# Patient Record
Sex: Female | Born: 1965 | Race: White | Hispanic: No | Marital: Married | Smoking: Never smoker
Health system: Southern US, Community
[De-identification: ages and names within clinical notes are randomized; demographics above are authoritative.]

## PROBLEM LIST (undated history)

## (undated) DIAGNOSIS — E785 Hyperlipidemia, unspecified: Secondary | ICD-10-CM

## (undated) DIAGNOSIS — T4145XA Adverse effect of unspecified anesthetic, initial encounter: Secondary | ICD-10-CM

## (undated) DIAGNOSIS — B999 Unspecified infectious disease: Secondary | ICD-10-CM

## (undated) DIAGNOSIS — I10 Essential (primary) hypertension: Secondary | ICD-10-CM

## (undated) DIAGNOSIS — E119 Type 2 diabetes mellitus without complications: Secondary | ICD-10-CM

## (undated) DIAGNOSIS — M1990 Unspecified osteoarthritis, unspecified site: Secondary | ICD-10-CM

## (undated) DIAGNOSIS — K219 Gastro-esophageal reflux disease without esophagitis: Secondary | ICD-10-CM

## (undated) DIAGNOSIS — R0600 Dyspnea, unspecified: Secondary | ICD-10-CM

## (undated) DIAGNOSIS — D649 Anemia, unspecified: Secondary | ICD-10-CM

## (undated) HISTORY — DX: Essential (primary) hypertension: I10

## (undated) HISTORY — PX: CHOLECYSTECTOMY: SHX55

## (undated) HISTORY — DX: Hyperlipidemia, unspecified: E78.5

## (undated) HISTORY — DX: Type 2 diabetes mellitus without complications: E11.9

## (undated) HISTORY — PX: OTHER SURGICAL HISTORY: SHX169

---

## 1995-08-26 HISTORY — PX: TUBAL LIGATION: SHX77

## 2006-05-26 ENCOUNTER — Emergency Department: Payer: Self-pay | Admitting: Emergency Medicine

## 2007-03-04 ENCOUNTER — Inpatient Hospital Stay: Payer: Self-pay | Admitting: General Surgery

## 2009-05-27 ENCOUNTER — Inpatient Hospital Stay: Payer: Self-pay | Admitting: *Deleted

## 2010-01-21 ENCOUNTER — Emergency Department: Payer: Self-pay | Admitting: Emergency Medicine

## 2010-08-25 DIAGNOSIS — B999 Unspecified infectious disease: Secondary | ICD-10-CM

## 2010-08-25 HISTORY — DX: Unspecified infectious disease: B99.9

## 2011-02-07 ENCOUNTER — Ambulatory Visit: Payer: Self-pay

## 2011-04-22 ENCOUNTER — Ambulatory Visit: Payer: Self-pay | Admitting: Sports Medicine

## 2011-05-12 ENCOUNTER — Ambulatory Visit: Payer: Self-pay | Admitting: Anesthesiology

## 2011-05-14 ENCOUNTER — Ambulatory Visit: Payer: Self-pay | Admitting: Unknown Physician Specialty

## 2012-10-29 ENCOUNTER — Ambulatory Visit: Payer: Self-pay | Admitting: Internal Medicine

## 2012-10-29 LAB — CBC CANCER CENTER
Basophil %: 0.5 %
Eosinophil #: 0.3 x10 3/mm (ref 0.0–0.7)
Eosinophil %: 3.3 %
HGB: 9.7 g/dL — ABNORMAL LOW (ref 12.0–16.0)
Lymphocyte #: 2.8 x10 3/mm (ref 1.0–3.6)
Lymphocyte %: 27.1 %
MCH: 22.6 pg — ABNORMAL LOW (ref 26.0–34.0)
MCHC: 31.8 g/dL — ABNORMAL LOW (ref 32.0–36.0)
MCV: 71 fL — ABNORMAL LOW (ref 80–100)
Monocyte #: 0.6 x10 3/mm (ref 0.2–0.9)
Monocyte %: 6.1 %
Neutrophil #: 6.6 x10 3/mm — ABNORMAL HIGH (ref 1.4–6.5)
Platelet: 528 x10 3/mm — ABNORMAL HIGH (ref 150–440)
RBC: 4.28 10*6/uL (ref 3.80–5.20)
RDW: 16.5 % — ABNORMAL HIGH (ref 11.5–14.5)

## 2012-10-29 LAB — RETICULOCYTES
Absolute Retic Count: 0.0727 10*6/uL
Reticulocyte: 1.7 %

## 2012-10-29 LAB — CALCIUM: Calcium, Total: 8.9 mg/dL (ref 8.5–10.1)

## 2012-11-23 ENCOUNTER — Ambulatory Visit: Payer: Self-pay | Admitting: Internal Medicine

## 2015-08-01 ENCOUNTER — Other Ambulatory Visit: Payer: Self-pay | Admitting: Family Medicine

## 2015-08-31 ENCOUNTER — Other Ambulatory Visit: Payer: Self-pay | Admitting: Family Medicine

## 2015-08-31 ENCOUNTER — Ambulatory Visit: Payer: Self-pay | Admitting: Family Medicine

## 2015-08-31 MED ORDER — METOPROLOL TARTRATE 25 MG PO TABS: 25.0000 mg | ORAL_TABLET | Freq: Two times a day (BID) | ORAL | Status: DC

## 2015-08-31 MED ORDER — METFORMIN HCL ER (OSM) 1000 MG PO TB24: 1000.0000 mg | ORAL_TABLET | Freq: Two times a day (BID) | ORAL | Status: DC

## 2015-08-31 MED ORDER — FLUOXETINE HCL 40 MG PO CAPS: 40.0000 mg | ORAL_CAPSULE | Freq: Every day | ORAL | Status: DC

## 2015-08-31 MED ORDER — GLIMEPIRIDE 4 MG PO TABS: 8.0000 mg | ORAL_TABLET | Freq: Every day | ORAL | Status: DC

## 2015-08-31 MED ORDER — LISINOPRIL 20 MG PO TABS: 20.0000 mg | ORAL_TABLET | Freq: Every day | ORAL | Status: DC

## 2015-09-05 ENCOUNTER — Ambulatory Visit (INDEPENDENT_AMBULATORY_CARE_PROVIDER_SITE_OTHER): Payer: BC Managed Care – PPO | Admitting: Family Medicine

## 2015-09-05 VITALS — BP 122/85 | HR 86 | Temp 98.4°F | Resp 16 | Ht 66.0 in | Wt 198.0 lb

## 2015-09-05 DIAGNOSIS — E1165 Type 2 diabetes mellitus with hyperglycemia: Secondary | ICD-10-CM | POA: Diagnosis not present

## 2015-09-05 DIAGNOSIS — G4700 Insomnia, unspecified: Secondary | ICD-10-CM | POA: Insufficient documentation

## 2015-09-05 DIAGNOSIS — I10 Essential (primary) hypertension: Secondary | ICD-10-CM

## 2015-09-05 DIAGNOSIS — E1142 Type 2 diabetes mellitus with diabetic polyneuropathy: Secondary | ICD-10-CM

## 2015-09-05 DIAGNOSIS — IMO0002 Reserved for concepts with insufficient information to code with codable children: Secondary | ICD-10-CM

## 2015-09-05 DIAGNOSIS — H8110 Benign paroxysmal vertigo, unspecified ear: Secondary | ICD-10-CM | POA: Insufficient documentation

## 2015-09-05 DIAGNOSIS — E1140 Type 2 diabetes mellitus with diabetic neuropathy, unspecified: Secondary | ICD-10-CM | POA: Insufficient documentation

## 2015-09-05 DIAGNOSIS — H8113 Benign paroxysmal vertigo, bilateral: Secondary | ICD-10-CM

## 2015-09-05 LAB — POCT UA - MICROALBUMIN
ALBUMIN/CREATININE RATIO, URINE, POC: 0
CREATININE, POC: 0 mg/dL
Microalbumin Ur, POC: 0 mg/L

## 2015-09-05 LAB — POCT GLYCOSYLATED HEMOGLOBIN (HGB A1C): Hemoglobin A1C: 10.4

## 2015-09-05 MED ORDER — INSULIN DETEMIR 100 UNIT/ML FLEXPEN
10.0000 [IU] | PEN_INJECTOR | Freq: Every day | SUBCUTANEOUS | Status: DC
Start: 2015-09-05 — End: 2015-09-07

## 2015-09-05 MED ORDER — BLOOD GLUCOSE MONITOR KIT: PACK | Status: AC

## 2015-09-05 MED ORDER — MECLIZINE HCL 32 MG PO TABS: 32.0000 mg | ORAL_TABLET | Freq: Three times a day (TID) | ORAL | Status: DC | PRN

## 2015-09-05 MED ORDER — MELATONIN 3 MG PO CAPS: 2.0000 | ORAL_CAPSULE | Freq: Every day | ORAL | Status: DC

## 2015-09-05 MED ORDER — JARDIANCE 25 MG PO TABS: 25.0000 mg | ORAL_TABLET | Freq: Every day | ORAL | Status: DC

## 2015-09-05 MED ORDER — INSULIN PEN NEEDLE 29G X 12.7MM MISC
1.0000 | Freq: Every day | Status: DC
Start: 2015-09-05 — End: 2015-12-18

## 2015-09-05 NOTE — Assessment & Plan Note (Signed)
Controlled. Continue ACE inhibitor for renal protection. Check CMET today.

## 2015-09-05 NOTE — Assessment & Plan Note (Signed)
Symptoms consistent with BPPV. Trial of meclizine PRN for symptoms.

## 2015-09-05 NOTE — Assessment & Plan Note (Signed)
Discussed sleep hygiene. Encouraged pt to try OTC melatonin to see if nighttime awakenings can be improved.  Removed phone, TV, anything with glow from bedroom.

## 2015-09-05 NOTE — Progress Notes (Signed)
Subjective:    Patient ID: Patricia [name], female    DOB: October 08, 1965, 50 y.o.   MRN: [id number]  HPI: Patricia [name] is a 50 y.o. female presenting on 09/05/2015 for Diabetes   HPI  Pt presents for diabetes and hypertension follow-up. Feels pretty good. Occasional dizzy spells with position changes. When she turns her head she feels as though she is on a tilt a whorl. Happens a few times a month. Increased with cold.  Diabetes: Taking metformin twice daily BID.  And Jardiance to control A1c. Does not check sugars. A1c is 10.1 % today. Nothing changed in diet and exercise. Does not go to gym regularly. Does not drink sugar sweetened beverages or sodas.  Hypertension: Controlled. No chest pain, no visual changes,  Does not check at home.  Disordered sleep- Goes to bed at 12 or 1. Wakes up in the middle of the night and cannot fall asleep. Has always had trouble sleeping. Has not tried any OTC sleep aids.    No past medical history on file.  Current Outpatient Prescriptions on File Prior to Visit  Medication Sig  . FLUoxetine (PROZAC) 40 MG capsule Take 1 capsule (40 mg total) by mouth daily.  Marland Kitchen glimepiride (AMARYL) 4 MG tablet Take 2 tablets (8 mg total) by mouth daily with breakfast.  . lisinopril (PRINIVIL,ZESTRIL) 20 MG tablet Take 1 tablet (20 mg total) by mouth daily.  . metformin (FORTAMET) 1000 MG (OSM) 24 hr tablet Take 1 tablet (1,000 mg total) by mouth 2 (two) times daily.  . metoprolol tartrate (LOPRESSOR) 25 MG tablet Take 1 tablet (25 mg total) by mouth 2 (two) times daily.   No current facility-administered medications on file prior to visit.    Review of Systems  Constitutional: Negative for fever and chills.  HENT: Negative.   Respiratory: Negative for cough, chest tightness and wheezing.   Cardiovascular: Negative for chest pain and leg swelling.  Gastrointestinal: Negative for nausea, vomiting, abdominal pain, diarrhea and constipation.  Endocrine: Negative.   Negative for cold intolerance, heat intolerance, polydipsia, polyphagia and polyuria.  Genitourinary: Negative for dysuria and difficulty urinating.  Musculoskeletal: Negative.   Neurological: Positive for dizziness and numbness (with paraesthesias toes. ). Negative for light-headedness.  Psychiatric/Behavioral: Positive for sleep disturbance.   Per HPI unless specifically indicated above     Objective:    BP 122/85 mmHg  Pulse 86  Temp(Src) 98.4 F (36.9 C) (Oral)  Resp 16  Ht '5\' 6"'$  (1.676 m)  Wt 198 lb (89.812 kg)  BMI 31.97 kg/m2  LMP   Wt Readings from Last 3 Encounters:  09/05/15 198 lb (89.812 kg)    Physical Exam  Constitutional: She is oriented to person, place, and time. She appears well-developed and well-nourished.  HENT:  Head: Normocephalic and atraumatic.  Neck: Neck supple.  Cardiovascular: Normal rate, regular rhythm and normal heart sounds.  Exam reveals no gallop and no friction rub.   No murmur heard. Pulmonary/Chest: Effort normal and breath sounds normal. She has no wheezes. She exhibits no tenderness.  Abdominal: Soft. Normal appearance and bowel sounds are normal. She exhibits no distension and no mass. There is no tenderness. There is no rebound and no guarding.  Musculoskeletal: Normal range of motion. She exhibits no edema or tenderness.  Lymphadenopathy:    She has no cervical adenopathy.  Neurological: She is alert and oriented to person, place, and time.  Skin: Skin is warm and dry.  Psychiatric: She has a normal  mood and affect. Her behavior is normal. Thought content normal.   Diabetic Foot Exam - Simple   Simple Foot Form  Diabetic Foot exam was performed with the following findings:  Yes 09/05/2015  9:10 AM  Visual Inspection  No deformities, no ulcerations, no other skin breakdown bilaterally:  Yes  Sensation Testing  Intact to touch and monofilament testing bilaterally:  Yes  Pulse Check  Posterior Tibialis and Dorsalis pulse intact  bilaterally:  Yes  Comments      Results for orders placed or performed in visit on 09/05/15  POCT HgB A1C  Result Value Ref Range   Hemoglobin A1C 10.4       Assessment & Plan:   Problem List Items Addressed This Visit      Cardiovascular and Mediastinum   Hypertension    Controlled. Continue ACE inhibitor for renal protection. Check CMET today.         Endocrine   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 polyneuropathy, without long-term current use of insulin ([place]) - Primary    Uncontrolled. A1c at 10.4% today. Discussed starting insulin with patient. Start Levemir 10 units at bedtime nightly. Pt shown on demo pen. Monitor sugars closely.  Check CMET, Lipids.  Pt should be on statin, will discuss next visit.  UA microalbumin ordered today.  Foot exam done.  Referral for eye exam will be placed next month.      Relevant Medications   Insulin Detemir (LEVEMIR) 100 UNIT/ML Pen   Insulin Pen Needle 29G X 12.7MM MISC   JARDIANCE 25 MG TABS tablet   Other Relevant Orders   POCT HgB A1C (Completed)   POCT UA - Microalbumin   Comprehensive metabolic panel   Lipid panel     Nervous and Auditory   BPPV (benign paroxysmal positional vertigo)    Symptoms consistent with BPPV. Trial of meclizine PRN for symptoms.         Other   Insomnia    Discussed sleep hygiene. Encouraged pt to try OTC melatonin to see if nighttime awakenings can be improved.  Removed phone, TV, anything with glow from bedroom.          Meds ordered this encounter  Medications  . DISCONTD: JARDIANCE 25 MG TABS tablet    Sig:   . Insulin Detemir (LEVEMIR) 100 UNIT/ML Pen    Sig: Inject 10 Units into the skin daily at 10 pm.    Dispense:  15 mL    Refill:  11    Order Specific Question:  Supervising Provider    Answer:  Arlis Porta [phone]  . Insulin Pen Needle 29G X 12.7MM MISC    Sig: 1 each by Does not apply route daily.    Dispense:  90 each    Refill:  3    Order  Specific Question:  Supervising Provider    Answer:  Arlis Porta [phone]  . blood glucose meter kit and supplies KIT    Sig: Dispense based on patient and insurance preference. Use up to four times daily as directed. (FOR ICD-9 250.00, 250.01).    Dispense:  1 each    Refill:  0    Order Specific Question:  Supervising Provider    Answer:  Arlis Porta [[id number]]    Order Specific Question:  Number of strips    Answer:  80    Order Specific Question:  Number of lancets    Answer:  90  .  meclizine (ANTIVERT) 32 MG tablet    Sig: Take 1 tablet (32 mg total) by mouth 3 (three) times daily as needed.    Dispense:  30 tablet    Refill:  0    Order Specific Question:  Supervising Provider    Answer:  Arlis Porta [phone]  . Melatonin 3 MG CAPS    Sig: Take 2 capsules (6 mg total) by mouth daily.    Refill:  0    Order Specific Question:  Supervising Provider    Answer:  Arlis Porta [[id number]]  . JARDIANCE 25 MG TABS tablet    Sig: Take 25 mg by mouth daily.    Dispense:  30 tablet    Refill:  11    Order Specific Question:  Supervising Provider    Answer:  Arlis Porta [phone]      Follow up plan: Return in about 4 weeks (around 10/03/2015) for diabetes.Marland Kitchen

## 2015-09-05 NOTE — Patient Instructions (Signed)
We need to start you on insulin today for your diabetes. We will start with levemir injections at bedtime- take 10 units under the skin. Ensure you are eating regular meals. We will also [facility] for a diabetes refresher since you are starting on insuling.   Please check your blood glucose once times daily. If your glucose is < 70 mg/dl or you have symptoms of hypoglycemia confusion, dizziness, headache, hunger, jitteriness and sweating please drink 4 oz of juice or soda.  Check blood glucose 15 minutes later. If it has not risen to >100, please seek medical attention. If > 100 please eat a snack containing protein such as peanut butter and crackers.   For sleep try melatonin OTC 6-10 mg at bedtime.

## 2015-09-05 NOTE — Assessment & Plan Note (Signed)
Uncontrolled. A1c at 10.4% today. Discussed starting insulin with patient. Start Levemir 10 units at bedtime nightly. Pt shown on demo pen. Monitor sugars closely.  Check CMET, Lipids.  Pt should be on statin, will discuss next visit.  UA microalbumin ordered today.  Foot exam done.  Referral for eye exam will be placed next month.

## 2015-09-06 LAB — LIPID PANEL
CHOL/HDL RATIO: 3.8 ratio (ref 0.0–4.4)
Cholesterol, Total: 218 mg/dL — ABNORMAL HIGH (ref 100–199)
HDL: 57 mg/dL (ref >39)
LDL Calculated: 114 mg/dL — ABNORMAL HIGH (ref 0–99)
Triglycerides: 234 mg/dL — ABNORMAL HIGH (ref 0–149)
VLDL CHOLESTEROL CAL: 47 mg/dL — AB (ref 5–40)

## 2015-09-06 LAB — COMPREHENSIVE METABOLIC PANEL
A/G RATIO: 1.6 (ref 1.1–2.5)
ALBUMIN: 4.3 g/dL (ref 3.5–5.5)
ALT: 33 IU/L — AB (ref 0–32)
AST: 22 IU/L (ref 0–40)
Alkaline Phosphatase: 99 IU/L (ref 39–117)
BILIRUBIN TOTAL: 0.2 mg/dL (ref 0.0–1.2)
BUN / CREAT RATIO: 15 (ref 9–23)
BUN: 12 mg/dL (ref 6–24)
CALCIUM: 9.8 mg/dL (ref 8.7–10.2)
CHLORIDE: 95 mmol/L — AB (ref 96–106)
CO2: 23 mmol/L (ref 18–29)
Creatinine, Ser: 0.78 mg/dL (ref 0.57–1.00)
GFR, EST AFRICAN AMERICAN: 102 mL/min/{1.73_m2} (ref >59)
GFR, EST NON AFRICAN AMERICAN: 88 mL/min/{1.73_m2} (ref >59)
GLUCOSE: 232 mg/dL — AB (ref 65–99)
Globulin, Total: 2.7 g/dL (ref 1.5–4.5)
Potassium: 5.4 mmol/L — ABNORMAL HIGH (ref 3.5–5.2)
Sodium: 137 mmol/L (ref 134–144)
TOTAL PROTEIN: 7 g/dL (ref 6.0–8.5)

## 2015-09-07 ENCOUNTER — Telehealth: Payer: Self-pay | Admitting: Family Medicine

## 2015-09-07 ENCOUNTER — Other Ambulatory Visit: Payer: Self-pay | Admitting: Family Medicine

## 2015-09-07 DIAGNOSIS — IMO0002 Reserved for concepts with insufficient information to code with codable children: Secondary | ICD-10-CM

## 2015-09-07 DIAGNOSIS — E1165 Type 2 diabetes mellitus with hyperglycemia: Principal | ICD-10-CM

## 2015-09-07 DIAGNOSIS — E1142 Type 2 diabetes mellitus with diabetic polyneuropathy: Secondary | ICD-10-CM

## 2015-09-07 MED ORDER — INSULIN GLARGINE 300 UNIT/ML ~~LOC~~ SOPN: 10.0000 [IU] | PEN_INJECTOR | Freq: Every day | SUBCUTANEOUS | Status: DC

## 2015-09-07 NOTE — Telephone Encounter (Signed)
Pt has a coupon for $10 Toujeo co-pay would like to switch her insulin.

## 2015-09-11 ENCOUNTER — Other Ambulatory Visit: Payer: Self-pay | Admitting: Family Medicine

## 2015-09-11 MED ORDER — FUROSEMIDE 20 MG PO TABS: 10.0000 mg | ORAL_TABLET | Freq: Every day | ORAL | Status: DC

## 2015-09-19 ENCOUNTER — Telehealth: Payer: Self-pay | Admitting: Family Medicine

## 2015-09-19 NOTE — Telephone Encounter (Signed)
Called patient back to confirm meds and bs readings. Patient taking Metformin  1 bid; Jardiance 25 mg 1-qd; Toujeo 10 units qhs. 09/16/15 am- 286 pm 260 09/17/15  am- 260 pm 362 09/17/15  Am 305  pm 336 09/19/15  Am 225 Please advise.

## 2015-09-19 NOTE — Telephone Encounter (Signed)
Patient aware of direction and verbalizes understanding.

## 2015-09-19 NOTE — Telephone Encounter (Signed)
Increase Toujeo to 12 units nightly tonight; Increase by 2 units at bedtime for every for an AM (fasting blood sugar) of > 150. For exam if tomorrrow's fasting sugar is 220- increase up to 14 units of Toujeo that night. We will check in on her sugars at her 10/05/2015 appt.  Thanks! AK

## 2015-09-19 NOTE — Telephone Encounter (Signed)
Pt said her blood sugar has been running for 250 to 350 and asked if she needed to increase medication.  Please call [phone]

## 2015-10-05 ENCOUNTER — Encounter: Payer: Self-pay | Admitting: Family Medicine

## 2015-10-05 ENCOUNTER — Ambulatory Visit (INDEPENDENT_AMBULATORY_CARE_PROVIDER_SITE_OTHER): Payer: BC Managed Care – PPO | Admitting: Family Medicine

## 2015-10-05 VITALS — BP 101/72 | HR 96 | Temp 97.8°F | Resp 16 | Ht 66.0 in | Wt 199.9 lb

## 2015-10-05 DIAGNOSIS — E875 Hyperkalemia: Secondary | ICD-10-CM | POA: Diagnosis not present

## 2015-10-05 DIAGNOSIS — E1142 Type 2 diabetes mellitus with diabetic polyneuropathy: Secondary | ICD-10-CM

## 2015-10-05 DIAGNOSIS — IMO0002 Reserved for concepts with insufficient information to code with codable children: Secondary | ICD-10-CM

## 2015-10-05 DIAGNOSIS — E1165 Type 2 diabetes mellitus with hyperglycemia: Secondary | ICD-10-CM

## 2015-10-05 DIAGNOSIS — R740 Nonspecific elevation of levels of transaminase and lactic acid dehydrogenase [LDH]: Secondary | ICD-10-CM | POA: Diagnosis not present

## 2015-10-05 DIAGNOSIS — R7401 Elevation of levels of liver transaminase levels: Secondary | ICD-10-CM

## 2015-10-05 NOTE — Patient Instructions (Addendum)
Diabetes:  We will have you seen by the diabetes educators at the hospital for help with your diet. Titrate Toujeo 2 units every 7 days for fasting blood sugar >130.   STOP Jardiance. Test blood sugar 2 hours after a meal.   Alpha Lipoic Acid-  once daily to help with feet burning.

## 2015-10-05 NOTE — Assessment & Plan Note (Addendum)
Titrate Toujeo 2 units once weekly with goal of FBG <130. Encouraged healthy eating and carb counting. [facility] to [facility] for diabetic re-education. Cotinue to check sugars twice daily.  Hypoglycemia protocol reviewed. Eye exam next month.  Plan to check A1c in 2 mos. Consider switching to [facility] to better to control diabetes.  Suggested gabapentin for feet numbness. Pt declined. Pt will look into alpha lipoic acid.

## 2015-10-05 NOTE — Progress Notes (Signed)
Subjective:    Patient ID: Patricia [name], female    DOB: 10-Jan-1966, 50 y.o.   MRN: [id number]  HPI: Patricia [name] is a 50 y.o. female presenting on 10/05/2015 for Diabetes   HPI  Pt presents for diabetes follow-up.  She was started [employer]. Titrating 2 units per day. Up to 24 units daily.  Avg is 237. Highest of 430 and lowest of 141.  Taking Toujeo at bedtime. Pt evening sugars are high- not testing 2 hours after meals.  Feet are numb and tingling. Painful when walking barefoot. No visual changes. No hypoglycemia.  She is a picky eater and has trouble making food choices.   Past Medical History  Diagnosis Date  . Diabetes mellitus without complication ([place])   . Hypertension   . Hyperlipidemia     Current Outpatient Prescriptions on File Prior to Visit  Medication Sig  . blood glucose meter kit and supplies KIT Dispense based on patient and insurance preference. Use up to four times daily as directed. (FOR ICD-9 250.00, 250.01).  Marland Kitchen FLUoxetine (PROZAC) 40 MG capsule Take 1 capsule (40 mg total) by mouth daily.  . furosemide (LASIX) 20 MG tablet Take 0.5 tablets (10 mg total) by mouth daily. For 1 week.  Marland Kitchen glimepiride (AMARYL) 4 MG tablet Take 2 tablets (8 mg total) by mouth daily with breakfast.  . Insulin Glargine (TOUJEO SOLOSTAR) 300 UNIT/ML SOPN Inject 10 Units into the skin at bedtime.  . Insulin Pen Needle 29G X 12.7MM MISC 1 each by Does not apply route daily.  Marland Kitchen lisinopril (PRINIVIL,ZESTRIL) 20 MG tablet Take 1 tablet (20 mg total) by mouth daily.  . meclizine (ANTIVERT) 32 MG tablet Take 1 tablet (32 mg total) by mouth 3 (three) times daily as needed.  . Melatonin 3 MG CAPS Take 2 capsules (6 mg total) by mouth daily.  . metformin (FORTAMET) 1000 MG (OSM) 24 hr tablet Take 1 tablet (1,000 mg total) by mouth 2 (two) times daily.  . metoprolol tartrate (LOPRESSOR) 25 MG tablet Take 1 tablet (25 mg total) by mouth 2 (two) times daily.   No current facility-administered  medications on file prior to visit.    Review of Systems  Constitutional: Negative for fever and chills.  HENT: Negative.   Respiratory: Negative for cough, chest tightness and wheezing.   Cardiovascular: Negative for chest pain and leg swelling.  Gastrointestinal: Negative for nausea, vomiting, abdominal pain, diarrhea and constipation.  Endocrine: Negative.  Negative for cold intolerance, heat intolerance, polydipsia, polyphagia and polyuria.  Genitourinary: Negative for dysuria and difficulty urinating.  Musculoskeletal: Negative.   Neurological: Positive for numbness (bilateral feet). Negative for dizziness and light-headedness.  Psychiatric/Behavioral: Negative.    Per HPI unless specifically indicated above     Objective:    BP 101/72 mmHg  Pulse 96  Temp(Src) 97.8 F (36.6 C) (Oral)  Resp 16  Ht '5\' 6"'$  (1.676 m)  Wt 199 lb 14.4 oz (90.674 kg)  BMI 32.28 kg/m2  Wt Readings from Last 3 Encounters:  10/05/15 199 lb 14.4 oz (90.674 kg)  09/05/15 198 lb (89.812 kg)    Physical Exam  Constitutional: She is oriented to person, place, and time. She appears well-developed and well-nourished.  HENT:  Head: Normocephalic and atraumatic.  Neck: Neck supple.  Cardiovascular: Normal rate, regular rhythm and normal heart sounds.  Exam reveals no gallop and no friction rub.   No murmur heard. Pulmonary/Chest: Effort normal and breath sounds normal. She has no wheezes.  She exhibits no tenderness.  Abdominal: Soft. Normal appearance and bowel sounds are normal. She exhibits no distension and no mass. There is no tenderness. There is no rebound and no guarding.  Musculoskeletal: Normal range of motion. She exhibits no edema or tenderness.  Lymphadenopathy:    She has no cervical adenopathy.  Neurological: She is alert and oriented to person, place, and time.  Skin: Skin is warm and dry.   Results for orders placed or performed in visit on 09/05/15  Comprehensive metabolic panel    Result Value Ref Range   Glucose 232 (H) 65 - 99 mg/dL   BUN 12 6 - 24 mg/dL   Creatinine, Ser 6.12 0.57 - 1.00 mg/dL   GFR calc non Af Amer 88 >59 mL/min/1.73   GFR calc Af Amer 102 >59 mL/min/1.73   BUN/Creatinine Ratio 15 9 - 23   Sodium 137 134 - 144 mmol/L   Potassium 5.4 (H) 3.5 - 5.2 mmol/L   Chloride 95 (L) 96 - 106 mmol/L   CO2 23 18 - 29 mmol/L   Calcium 9.8 8.7 - 10.2 mg/dL   Total Protein 7.0 6.0 - 8.5 g/dL   Albumin 4.3 3.5 - 5.5 g/dL   Globulin, Total 2.7 1.5 - 4.5 g/dL   Albumin/Globulin Ratio 1.6 1.1 - 2.5   Bilirubin Total 0.2 0.0 - 1.2 mg/dL   Alkaline Phosphatase 99 39 - 117 IU/L   AST 22 0 - 40 IU/L   ALT 33 (H) 0 - 32 IU/L  Lipid panel  Result Value Ref Range   Cholesterol, Total 218 (H) 100 - 199 mg/dL   Triglycerides 548 (H) 0 - 149 mg/dL   HDL 57 >32 mg/dL   VLDL Cholesterol Cal 47 (H) 5 - 40 mg/dL   LDL Calculated 346 (H) 0 - 99 mg/dL   Chol/HDL Ratio 3.8 0.0 - 4.4 ratio units  POCT HgB A1C  Result Value Ref Range   Hemoglobin A1C 10.4   POCT UA - Microalbumin  Result Value Ref Range   Microalbumin Ur, POC 0 mg/L   Creatinine, POC 0 mg/dL   Albumin/Creatinine Ratio, Urine, POC 0       Assessment & Plan:   Problem List Items Addressed This Visit      Endocrine   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 polyneuropathy, without long-term current use of insulin ([facility])    Titrate Toujeo 2 units once weekly with goal of FBG <130. Encouraged healthy eating and carb counting. [facility] to [facility] for diabetic re-education. Cotinue to check sugars twice daily.  Hypoglycemia protocol reviewed. Eye exam next month.  Plan to check A1c in 2 mos. Consider switching to [facility] to better to control diabetes.  Suggested gabapentin for feet numbness. Pt declined. Pt will look into alpha lipoic acid.        Other Visit Diagnoses    Hyperkalemia    -  Primary    Recheck from previous labs.     Relevant Orders    Comprehensive metabolic  panel    Elevated transaminase level        Recheck from previous labs.     Relevant Orders    Comprehensive metabolic panel       Meds ordered this encounter  Medications  . ONE TOUCH ULTRA TEST test strip    Sig:   . ONETOUCH DELICA LANCETS 33G MISC    Sig:       Follow up plan: No Follow-up on file.

## 2015-10-07 ENCOUNTER — Other Ambulatory Visit: Payer: Self-pay | Admitting: Family Medicine

## 2015-10-24 DIAGNOSIS — M25561 Pain in right knee: Secondary | ICD-10-CM | POA: Insufficient documentation

## 2015-10-30 ENCOUNTER — Other Ambulatory Visit: Payer: Self-pay | Admitting: Unknown Physician Specialty

## 2015-10-30 DIAGNOSIS — M1711 Unilateral primary osteoarthritis, right knee: Secondary | ICD-10-CM

## 2015-11-02 ENCOUNTER — Ambulatory Visit
Admission: RE | Admit: 2015-11-02 | Discharge: 2015-11-02 | Disposition: A | Discharge status: Home / Self Care | Payer: BC Managed Care – PPO | Source: Ambulatory Visit | Attending: Unknown Physician Specialty | Admitting: Unknown Physician Specialty

## 2015-11-02 DIAGNOSIS — M25461 Effusion, right knee: Secondary | ICD-10-CM | POA: Diagnosis not present

## 2015-11-02 DIAGNOSIS — M2410 Other articular cartilage disorders, unspecified site: Secondary | ICD-10-CM | POA: Diagnosis not present

## 2015-11-02 DIAGNOSIS — X58XXXA Exposure to other specified factors, initial encounter: Secondary | ICD-10-CM | POA: Diagnosis not present

## 2015-11-02 DIAGNOSIS — M1711 Unilateral primary osteoarthritis, right knee: Secondary | ICD-10-CM | POA: Insufficient documentation

## 2015-11-02 DIAGNOSIS — S83241A Other tear of medial meniscus, current injury, right knee, initial encounter: Secondary | ICD-10-CM | POA: Insufficient documentation

## 2015-11-09 ENCOUNTER — Telehealth: Payer: Self-pay | Admitting: Family Medicine

## 2015-11-09 NOTE — Telephone Encounter (Signed)
Pt needs a prior authorization for toujeo.  She has [employer] [phone].  Pt's call back is [phone]

## 2015-11-09 NOTE — Telephone Encounter (Signed)
Pt advised that insurance or pharmacy should send us the prior Auth request.

## 2015-11-12 NOTE — Telephone Encounter (Signed)
PA  Submitted from [email or web address]

## 2015-11-19 ENCOUNTER — Telehealth: Payer: Self-pay | Admitting: Family Medicine

## 2015-11-19 NOTE — Telephone Encounter (Signed)
Called pharmacy regarding Toujeo co-pay card. Per Toujeo rep,  The pharmacy should ring it up code [phone] to bypass the PA and allow her to use insurance card. [facility]. If this does not work, she should call the number on the back of co-pay card and the [employer] staff will help her. Can you please let her know this information? Thanks! AK

## 2015-11-20 NOTE — Telephone Encounter (Signed)
Called daughter to tell pt to call office, her number doesn't have VM set up.

## 2015-11-21 NOTE — Telephone Encounter (Signed)
Pt advised.

## 2015-11-28 ENCOUNTER — Other Ambulatory Visit
Admission: RE | Admit: 2015-11-28 | Discharge: 2015-11-28 | Disposition: A | Discharge status: Home / Self Care | Payer: BC Managed Care – PPO | Source: Ambulatory Visit | Attending: Unknown Physician Specialty | Admitting: Unknown Physician Specialty

## 2015-11-28 DIAGNOSIS — M1711 Unilateral primary osteoarthritis, right knee: Secondary | ICD-10-CM | POA: Diagnosis present

## 2015-11-28 LAB — SYNOVIAL CELL COUNT + DIFF, W/ CRYSTALS
CRYSTALS FLUID: NONE SEEN
Eosinophils-Synovial: 0 %
LYMPHOCYTES-SYNOVIAL FLD: 3 %
Monocyte-Macrophage-Synovial Fluid: 2 %
NEUTROPHIL, SYNOVIAL: 95 %
Other Cells-SYN: 0
WBC, SYNOVIAL: 32266 /mm3 — AB (ref 0–200)

## 2015-11-30 ENCOUNTER — Inpatient Hospital Stay
Admission: AD | Admit: 2015-11-30 | Discharge: 2015-12-03 | DRG: 485 | Disposition: A | Discharge status: Home Health | Payer: BC Managed Care – PPO | Source: Ambulatory Visit | Attending: Internal Medicine | Admitting: Internal Medicine

## 2015-11-30 ENCOUNTER — Inpatient Hospital Stay: Payer: BC Managed Care – PPO | Admitting: Registered Nurse

## 2015-11-30 ENCOUNTER — Encounter
Admission: AD | Disposition: A | Discharge status: Home Health | Payer: Self-pay | Source: Ambulatory Visit | Attending: Internal Medicine

## 2015-11-30 ENCOUNTER — Encounter: Payer: Self-pay | Admitting: *Deleted

## 2015-11-30 ENCOUNTER — Inpatient Hospital Stay: Payer: BC Managed Care – PPO

## 2015-11-30 DIAGNOSIS — Z8614 Personal history of Methicillin resistant Staphylococcus aureus infection: Secondary | ICD-10-CM

## 2015-11-30 DIAGNOSIS — T17908A Unspecified foreign body in respiratory tract, part unspecified causing other injury, initial encounter: Secondary | ICD-10-CM

## 2015-11-30 DIAGNOSIS — B955 Unspecified streptococcus as the cause of diseases classified elsewhere: Secondary | ICD-10-CM | POA: Diagnosis present

## 2015-11-30 DIAGNOSIS — M00261 Other streptococcal arthritis, right knee: Principal | ICD-10-CM | POA: Diagnosis present

## 2015-11-30 DIAGNOSIS — Z79899 Other long term (current) drug therapy: Secondary | ICD-10-CM

## 2015-11-30 DIAGNOSIS — H8110 Benign paroxysmal vertigo, unspecified ear: Secondary | ICD-10-CM | POA: Diagnosis present

## 2015-11-30 DIAGNOSIS — Z794 Long term (current) use of insulin: Secondary | ICD-10-CM

## 2015-11-30 DIAGNOSIS — M1711 Unilateral primary osteoarthritis, right knee: Secondary | ICD-10-CM | POA: Diagnosis present

## 2015-11-30 DIAGNOSIS — J690 Pneumonitis due to inhalation of food and vomit: Secondary | ICD-10-CM | POA: Diagnosis not present

## 2015-11-30 DIAGNOSIS — E785 Hyperlipidemia, unspecified: Secondary | ICD-10-CM | POA: Diagnosis present

## 2015-11-30 DIAGNOSIS — R0602 Shortness of breath: Secondary | ICD-10-CM

## 2015-11-30 DIAGNOSIS — Z7982 Long term (current) use of aspirin: Secondary | ICD-10-CM | POA: Diagnosis not present

## 2015-11-30 DIAGNOSIS — G4700 Insomnia, unspecified: Secondary | ICD-10-CM | POA: Diagnosis present

## 2015-11-30 DIAGNOSIS — Z452 Encounter for adjustment and management of vascular access device: Secondary | ICD-10-CM

## 2015-11-30 DIAGNOSIS — M25561 Pain in right knee: Secondary | ICD-10-CM | POA: Diagnosis present

## 2015-11-30 DIAGNOSIS — D510 Vitamin B12 deficiency anemia due to intrinsic factor deficiency: Secondary | ICD-10-CM | POA: Diagnosis present

## 2015-11-30 DIAGNOSIS — E1142 Type 2 diabetes mellitus with diabetic polyneuropathy: Secondary | ICD-10-CM | POA: Diagnosis present

## 2015-11-30 DIAGNOSIS — I10 Essential (primary) hypertension: Secondary | ICD-10-CM | POA: Diagnosis present

## 2015-11-30 DIAGNOSIS — F329 Major depressive disorder, single episode, unspecified: Secondary | ICD-10-CM | POA: Diagnosis present

## 2015-11-30 DIAGNOSIS — E1165 Type 2 diabetes mellitus with hyperglycemia: Secondary | ICD-10-CM | POA: Diagnosis present

## 2015-11-30 DIAGNOSIS — B950 Streptococcus, group A, as the cause of diseases classified elsewhere: Secondary | ICD-10-CM | POA: Diagnosis present

## 2015-11-30 DIAGNOSIS — R0902 Hypoxemia: Secondary | ICD-10-CM | POA: Diagnosis not present

## 2015-11-30 DIAGNOSIS — M00869 Arthritis due to other bacteria, unspecified knee: Secondary | ICD-10-CM | POA: Diagnosis not present

## 2015-11-30 DIAGNOSIS — Z8739 Personal history of other diseases of the musculoskeletal system and connective tissue: Secondary | ICD-10-CM | POA: Diagnosis present

## 2015-11-30 HISTORY — PX: KNEE ARTHROSCOPY: SHX127

## 2015-11-30 HISTORY — DX: Anemia, unspecified: D64.9

## 2015-11-30 LAB — BASIC METABOLIC PANEL
ANION GAP: 8 (ref 5–15)
BUN: 26 mg/dL — ABNORMAL HIGH (ref 6–20)
CALCIUM: 9.7 mg/dL (ref 8.9–10.3)
CO2: 24 mmol/L (ref 22–32)
Chloride: 105 mmol/L (ref 101–111)
Creatinine, Ser: 0.8 mg/dL (ref 0.44–1.00)
GFR calc Af Amer: >60 mL/min (ref >60)
GFR calc non Af Amer: >60 mL/min (ref >60)
GLUCOSE: 93 mg/dL (ref 65–99)
POTASSIUM: 4.4 mmol/L (ref 3.5–5.1)
Sodium: 137 mmol/L (ref 135–145)

## 2015-11-30 LAB — CBC
HEMATOCRIT: 34.2 % — AB (ref 35.0–47.0)
HEMOGLOBIN: 11.5 g/dL — AB (ref 12.0–16.0)
MCH: 28.9 pg (ref 26.0–34.0)
MCHC: 33.8 g/dL (ref 32.0–36.0)
MCV: 85.4 fL (ref 80.0–100.0)
Platelets: 426 10*3/uL (ref 150–440)
RBC: 4 MIL/uL (ref 3.80–5.20)
RDW: 14.2 % (ref 11.5–14.5)
WBC: 9.6 10*3/uL (ref 3.6–11.0)

## 2015-11-30 LAB — SEDIMENTATION RATE: Sed Rate: 50 mm/hr — ABNORMAL HIGH (ref 0–30)

## 2015-11-30 LAB — SURGICAL PCR SCREEN
MRSA, PCR: NEGATIVE
Staphylococcus aureus: NEGATIVE

## 2015-11-30 LAB — GLUCOSE, CAPILLARY
GLUCOSE-CAPILLARY: 174 mg/dL — AB (ref 65–99)
GLUCOSE-CAPILLARY: 213 mg/dL — AB (ref 65–99)
Glucose-Capillary: 92 mg/dL (ref 65–99)

## 2015-11-30 SURGERY — ARTHROSCOPY, KNEE
Anesthesia: General | Site: Knee | Laterality: Right | Wound class: Contaminated

## 2015-11-30 MED ORDER — ONDANSETRON HCL 4 MG PO TABS: 4.0000 mg | ORAL_TABLET | Freq: Four times a day (QID) | ORAL | Status: DC | PRN

## 2015-11-30 MED ORDER — MAGNESIUM CITRATE PO SOLN
1.0000 | Freq: Once | ORAL | Status: DC | PRN
  Filled 2015-11-30: qty 296

## 2015-11-30 MED ORDER — PIPERACILLIN-TAZOBACTAM 3.375 G IVPB
3.3750 g | Freq: Three times a day (TID) | INTRAVENOUS | Status: DC
  Administered 2015-11-30 – 2015-12-01 (×3): 3.375 g via INTRAVENOUS
  Filled 2015-11-30 (×4): qty 50

## 2015-11-30 MED ORDER — INSULIN GLARGINE 100 UNIT/ML ~~LOC~~ SOLN
10.0000 [IU] | Freq: Every day | SUBCUTANEOUS | Status: DC
  Administered 2015-11-30 – 2015-12-02 (×3): 10 [IU] via SUBCUTANEOUS
  Filled 2015-11-30 (×4): qty 0.1

## 2015-11-30 MED ORDER — ONDANSETRON HCL 4 MG/2ML IJ SOLN
INTRAMUSCULAR | Status: DC | PRN
  Administered 2015-11-30 (×2): 4 mg via INTRAVENOUS

## 2015-11-30 MED ORDER — BISACODYL 10 MG RE SUPP: 10.0000 mg | Freq: Every day | RECTAL | Status: DC | PRN

## 2015-11-30 MED ORDER — MORPHINE SULFATE (PF) 2 MG/ML IV SOLN
2.0000 mg | INTRAVENOUS | Status: DC | PRN
  Administered 2015-11-30 – 2015-12-02 (×2): 2 mg via INTRAVENOUS
  Filled 2015-11-30 (×2): qty 1

## 2015-11-30 MED ORDER — PHENYLEPHRINE HCL 10 MG/ML IJ SOLN
INTRAMUSCULAR | Status: DC | PRN
  Administered 2015-11-30 (×2): 200 ug via INTRAVENOUS
  Administered 2015-11-30 (×3): 100 ug via INTRAVENOUS

## 2015-11-30 MED ORDER — FLUOXETINE HCL 20 MG PO CAPS
40.0000 mg | ORAL_CAPSULE | Freq: Every day | ORAL | Status: DC
  Administered 2015-12-01 – 2015-12-03 (×3): 40 mg via ORAL
  Filled 2015-11-30 (×3): qty 2

## 2015-11-30 MED ORDER — SODIUM CHLORIDE 0.9 % IV SOLN: INTRAVENOUS | Status: DC

## 2015-11-30 MED ORDER — INSULIN ASPART 100 UNIT/ML ~~LOC~~ SOLN: 0.0000 [IU] | Freq: Three times a day (TID) | SUBCUTANEOUS | Status: DC

## 2015-11-30 MED ORDER — ZOLPIDEM TARTRATE 5 MG PO TABS
5.0000 mg | ORAL_TABLET | Freq: Every evening | ORAL | Status: DC | PRN
  Administered 2015-12-01 – 2015-12-02 (×2): 5 mg via ORAL
  Filled 2015-11-30 (×2): qty 1

## 2015-11-30 MED ORDER — SUCCINYLCHOLINE CHLORIDE 20 MG/ML IJ SOLN
INTRAMUSCULAR | Status: DC | PRN
  Administered 2015-11-30: 100 mg via INTRAVENOUS

## 2015-11-30 MED ORDER — SODIUM CHLORIDE 0.9 % IJ SOLN
INTRAMUSCULAR | Status: AC
  Filled 2015-11-30: qty 10

## 2015-11-30 MED ORDER — INSULIN ASPART 100 UNIT/ML ~~LOC~~ SOLN
0.0000 [IU] | Freq: Three times a day (TID) | SUBCUTANEOUS | Status: DC
  Administered 2015-12-01: 2 [IU] via SUBCUTANEOUS
  Administered 2015-12-01 (×2): 3 [IU] via SUBCUTANEOUS
  Administered 2015-12-02: 2 [IU] via SUBCUTANEOUS
  Administered 2015-12-02 – 2015-12-03 (×2): 3 [IU] via SUBCUTANEOUS
  Administered 2015-12-03: 8 [IU] via SUBCUTANEOUS
  Filled 2015-11-30 (×2): qty 3
  Filled 2015-11-30 (×2): qty 2
  Filled 2015-11-30: qty 8
  Filled 2015-11-30 (×2): qty 3

## 2015-11-30 MED ORDER — BUPIVACAINE-EPINEPHRINE (PF) 0.5% -1:200000 IJ SOLN
INTRAMUSCULAR | Status: DC | PRN
  Administered 2015-11-30: 30 mL

## 2015-11-30 MED ORDER — IPRATROPIUM-ALBUTEROL 0.5-2.5 (3) MG/3ML IN SOLN
RESPIRATORY_TRACT | Status: AC
  Administered 2015-11-30: 3 mL via RESPIRATORY_TRACT
  Filled 2015-11-30: qty 3

## 2015-11-30 MED ORDER — METOCLOPRAMIDE HCL 5 MG/ML IJ SOLN
5.0000 mg | Freq: Three times a day (TID) | INTRAMUSCULAR | Status: DC | PRN
Start: 2015-11-30 — End: 2015-12-03

## 2015-11-30 MED ORDER — METOCLOPRAMIDE HCL 10 MG PO TABS: 5.0000 mg | ORAL_TABLET | Freq: Three times a day (TID) | ORAL | Status: DC | PRN

## 2015-11-30 MED ORDER — ASPIRIN EC 81 MG PO TBEC
81.0000 mg | DELAYED_RELEASE_TABLET | Freq: Every day | ORAL | Status: DC
  Administered 2015-12-01 – 2015-12-03 (×3): 81 mg via ORAL
  Filled 2015-11-30 (×3): qty 1

## 2015-11-30 MED ORDER — ONDANSETRON HCL 4 MG/2ML IJ SOLN
4.0000 mg | Freq: Four times a day (QID) | INTRAMUSCULAR | Status: DC | PRN
  Administered 2015-12-02: 4 mg via INTRAVENOUS
  Filled 2015-11-30: qty 2

## 2015-11-30 MED ORDER — MIDAZOLAM HCL 2 MG/2ML IJ SOLN
INTRAMUSCULAR | Status: DC | PRN
  Administered 2015-11-30: 2 mg via INTRAVENOUS

## 2015-11-30 MED ORDER — ALBUTEROL SULFATE HFA 108 (90 BASE) MCG/ACT IN AERS
INHALATION_SPRAY | RESPIRATORY_TRACT | Status: DC | PRN
  Administered 2015-11-30: 4 via RESPIRATORY_TRACT
  Administered 2015-11-30: 8 via RESPIRATORY_TRACT

## 2015-11-30 MED ORDER — BUPIVACAINE-EPINEPHRINE (PF) 0.5% -1:200000 IJ SOLN
INTRAMUSCULAR | Status: AC
  Filled 2015-11-30: qty 30

## 2015-11-30 MED ORDER — SUGAMMADEX SODIUM 200 MG/2ML IV SOLN
INTRAVENOUS | Status: DC | PRN
  Administered 2015-11-30: 200 mg via INTRAVENOUS

## 2015-11-30 MED ORDER — DOCUSATE SODIUM 100 MG PO CAPS
100.0000 mg | ORAL_CAPSULE | Freq: Two times a day (BID) | ORAL | Status: DC
Start: 2015-11-30 — End: 2015-12-03
  Administered 2015-11-30 – 2015-12-03 (×6): 100 mg via ORAL
  Filled 2015-11-30 (×6): qty 1

## 2015-11-30 MED ORDER — DEXTROSE 5 % IV SOLN
500.0000 mg | Freq: Four times a day (QID) | INTRAVENOUS | Status: DC | PRN
  Filled 2015-11-30: qty 5

## 2015-11-30 MED ORDER — OXYCODONE HCL 5 MG PO TABS
5.0000 mg | ORAL_TABLET | ORAL | Status: DC | PRN
  Administered 2015-11-30 – 2015-12-01 (×4): 5 mg via ORAL
  Administered 2015-12-01: 10 mg via ORAL
  Administered 2015-12-02 – 2015-12-03 (×6): 5 mg via ORAL
  Filled 2015-11-30 (×5): qty 1
  Filled 2015-11-30: qty 2
  Filled 2015-11-30 (×5): qty 1

## 2015-11-30 MED ORDER — VANCOMYCIN HCL 500 MG IV SOLR
500.0000 mg | Freq: Once | INTRAVENOUS | Status: AC
  Administered 2015-11-30: 500 mg via INTRAVENOUS
  Filled 2015-11-30: qty 500

## 2015-11-30 MED ORDER — METHOCARBAMOL 500 MG PO TABS: 500.0000 mg | ORAL_TABLET | Freq: Four times a day (QID) | ORAL | Status: DC | PRN

## 2015-11-30 MED ORDER — SODIUM CHLORIDE 0.9 % IV SOLN
INTRAVENOUS | Status: DC
  Administered 2015-11-30: 18:00:00 via INTRAVENOUS

## 2015-11-30 MED ORDER — DEXTROSE-NACL 5-0.45 % IV SOLN
INTRAVENOUS | Status: DC
  Administered 2015-11-30: 16:00:00 via INTRAVENOUS

## 2015-11-30 MED ORDER — MELATONIN 3 MG PO CAPS: 2.0000 | ORAL_CAPSULE | Freq: Every day | ORAL | Status: DC

## 2015-11-30 MED ORDER — DEXTROSE 5 % IV SOLN: 2.0000 g | INTRAVENOUS | Status: DC

## 2015-11-30 MED ORDER — METOPROLOL TARTRATE 25 MG PO TABS
25.0000 mg | ORAL_TABLET | Freq: Two times a day (BID) | ORAL | Status: DC
  Administered 2015-11-30 – 2015-12-03 (×6): 25 mg via ORAL
  Filled 2015-11-30 (×6): qty 1

## 2015-11-30 MED ORDER — ROCURONIUM BROMIDE 100 MG/10ML IV SOLN
INTRAVENOUS | Status: DC | PRN
  Administered 2015-11-30: 20 mg via INTRAVENOUS

## 2015-11-30 MED ORDER — INSULIN ASPART 100 UNIT/ML ~~LOC~~ SOLN
0.0000 [IU] | Freq: Every day | SUBCUTANEOUS | Status: DC
  Administered 2015-11-30 – 2015-12-01 (×2): 2 [IU] via SUBCUTANEOUS
  Filled 2015-11-30: qty 2

## 2015-11-30 MED ORDER — ENOXAPARIN SODIUM 40 MG/0.4ML ~~LOC~~ SOLN
40.0000 mg | SUBCUTANEOUS | Status: DC
  Administered 2015-12-01 – 2015-12-03 (×3): 40 mg via SUBCUTANEOUS
  Filled 2015-11-30 (×3): qty 0.4

## 2015-11-30 MED ORDER — GLIMEPIRIDE 2 MG PO TABS: 8.0000 mg | ORAL_TABLET | Freq: Every day | ORAL | Status: DC

## 2015-11-30 MED ORDER — IPRATROPIUM-ALBUTEROL 0.5-2.5 (3) MG/3ML IN SOLN
3.0000 mL | Freq: Once | RESPIRATORY_TRACT | Status: DC | PRN
  Administered 2015-11-30: 3 mL via RESPIRATORY_TRACT

## 2015-11-30 MED ORDER — ACETAMINOPHEN 650 MG RE SUPP: 650.0000 mg | Freq: Four times a day (QID) | RECTAL | Status: DC | PRN

## 2015-11-30 MED ORDER — LIDOCAINE HCL (CARDIAC) 20 MG/ML IV SOLN
INTRAVENOUS | Status: DC | PRN
  Administered 2015-11-30: 100 mg via INTRAVENOUS

## 2015-11-30 MED ORDER — DEXAMETHASONE SODIUM PHOSPHATE 10 MG/ML IJ SOLN
INTRAMUSCULAR | Status: DC | PRN
  Administered 2015-11-30: 10 mg via INTRAVENOUS

## 2015-11-30 MED ORDER — DIPHENHYDRAMINE HCL 12.5 MG/5ML PO ELIX: 12.5000 mg | ORAL_SOLUTION | ORAL | Status: DC | PRN

## 2015-11-30 MED ORDER — FENTANYL CITRATE (PF) 100 MCG/2ML IJ SOLN
25.0000 ug | INTRAMUSCULAR | Status: DC | PRN
Start: 2015-11-30 — End: 2015-11-30

## 2015-11-30 MED ORDER — LISINOPRIL 20 MG PO TABS
20.0000 mg | ORAL_TABLET | Freq: Every day | ORAL | Status: DC
  Administered 2015-12-01 – 2015-12-03 (×3): 20 mg via ORAL
  Filled 2015-11-30 (×3): qty 1

## 2015-11-30 MED ORDER — ACETAMINOPHEN 325 MG PO TABS: 650.0000 mg | ORAL_TABLET | Freq: Four times a day (QID) | ORAL | Status: DC | PRN

## 2015-11-30 MED ORDER — VANCOMYCIN HCL IN DEXTROSE 1-5 GM/200ML-% IV SOLN
1000.0000 mg | Freq: Once | INTRAVENOUS | Status: AC
  Administered 2015-11-30: 1000 mg via INTRAVENOUS

## 2015-11-30 MED ORDER — PROMETHAZINE HCL 25 MG/ML IJ SOLN
12.5000 mg | Freq: Once | INTRAMUSCULAR | Status: AC
  Administered 2015-11-30: 12.5 mg via INTRAVENOUS
  Filled 2015-11-30: qty 1

## 2015-11-30 MED ORDER — VANCOMYCIN HCL 10 G IV SOLR
1500.0000 mg | Freq: Two times a day (BID) | INTRAVENOUS | Status: DC
  Administered 2015-11-30 – 2015-12-01 (×2): 1500 mg via INTRAVENOUS
  Filled 2015-11-30 (×3): qty 1500

## 2015-11-30 MED ORDER — ONDANSETRON HCL 4 MG/2ML IJ SOLN: 4.0000 mg | Freq: Once | INTRAMUSCULAR | Status: DC | PRN

## 2015-11-30 MED ORDER — MAGNESIUM HYDROXIDE 400 MG/5ML PO SUSP
30.0000 mL | Freq: Every day | ORAL | Status: DC | PRN
  Administered 2015-12-03: 30 mL via ORAL
  Filled 2015-11-30: qty 30

## 2015-11-30 MED ORDER — FENTANYL CITRATE (PF) 100 MCG/2ML IJ SOLN
INTRAMUSCULAR | Status: DC | PRN
  Administered 2015-11-30: 100 ug via INTRAVENOUS

## 2015-11-30 MED ORDER — METFORMIN HCL ER 500 MG PO TB24: 1000.0000 mg | ORAL_TABLET | Freq: Two times a day (BID) | ORAL | Status: DC

## 2015-11-30 MED ORDER — PROPOFOL 10 MG/ML IV BOLUS
INTRAVENOUS | Status: DC | PRN
  Administered 2015-11-30: 150 mg via INTRAVENOUS

## 2015-11-30 MED ORDER — IPRATROPIUM-ALBUTEROL 0.5-2.5 (3) MG/3ML IN SOLN
3.0000 mL | Freq: Four times a day (QID) | RESPIRATORY_TRACT | Status: DC
  Administered 2015-11-30 – 2015-12-01 (×3): 3 mL via RESPIRATORY_TRACT
  Filled 2015-11-30 (×3): qty 3

## 2015-11-30 SURGICAL SUPPLY — 25 items
BANDAGE ACE 4X5 VEL STRL LF (GAUZE/BANDAGES/DRESSINGS) ×3 IMPLANT
BANDAGE ELASTIC 4 LF NS (GAUZE/BANDAGES/DRESSINGS) ×3 IMPLANT
BLADE FULL RADIUS 3.5 (BLADE) IMPLANT
BLADE INCISOR PLUS 4.5 (BLADE) IMPLANT
BLADE SHAVER 4.5 DBL SERAT CV (CUTTER) IMPLANT
BLADE SHAVER 4.5X7 STR FR (MISCELLANEOUS) IMPLANT
CHLORAPREP W/TINT 26ML (MISCELLANEOUS) ×3 IMPLANT
CUTTER AGGRESSIVE+ 3.5 (CUTTER) ×3 IMPLANT
GAUZE SPONGE 4X4 12PLY STRL (GAUZE/BANDAGES/DRESSINGS) ×3 IMPLANT
GLOVE SURG ORTHO 9.0 STRL STRW (GLOVE) ×3 IMPLANT
GOWN STRL REUS W/ TWL LRG LVL3 (GOWN DISPOSABLE) ×1 IMPLANT
GOWN STRL REUS W/TWL LRG LVL3 (GOWN DISPOSABLE) ×2
GOWN SURG XXL (GOWNS) ×3 IMPLANT
IV LACTATED RINGER IRRG 3000ML (IV SOLUTION) ×8
IV LR IRRIG 3000ML ARTHROMATIC (IV SOLUTION) ×4 IMPLANT
KIT RM TURNOVER STRD PROC AR (KITS) ×3 IMPLANT
MANIFOLD NEPTUNE II (INSTRUMENTS) ×3 IMPLANT
PACK ARTHROSCOPY KNEE (MISCELLANEOUS) ×3 IMPLANT
SET TUBE SUCT SHAVER OUTFL 24K (TUBING) ×3 IMPLANT
SET TUBE TIP INTRA-ARTICULAR (MISCELLANEOUS) ×3 IMPLANT
SUT ETHILON 4-0 (SUTURE) ×2
SUT ETHILON 4-0 FS2 18XMFL BLK (SUTURE) ×1
SUTURE ETHLN 4-0 FS2 18XMF BLK (SUTURE) ×1 IMPLANT
TUBING ARTHRO INFLOW-ONLY STRL (TUBING) ×3 IMPLANT
WAND HAND CNTRL MULTIVAC 50 (MISCELLANEOUS) ×3 IMPLANT

## 2015-11-30 NOTE — Transfer of Care (Signed)
Immediate Anesthesia Transfer of Care Note  Patient: Patricia [name]  Procedure(s) Performed: Procedure(s): ARTHROSCOPY KNEE (Right)  Patient Location: PACU  Anesthesia Type:General  Level of Consciousness: sedated  Airway & Oxygen Therapy: Patient Spontanous Breathing and Patient connected to face mask oxygen  Post-op Assessment: Report given to RN and Post -op Vital signs reviewed and stable  Post vital signs: Reviewed and stable  Last Vitals:  Filed Vitals:   11/30/15 1708 11/30/15 1709  BP:    Pulse: 94 105  Temp:    Resp: 18 19    Complications: No apparent anesthesia complications

## 2015-11-30 NOTE — H&P (Signed)
Subjective:   Patient is a 50 y.o. female presents with right knee pain and swelling.  Had aspiration on 11/28/15 showing infection.. Onset of symptoms was gradual starting 4 days ago with gradually worsening course since that time. The pain is located all around knee. Patient describes the pain as aching  continuous and rated as moderate. Pain has been associated with swelling in knee. Patient denies injury,.  Did have knee injections in past 6 weeks. Symptoms are aggravated by bending. Symptoms improve with nothing. Past history includes knee DJD, prior MRSA infection and diabetes..  Previous studies include aspiration of fluid with postive culture.  Patient Active Problem List   Diagnosis Date Noted  . Hypertension 09/05/2015  . BPPV (benign paroxysmal positional vertigo) 09/05/2015  . Insomnia 09/05/2015  . 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 polyneuropathy, without long-term current use of insulin ([place]) 09/05/2015   Past Medical History  Diagnosis Date  . Diabetes mellitus without complication ([place])   . Hypertension   . Hyperlipidemia     History reviewed. No pertinent past surgical history.  Prescriptions prior to admission  Medication Sig Dispense Refill Last Dose  . aspirin EC 81 MG tablet Take 81 mg by mouth daily.   11/30/2015 at 0730  . blood glucose meter kit and supplies KIT Dispense based on patient and insurance preference. Use up to four times daily as directed. (FOR ICD-9 250.00, 250.01). 1 each 0 11/30/2015 at am  . FLUoxetine (PROZAC) 40 MG capsule Take 1 capsule (40 mg total) by mouth daily. 30 capsule 2 11/30/2015 at 0730  . glimepiride (AMARYL) 4 MG tablet Take 2 tablets (8 mg total) by mouth daily with breakfast. 60 tablet 2 11/30/2015 at 0730  . Insulin Glargine (TOUJEO SOLOSTAR) 300 UNIT/ML SOPN Inject 10 Units into the skin at bedtime. 5 pen 11 11/29/2015 at 2200  . metformin (FORTAMET) 1000 MG (OSM) 24 hr tablet Take 1 tablet (1,000 mg total) by mouth 2 (two)  times daily. 60 tablet 2 11/30/2015 at 0730  . metoprolol tartrate (LOPRESSOR) 25 MG tablet Take 1 tablet (25 mg total) by mouth 2 (two) times daily. 60 tablet 2 11/30/2015 at 0730  . furosemide (LASIX) 20 MG tablet Take 0.5 tablets (10 mg total) by mouth daily. For 1 week. (Patient not taking: Reported on 11/30/2015) 5 tablet 0 Completed Course at "she only gave me 5 in february"  . Insulin Pen Needle 29G X 12.7MM MISC 1 each by Does not apply route daily. 90 each 3 Taking  . lisinopril (PRINIVIL,ZESTRIL) 20 MG tablet Take 1 tablet (20 mg total) by mouth daily. (Patient not taking: Reported on 11/30/2015) 30 tablet 2 Not Taking at 0730  . meclizine (ANTIVERT) 32 MG tablet Take 1 tablet (32 mg total) by mouth 3 (three) times daily as needed. (Patient not taking: Reported on 11/30/2015) 30 tablet 0 Not Taking at "don';t take no more"  . Melatonin 3 MG CAPS Take 2 capsules (6 mg total) by mouth daily.  0 "not in over a year"  . ONE TOUCH ULTRA TEST test strip CHECK BLOOD GLUCOSE UP TO FOUR TIMES DAILY AS DIRECTED 100 each 11   . ONETOUCH DELICA LANCETS 37T MISC    Taking   No Known Allergies  Social History  Substance Use Topics  . Smoking status: Never Smoker   . Smokeless tobacco: Not on file  . Alcohol Use: Not on file    Family History  Problem Relation Age of Onset  . Diabetes  Mother   . Diabetes Father   . Hyperlipidemia Father     Review of Systems Pertinent items are noted in HPI.  Objective:   Patient Vitals for the past 8 hrs:  BP Temp Temp src Pulse Resp SpO2 Height Weight  11/30/15 1500 (!) 117/103 mmHg 98.3 F (36.8 C) Oral (!) 102 16 95 % '5\' 6"'$  (1.676 m) 90.719 kg (200 lb)          BP 117/103 mmHg  Pulse 102  Temp(Src) 98.3 F (36.8 C) (Oral)  Resp 16  Ht '5\' 6"'$  (1.676 m)  Wt 90.719 kg (200 lb)  BMI 32.30 kg/m2  SpO2 95% General appearance: alert, cooperative and appears stated age Neck: no adenopathy and supple, symmetrical, trachea midline Lungs: clear to  auscultation bilaterally Heart: regular rate and rhythm, S1, S2 normal, no murmur, click, rub or gallop Extremities: large effusion left knee, ROM 10-60 degrees, skin intact  ECG: no prior ECG.  Data Review  32 K WBC's with 95 % PMN. Gm pos cocci growing  Assessment:   Active Problems:   * No active hospital problems. * Septic knee, right  Plan:   Arthroscopic irrigation right knee, admit for Iv antibiotics and ID consult

## 2015-11-30 NOTE — Consult Note (Signed)
[facility] NAME: Patricia [name]    MR#:  [id number]  DATE OF BIRTH:  1965/09/15  DATE OF ADMISSION:  11/30/2015  PRIMARY CARE PHYSICIAN: Dr. Luan Pulling  REQUESTING/REFERRING PHYSICIAN: Dr. Hessie Knows  CHIEF COMPLAINT:  Right knee pain  HISTORY OF PRESENT ILLNESS:  Patricia [name]  is a 50 y.o. female has been battling right knee pain since February. She does have a couple of steroid injections one in February and one in March. As per family a silicone injection also. The pain in her right knee has been so bad that she can hardly walk on it and she was put on crutches. Pain described as 10 in intensity. On Wednesday she saw Dr. Gwenlyn Fudge rheumatology and albuterol fluid and sent it off to the lab for culture. Today it came back as gram-positive cocci in chains and wife was also synovial fluid 32,266 with 95% of them being neutrophils. The patient was brought to the operating room today for irrigation and drainage of the right knee joint. Patient today had nausea and vomiting and then some cough and shortness of breath and was found to have an aspiration pneumonia on chest x-ray. Hospitalist services were contacted and consultation for medical management and diabetes management.  PAST MEDICAL HISTORY:   Past Medical History  Diagnosis Date  . Diabetes mellitus without complication ([place])   . Hypertension   . Hyperlipidemia   . Anemia   . Depression     PAST SURGICAL HISTOIRY:   Past Surgical History  Procedure Laterality Date  . Mrsa abdominal wall    . Cholecystectomy    . Right knee arthroscopic      SOCIAL HISTORY:   Social History  Substance Use Topics  . Smoking status: Never Smoker   . Smokeless tobacco: Not on file  . Alcohol Use: No    FAMILY HISTORY:   Family History  Problem Relation Age of Onset  . Diabetes Mother   . CAD Mother   . Diabetes Father   . Hyperlipidemia Father     DRUG ALLERGIES:  No Known  Allergies  REVIEW OF SYSTEMS:  CONSTITUTIONAL: Positive for fever on Monday and Tuesday, no fatigue or weakness.  EYES: No blurred or double vision. Wears glasses EARS, NOSE, AND THROAT: No tinnitus or ear pain. Positive for runny nose and sore throat. RESPIRATORY: Positive for cough today, some shortness of breath today, no wheezing or hemoptysis.  CARDIOVASCULAR: No chest pain, orthopnea, edema.  GASTROINTESTINAL: Positive for nausea and vomiting today, no diarrhea or abdominal pain.  GENITOURINARY: No dysuria, hematuria.  ENDOCRINE: No polyuria, nocturia,  HEMATOLOGY: History of anemia, no easy bruising or bleeding SKIN: No rash or lesion. MUSCULOSKELETAL: Right knee pain  NEUROLOGIC: No tingling, numbness, weakness.  PSYCHIATRY: No anxiety or depression.   MEDICATIONS AT HOME:   Prior to Admission medications   Medication Sig Start Date End Date Taking? Authorizing Provider  aspirin EC 81 MG tablet Take 81 mg by mouth daily.   Yes Historical Provider, MD  blood glucose meter kit and supplies KIT Dispense based on patient and insurance preference. Use up to four times daily as directed. (FOR ICD-9 250.00, 250.01). 09/05/15  Yes Amy Overton Mam, NP  FLUoxetine (PROZAC) 40 MG capsule Take 1 capsule (40 mg total) by mouth daily. 08/31/15  Yes Amy Overton Mam, NP  glimepiride (AMARYL) 4 MG tablet Take 2 tablets (8 mg total) by mouth daily with breakfast. 08/31/15  Yes  Amy Overton Mam, NP  Insulin Glargine (TOUJEO SOLOSTAR) 300 UNIT/ML SOPN Inject 10 Units into the skin at bedtime. 09/07/15  Yes Amy Overton Mam, NP  metformin (FORTAMET) 1000 MG (OSM) 24 hr tablet Take 1 tablet (1,000 mg total) by mouth 2 (two) times daily. 08/31/15  Yes Amy Overton Mam, NP  metoprolol tartrate (LOPRESSOR) 25 MG tablet Take 1 tablet (25 mg total) by mouth 2 (two) times daily. 08/31/15  Yes Amy Overton Mam, NP  furosemide (LASIX) 20 MG tablet Take 0.5 tablets (10 mg total) by mouth daily. For 1 week. Patient not  taking: Reported on 11/30/2015 09/11/15   Amy Overton Mam, NP  Insulin Pen Needle 29G X 12.7MM MISC 1 each by Does not apply route daily. 09/05/15   Amy Overton Mam, NP  lisinopril (PRINIVIL,ZESTRIL) 20 MG tablet Take 1 tablet (20 mg total) by mouth daily. Patient not taking: Reported on 11/30/2015 08/31/15   Amy Overton Mam, NP  meclizine (ANTIVERT) 32 MG tablet Take 1 tablet (32 mg total) by mouth 3 (three) times daily as needed. Patient not taking: Reported on 11/30/2015 09/05/15   Amy Overton Mam, NP  Melatonin 3 MG CAPS Take 2 capsules (6 mg total) by mouth daily. 09/05/15   Amy Overton Mam, NP  ONE TOUCH ULTRA TEST test strip CHECK BLOOD GLUCOSE UP TO FOUR TIMES DAILY AS DIRECTED 10/08/15   Amy Overton Mam, NP  Glory Rosebush DELICA LANCETS 26Z MISC  09/07/15   Historical Provider, MD      VITAL SIGNS:  Blood pressure 130/75, pulse 96, temperature 98.4 F (36.9 C), temperature source Oral, resp. rate 18, height '5\' 6"'$  (1.676 m), weight 90.719 kg (200 lb), SpO2 93 %.  PHYSICAL EXAMINATION:  GENERAL:  50 y.o.-year-old patient lying in the bed with no acute distress.  EYES: Pupils equal, round, reactive to light and accommodation. No scleral icterus. Extraocular muscles intact.  HEENT: Head atraumatic, normocephalic. Oropharynx and nasopharynx clear.  NECK:  Supple, no jugular venous distention. No thyroid enlargement, no tenderness.  LUNGS: Normal breath sounds bilaterally, no wheezing, rales,rhonchi or crepitation. No use of accessory muscles of respiration.  CARDIOVASCULAR: S1, S2 normal. No murmurs, rubs, or gallops.  ABDOMEN: Soft, nontender, nondistended. Bowel sounds present. No organomegaly or mass.  EXTREMITIES: Trace edema, no cyanosis, or clubbing. Right knee covered in bandage with a drain coming out. NEUROLOGIC: Cranial nerves II through XII are intact. Sensation intact. Gait not checked. Able to dorsiflex and extend at the ankle bilaterally PSYCHIATRIC: The patient is alert and oriented x  3.  SKIN: No obvious rash, lesion, or ulcer.   LABORATORY PANEL:   CBC  Recent Labs Lab 11/30/15 1511  WBC 9.6  HGB 11.5*  HCT 34.2*  PLT 426   ------------------------------------------------------------------------------------------------------------------  Chemistries   Recent Labs Lab 11/30/15 1511  NA 137  K 4.4  CL 105  CO2 24  GLUCOSE 93  BUN 26*  CREATININE 0.80  CALCIUM 9.7   ------------------------------------------------------------------------------------------------------------------   RADIOLOGY:  Dg Chest 1 View  11/30/2015  CLINICAL DATA:  Postop from the surgery.  Questionable aspiration. EXAM: CHEST 1 VIEW COMPARISON:  03/08/2007 FINDINGS: There is new airspace opacity that projects in the left upper lobe extending from the hilum. This is consistent with aspiration pneumonitis. Remainder of the lungs is clear. Heart, mediastinum and hila are unremarkable. No pleural effusion or pneumothorax. IMPRESSION: Left upper lobe airspace opacity consistent with aspiration pneumonitis. Electronically Signed   By: Lajean Manes M.D.   On: 11/30/2015  17:38    EKG:   Not done  IMPRESSION AND PLAN:   1. Aspiration pneumonia. I will have pharmacy dose Zosyn. Add nebulizer treatment. 2. Right knee septic joint. Vancomycin and pharmacy will dose. Likely will need longer-term antibiotic that will cover the knee joint. So far gram-positive cocci in chains growing out of the culture. Infectious disease consultation. Case discussed with Dr Ola Spurr. Hopefully can de-escalate abx tomorrow 3. Type 2 diabetes mellitus continue. Low-dose Lantus and sliding scale. Hold Amaryl and Glucophage until I know she can tolerate a diet. 4. Essential hypertension. Continue lisinopril 5. Depression. On Prozac  All the records are reviewed and case discussed with Consulting provider. Management plans discussed with the patient, family and they are in agreement.  CODE STATUS: Full  code  TOTAL TIME TAKING CARE OF THIS PATIENT: 50 minutes.    Loletha Grayer M.D on 11/30/2015 at 7:14 PM  Between 7am to 6pm - Pager - [phone]  After 6pm go to [email or web address] - password [email or web address]  [facility]  Office  [phone]  CC: Primary care Physician: Dr. Luan Pulling

## 2015-11-30 NOTE — Anesthesia Postprocedure Evaluation (Signed)
Anesthesia Post Note  Patient: Patricia [name]  Procedure(s) Performed: Procedure(s) (LRB): ARTHROSCOPY KNEE (Right)  Patient location during evaluation: PACU Anesthesia Type: General Level of consciousness: awake and alert Pain management: pain level controlled Vital Signs Assessment: post-procedure vital signs reviewed and stable Respiratory status: spontaneous breathing, nonlabored ventilation, respiratory function stable and patient connected to nasal cannula oxygen Cardiovascular status: blood pressure returned to baseline and stable Postop Assessment: no signs of nausea or vomiting Anesthetic complications: yes Comments: CXR taken in PACU shows aspiration pneumonitis. Patient intubated utilizing RSI for this procedure due to history of uncontrolled GERD.  No gastric contents visualized with direct laryngoscopy during intubation and none suctioned from ETT after intubation.  This would be an extremely early radiographic presentation for an aspiration during our induction.  Patients saturation was 91% on room air when she entered the OR.  Concern that patient may have aspirated at a time early than induction. This was explained to patient and patient voiced understanding. Patient to be kept overnight for further monitoring.    Last Vitals:  Filed Vitals:   11/30/15 1825 11/30/15 1851  BP: 151/89 130/75  Pulse: 99 96  Temp: 37.1 C 36.9 C  Resp: 18 18    Last Pain:  Filed Vitals:   11/30/15 1851  PainSc: 0-No pain                 Cleda MccreedyJoseph K Ashlyne Olenick

## 2015-11-30 NOTE — Progress Notes (Signed)
PHARMACIST - PHYSICIAN ORDER COMMUNICATION  CONCERNING: P&T Medication Policy on Herbal Medications  DESCRIPTION:  This patient's order for:  Melatonin  has been noted.  This product(s) is classified as an "herbal" or natural product. Due to a lack of definitive safety studies or FDA approval, nonstandard manufacturing practices, plus the potential risk of unknown drug-drug interactions while on inpatient medications, the Pharmacy and Therapeutics Committee does not permit the use of "herbal" or natural products of this type within [facility].   ACTION TAKEN: The pharmacy department is unable to verify this order at this time and your patient has been informed of this safety policy. Please reevaluate patient's clinical condition at discharge and address if the herbal or natural product(s) should be resumed at that time.  Clovia CuffLisa Ellanore Vanhook, PharmD, BCPS 11/30/2015 6:42 PM

## 2015-11-30 NOTE — Progress Notes (Addendum)
ANTIBIOTIC CONSULT NOTE - INITIAL  Pharmacy Consult for Vancomycin, Zosyn Indication: septic arthritis, aspiration PNA   No Known Allergies  Patient Measurements: Height: 5\' 6"  (167.6 cm) Weight: 200 lb (90.719 kg) IBW/kg (Calculated) : 59.3 Adjusted Body Weight: 71.9 kg   Vital Signs: Temp: 98.4 F (36.9 C) (04/07 1851) Temp Source: Oral (04/07 1851) BP: 130/75 mmHg (04/07 1851) Pulse Rate: 96 (04/07 1851) Intake/Output from previous day:   Intake/Output from this shift: Total I/O In: 700 [I.V.:700] Out: 0   Labs:  Recent Labs  11/30/15 1511  WBC 9.6  HGB 11.5*  PLT 426  CREATININE 0.80   Estimated Creatinine Clearance: 94.4 mL/min (by C-G formula based on Cr of 0.8). No results for input(s): VANCOTROUGH, VANCOPEAK, VANCORANDOM, GENTTROUGH, GENTPEAK, GENTRANDOM, TOBRATROUGH, TOBRAPEAK, TOBRARND, AMIKACINPEAK, AMIKACINTROU, AMIKACIN in the last 72 hours.   Microbiology: Recent Results (from the past 720 hour(s))  Culture, body fluid-bottle     Status: None (Preliminary result)   Collection Time: 11/28/15 11:27 AM  Result Value Ref Range Status   Specimen Description SYNOVIAL  Final   Special Requests NONE  Final   Gram Stain GRAM POSITIVE COCCI IN CHAINS MODERATE WBC SEEN   Final   Culture   Final    GRAM POSITIVE COCCI IDENTIFICATION AND SUSCEPTIBILITIES TO FOLLOW    Report Status PENDING  Incomplete  Surgical pcr screen     Status: None   Collection Time: 11/30/15  3:12 PM  Result Value Ref Range Status   MRSA, PCR NEGATIVE NEGATIVE Final   Staphylococcus aureus NEGATIVE NEGATIVE Final    Comment:        The Xpert SA Assay (FDA approved for NASAL specimens in patients over 52 years of age), is one component of a comprehensive surveillance program.  Test performance has been validated by [facility] for patients greater than or equal to 554 year old. It is not intended to diagnose infection nor to guide or monitor treatment.     Medical  History: Past Medical History  Diagnosis Date  . Diabetes mellitus without complication ([facility])   . Hypertension   . Hyperlipidemia     Medications:  Scheduled:  . [START ON 12/01/2015] aspirin EC  81 mg Oral Daily  . docusate sodium  100 mg Oral BID  . [START ON 12/01/2015] enoxaparin (LOVENOX) injection  40 mg Subcutaneous Q24H  . [START ON 12/01/2015] FLUoxetine  40 mg Oral Daily  . [START ON 12/01/2015] glimepiride  8 mg Oral Q breakfast  . [START ON 12/01/2015] insulin aspart  0-20 Units Subcutaneous TID WC  . insulin glargine  10 Units Subcutaneous QHS  . lisinopril  20 mg Oral Daily  . metformin  1,000 mg Oral BID  . metoprolol tartrate  25 mg Oral BID  . sodium chloride      . sodium chloride      . vancomycin  500 mg Intravenous Once   Assessment: CrCl = 94.4 ml/min Ke = 0.083 hr-1 T1/2 = 8.35 hrs Vd = 63.5 L   Goal of Therapy:  Vancomycin trough level 15-20 mcg/ml  Plan:  Expected duration 7 days with resolution of temperature and/or normalization of WBC   Vancomycin 1 gm IV X 1 given on 4/7 @ 15:46. Vancomycin 500 mg IV X 1 ordered to be given following initial 1 gm dose to make total loading dose of 1500 mg. Vancomycin 1500 mg IV Q12H ordered to start 4/8 @ 23:00 (stacked dosing).  This pt will reach Css  by 4/9 @ 10:00. Will draw 1st trough on 4/9 @ 10:30, which will be at Css.   Will start Zosyn 3.375 gm IV Q8H EI to start 4/7 @ 21:00.   Patricia [name] 11/30/2015,7:00 PM

## 2015-11-30 NOTE — Progress Notes (Signed)
ID E note Patients chart reviewed. Synoval fluid with 32K WBC and predominant PMN S/p wash out 4/7-   Given septic arthritis will need 2 week course of IV abx Will give vanco and ctx for now Further recs based on culture PICC has been ordered

## 2015-11-30 NOTE — Op Note (Signed)
11/30/2015  5:05 PM  PATIENT:  Patricia AskewPhyllis J [name]  50 y.o. female  PRE-OPERATIVE DIAGNOSIS:  septic right knee  POST-OPERATIVE DIAGNOSIS:  septic right knee  PROCEDURE:  Procedure(s): ARTHROSCOPY KNEE (Right) arthroscopic irrigation and debridement septic knee  SURGEON: Leitha SchullerMichael J Deward Sebek, MD  ASSISTANTS: None  ANESTHESIA:   general  EBL:     BLOOD ADMINISTERED:none  DRAINS: (Medium) Hemovact drain(s) in the Joint with  Suction Open   LOCAL MEDICATIONS USED:  NONE  SPECIMEN:  No Specimen  DISPOSITION OF SPECIMEN:  N/A  COUNTS:  YES  TOURNIQUET:    IMPLANTS: None  DICTATION: .Dragon Dictation patient was brought the operating room and after adequate anesthesia was obtained the right leg was prepped in sterile fashion with the arthroscopic were applied. After prepping draping appropriate patient identification and timeout procedure completed. Inferolateral portal was made and the scope was introduced with gross pus present. A superolateral portal was then made and the arthroscope was introduced with outflow through the superolateral portal and inspection revealed advanced patellofemoral medial compartment arthritis with exposed bone in both the moderate arthritis in the lateral compartment there was some synovitis present and so an inferior medial portal was made and a arthroscopic shaver was placed debriding the synovitis evidence of a synovitis of been adequately resected knee was irrigated thoroughly irrigated with a total of 9 L of fluid going to the knee at that point drains were placed through the superior lateral and inferior lateral portals and hooked to Hemovac the wounds were dressed with Xeroform 4 x 4 web roll and Ace wrap patient center comes stable condition couldn't specimen and oriented been taken with cultures so additional cultures were taken vancomycin being started prior to the start of the case  PLAN OF CARE: Admit to inpatient   PATIENT DISPOSITION:  PACU -  hemodynamically stable.

## 2015-11-30 NOTE — Anesthesia Preprocedure Evaluation (Addendum)
Anesthesia Evaluation  Patient identified by MRN, date of birth, ID band Patient awake    Reviewed: Allergy & Precautions, NPO status , Patient's Chart, lab work & pertinent test results  History of Anesthesia Complications Negative for: history of anesthetic complications  Airway Mallampati: III       Dental   Pulmonary neg pulmonary ROS,           Cardiovascular hypertension, Pt. on medications and Pt. on home beta blockers      Neuro/Psych  Neuromuscular disease (diabetic peripheral neuropathy)    GI/Hepatic negative GI ROS, Neg liver ROS,   Endo/Other  diabetes, Type 2, Oral Hypoglycemic Agents  Renal/GU negative Renal ROS     Musculoskeletal   Abdominal   Peds  Hematology   Anesthesia Other Findings   Reproductive/Obstetrics                            Anesthesia Physical Anesthesia Plan  ASA: III  Anesthesia Plan: General   Post-op Pain Management:    Induction: Intravenous  Airway Management Planned: Nasal Cannula  Additional Equipment:   Intra-op Plan:   Post-operative Plan:   Informed Consent: I have reviewed the patients History and Physical, chart, labs and discussed the procedure including the risks, benefits and alternatives for the proposed anesthesia with the patient or authorized representative who has indicated his/her understanding and acceptance.     Plan Discussed with:   Anesthesia Plan Comments:         Anesthesia Quick Evaluation

## 2015-11-30 NOTE — Anesthesia Procedure Notes (Signed)
Procedure Name: Intubation Date/Time: 11/30/2015 4:01 PM Performed by: Stormy FabianURTIS, Sony Schlarb Pre-anesthesia Checklist: Patient identified, Patient being monitored, Timeout performed, Emergency Drugs available and Suction available Patient Re-evaluated:Patient Re-evaluated prior to inductionOxygen Delivery Method: Circle system utilized Preoxygenation: Pre-oxygenation with 100% oxygen Intubation Type: IV induction Ventilation: Mask ventilation without difficulty Laryngoscope Size: Mac and 3 Grade View: Grade I Tube type: Oral Tube size: 7.0 mm Number of attempts: 1 Airway Equipment and Method: Stylet Placement Confirmation: ETT inserted through vocal cords under direct vision,  positive ETCO2 and breath sounds checked- equal and bilateral Secured at: 21 cm Tube secured with: Tape Dental Injury: Teeth and Oropharynx as per pre-operative assessment

## 2015-11-30 NOTE — Progress Notes (Signed)
Dr Rosita KeaMenz in to see 335 pm advises ok to start vancomycin now and w/o PCR results back (cultures done and MRSA hx)

## 2015-12-01 ENCOUNTER — Inpatient Hospital Stay: Payer: BC Managed Care – PPO

## 2015-12-01 ENCOUNTER — Inpatient Hospital Stay (HOSPITAL_COMMUNITY)
Admission: AD | Admit: 2015-12-01 | Discharge: 2015-12-01 | Disposition: A | Discharge status: Home / Self Care | Payer: BC Managed Care – PPO | Source: Ambulatory Visit | Attending: Infectious Diseases | Admitting: Infectious Diseases

## 2015-12-01 DIAGNOSIS — M00869 Arthritis due to other bacteria, unspecified knee: Secondary | ICD-10-CM

## 2015-12-01 LAB — POTASSIUM: POTASSIUM: 4.4 mmol/L (ref 3.5–5.1)

## 2015-12-01 LAB — CULTURE, BODY FLUID W GRAM STAIN -BOTTLE

## 2015-12-01 LAB — ECHOCARDIOGRAM COMPLETE
HEIGHTINCHES: 66 in
Weight: 3200 oz

## 2015-12-01 LAB — GLUCOSE, CAPILLARY
Glucose-Capillary: 176 mg/dL — ABNORMAL HIGH (ref 65–99)
Glucose-Capillary: 165 mg/dL — ABNORMAL HIGH (ref 65–99)
Glucose-Capillary: 146 mg/dL — ABNORMAL HIGH (ref 65–99)
Glucose-Capillary: 219 mg/dL — ABNORMAL HIGH (ref 65–99)

## 2015-12-01 LAB — C-REACTIVE PROTEIN: CRP: 6.6 mg/dL — AB (ref <1.0)

## 2015-12-01 LAB — BASIC METABOLIC PANEL
Anion gap: 6 (ref 5–15)
BUN: 21 mg/dL — AB (ref 6–20)
CHLORIDE: 103 mmol/L (ref 101–111)
CO2: 26 mmol/L (ref 22–32)
CREATININE: 0.74 mg/dL (ref 0.44–1.00)
Calcium: 8.9 mg/dL (ref 8.9–10.3)
Glucose, Bld: 265 mg/dL — ABNORMAL HIGH (ref 65–99)
Potassium: 5.2 mmol/L — ABNORMAL HIGH (ref 3.5–5.1)
SODIUM: 135 mmol/L (ref 135–145)

## 2015-12-01 LAB — CULTURE, BODY FLUID-BOTTLE

## 2015-12-01 MED ORDER — IPRATROPIUM-ALBUTEROL 0.5-2.5 (3) MG/3ML IN SOLN: 3.0000 mL | Freq: Four times a day (QID) | RESPIRATORY_TRACT | Status: DC | PRN

## 2015-12-01 MED ORDER — SODIUM CHLORIDE 0.9 % IV SOLN
3.0000 g | Freq: Four times a day (QID) | INTRAVENOUS | Status: DC
  Filled 2015-12-01 (×2): qty 3

## 2015-12-01 MED ORDER — SODIUM CHLORIDE 0.9 % IV SOLN
3.0000 g | Freq: Four times a day (QID) | INTRAVENOUS | Status: DC
  Administered 2015-12-01 – 2015-12-03 (×8): 3 g via INTRAVENOUS
  Filled 2015-12-01 (×10): qty 3

## 2015-12-01 MED ORDER — SODIUM POLYSTYRENE SULFONATE 15 GM/60ML PO SUSP
15.0000 g | Freq: Once | ORAL | Status: AC
  Administered 2015-12-01: 15 g via ORAL
  Filled 2015-12-01: qty 60

## 2015-12-01 NOTE — Progress Notes (Signed)
*  PRELIMINARY RESULTS* Echocardiogram 2D Echocardiogram has been performed.  Patricia [name] 12/01/2015, 3:50 PM

## 2015-12-01 NOTE — Progress Notes (Signed)
   Subjective: 1 Day Post-Op Procedure(s) (LRB): ARTHROSCOPY KNEE (Right) Patient reports pain as moderate.   Patient is well, but has had some minor complaints of cough this am. Woke up with congestion, slight blood tinged. Cough improved. No CP/SOB. O2 improved with 4 liters nasal cannula Denies any CP, SOB, ABD pain. We will continue therapy today.  Plan is to go Home after hospital stay.  Objective: Vital signs in last 24 hours: Temp:  [98.2 F (36.8 C)-99 F (37.2 C)] 98.8 F (37.1 C) (04/08 0744) Pulse Rate:  [88-112] 88 (04/08 0744) Resp:  [13-21] 17 (04/08 0744) BP: (105-158)/(69-103) 105/70 mmHg (04/08 0744) SpO2:  [83 %-99 %] 95 % (04/08 0811) Weight:  [90.719 kg (200 lb)] 90.719 kg (200 lb) (04/07 1500)  Intake/Output from previous day: 04/07 0701 - 04/08 0700 In: 2480 [P.O.:480; I.V.:1450; IV Piggyback:550] Out: 2620 [Urine:2550; Drains:70] Intake/Output this shift:     Recent Labs  11/30/15 1511  HGB 11.5*    Recent Labs  11/30/15 1511  WBC 9.6  RBC 4.00  HCT 34.2*  PLT 426    Recent Labs  11/30/15 1511 12/01/15 0338  NA 137 135  K 4.4 5.2*  CL 105 103  CO2 24 26  BUN 26* 21*  CREATININE 0.80 0.74  GLUCOSE 93 265*  CALCIUM 9.7 8.9   No results for input(s): LABPT, INR in the last 72 hours.  EXAM General - Patient is Alert, Appropriate and Oriented  Lungs- Clear to auscultation bilaterally. No wheezing rales or rhonchi. No accessory muscle use, speaks full sentences. Extremity - Neurologically intact Neurovascular intact Sensation intact distally Intact pulses distally Dorsiflexion/Plantar flexion intact Dressing - dressing C/D/I, no drainage and hemovac intact Motor Function - intact, moving foot and toes well on exam.   Past Medical History  Diagnosis Date  . Diabetes mellitus without complication ([facility])   . Hypertension   . Hyperlipidemia   . Anemia   . Depression     Assessment/Plan:   1 Day Post-Op Procedure(s)  (LRB): ARTHROSCOPY KNEE (Right) Active Problems:   Septic arthritis of knee, right (HCC)  Estimated body mass index is 32.3 kg/(m^2) as calculated from the following:   Height as of this encounter: 5\' 6"  (1.676 m).   Weight as of this encounter: 90.719 kg (200 lb). Advance diet Up with therapy  Recheck labs in the am ID and medicine following PICC line to be placed today Continue with IV abx Remove hemovac tomorrow Chest xray today, encouraged incentive spirometer     DVT Prophylaxis - Lovenox, Foot Pumps and TED hose Weight-Bearing as tolerated to right leg D/C O2 and Pulse OX and try on Room Air  T. Cranston Neighborhris Gaines, PA-C [facility] 12/01/2015, 8:35 AM

## 2015-12-01 NOTE — Progress Notes (Signed)
Care Line called for Picc eval

## 2015-12-01 NOTE — Progress Notes (Signed)
[facility] [place] at [facility]   PATIENT NAME: [facility]llis Horrell    MR#:  [id number]  DATE OF BIRTH:  03/29/1966  SUBJECTIVE:  CHIEF COMPLAINT:  Patient is feeling better. Cough and shortness of breath is better. Right knee pain is manageable with current medications, awaiting for PICC line placement today. Reporting coughing up blood-tinged sputum  REVIEW OF SYSTEMS:  CONSTITUTIONAL: No fever, fatigue or weakness.  EYES: No blurred or double vision.  EARS, NOSE, AND THROAT: No tinnitus or ear pain.  RESPIRATORY: Productive cough, improving shortness of breath, wheezing , reporting hemoptysis.  CARDIOVASCULAR: No chest pain, orthopnea, edema.  GASTROINTESTINAL: No nausea, vomiting, diarrhea or abdominal pain.  GENITOURINARY: No dysuria, hematuria.  ENDOCRINE: No polyuria, nocturia,  HEMATOLOGY: No anemia, easy bruising or bleeding SKIN: No rash or lesion. MUSCULOSKELETAL: Right knee pain and swelling  NEUROLOGIC: No tingling, numbness, weakness.  PSYCHIATRY: No anxiety or depression.   DRUG ALLERGIES:  No Known Allergies  VITALS:  Blood pressure 105/70, pulse 88, temperature 98.8 F (37.1 C), temperature source Oral, resp. rate 17, height 5\' 6"  (1.676 m), weight 90.719 kg (200 lb), SpO2 96 %.  PHYSICAL EXAMINATION:  GENERAL:  50 y.o.-year-old patient lying in the bed with no acute distress.  EYES: Pupils equal, round, reactive to light and accommodation. No scleral icterus. Extraocular muscles intact.  HEENT: Head atraumatic, normocephalic. Oropharynx and nasopharynx clear.  NECK:  Supple, no jugular venous distention. No thyroid enlargement, no tenderness.  LUNGs: Moderate breath sounds bilaterally, no wheezing, rales,rhonchi or crepitation. No use of accessory muscles of respiration.  CARDIOVASCULAR: S1, S2 normal. No murmurs, rubs, or gallops.  ABDOMEN: Soft, nontender, nondistended. Bowel sounds present. No organomegaly or mass.  EXTREMITIES:   right knee status post  Wash. No pedal edema, cyanosis, or clubbing.  NEUROLOGIC: Cranial nerves II through XII are intact. Muscle strength 5/5 in all extremities. Sensation intact. Gait not checked.  PSYCHIATRIC: The patient is alert and oriented x 3.  SKIN: No obvious rash, lesion, or ulcer.    LABORATORY PANEL:   CBC  Recent Labs Lab 11/30/15 1511  WBC 9.6  HGB 11.5*  HCT 34.2*  PLT 426   ------------------------------------------------------------------------------------------------------------------  Chemistries   Recent Labs Lab 12/01/15 0338  NA 135  K 5.2*  CL 103  CO2 26  GLUCOSE 265*  BUN 21*  CREATININE 0.74  CALCIUM 8.9   ------------------------------------------------------------------------------------------------------------------  Cardiac Enzymes No results for input(s): TROPONINI in the last 168 hours. ------------------------------------------------------------------------------------------------------------------  RADIOLOGY:  Dg Chest 1 View  12/01/2015  CLINICAL DATA:  50 year old female, status post PICC EXAM: CHEST 1 VIEW COMPARISON:  12/01/2015 FINDINGS: Interval placement of right upper extremity PICC, terminating at the superior cavoatrial junction. Similar appearance of improving left upper lobe airspace disease. Unchanged appearance of cardiomediastinal silhouette. IMPRESSION: Interval placement right upper extremity PICC, appearing to terminate superior cavoatrial junction. Similar appearance of left upper airspace disease. Signed, Yvone NeuJaime S. Loreta AveWagner, DO [facility] Electronically Signed   By: Gilmer MorJaime  Wagner D.O.   On: 12/01/2015 11:43   Dg Chest 1 View  11/30/2015  CLINICAL DATA:  Postop from the surgery.  Questionable aspiration. EXAM: CHEST 1 VIEW COMPARISON:  03/08/2007 FINDINGS: There is new airspace opacity that projects in the left upper lobe extending from the hilum. This is  consistent with aspiration pneumonitis. Remainder of the lungs is clear. Heart, mediastinum and hila are unremarkable. No pleural effusion or pneumothorax. IMPRESSION: Left upper lobe airspace opacity  consistent with aspiration pneumonitis. Electronically Signed   By: Amie Portland M.D.   On: 11/30/2015 17:38   Dg Chest 2 View  12/01/2015  CLINICAL DATA:  One day post right knee arthroscopic surgery. Follow-up presumed aspiration pneumonia in the left upper lobe. Productive cough. EXAM: CHEST  2 VIEW COMPARISON:  11/30/2015 and earlier. FINDINGS: Patchy airspace opacities in the left upper lobe, with improved aeration since yesterday. Stable mild chronic elevation of the left hemidiaphragm with chronic scar/atelectasis in the left lower lobe. No new pulmonary parenchymal abnormalities. Cardiomediastinal silhouette unremarkable, unchanged. Degenerative changes involving the thoracic spine. IMPRESSION: Left upper lobe pneumonia, with improved aeration since yesterday. No new abnormalities. Electronically Signed   By: Hulan Saas M.D.   On: 12/01/2015 11:11    EKG:   Orders placed or performed in visit on 05/12/11  . EKG 12-Lead    ASSESSMENT AND PLAN:    1. Aspiration pneumonia. Continue vancomycin and Zosyn. Repeat chest x-ray with stable pneumonia which is not getting worse. Continue nebulizer treatment. Check sputum culture and sensitivity 2. Right knee septic joint. ID is recommending Vancomycin and pharmacy will dose. Likely will need longer-term antibiotic that will cover the knee joint. So far gram-positive cocci in chains growing out of the culture. Sensitivity pending PICC line placed in the right upper extremity  Will follow up with Infectious disease consult  Dr Sampson Goon. 3. Type 2 diabetes mellitus continue. Low-dose Lantus and sliding scale. Hold Amaryl and Glucophage until I know she can tolerate a diet. 4. Essential hypertension. Continue lisinopril 5. Depression. On  Prozac    All the records are reviewed and case discussed with Care Management/Social Workerr. Management plans discussed with the patient, family and they are in agreement.  greater than 50% time was spent on face-to-face education, counseling and coordination of care  CODE STATUS: fc TOTAL TIME TAKING CARE OF THIS PATIENT: 35 minutes.   POSSIBLE D/C IN 2 DAYS, DEPENDING ON CLINICAL CONDITION.   Ramonita Lab M.D on 12/01/2015 at 1:11 PM  Between 7am to 6pm - Pager - [phone] After 6pm go to [email or web address] - password [email or web address] [facility]  [place] [facility]  Office  [phone]  CC: Primary care physician; Amy Diana Eves, NP

## 2015-12-01 NOTE — Care Management Note (Addendum)
Case Management Note  Patient Details  Name: Patricia [name] MRN: [id number] Date of Birth: 12/25/1965  Subjective/Objective:           Discussed discharge planning with Ms Patricia [name] and her daughter. On 11/24/15 49yo Mrs Patricia [name] received an I&D of her right septic knee by Dr Rosita KeaMenz. She resides at home. Both  her daughter and husband available to assist her with ADL and administrating IV antibiotics but both also work outside the home. Mrs Patricia [name] received placement of a 1 pigtail PICC line this morning in anticipation of returning home with IV antibiotics as soon as the results of her wound cultures are available. Ms Patricia [name] currently has no home health assistive equipment at home other than a pair of crutches. No home oxygen, no home health services. Case management will follow for discharge planning. Ms Patricia [name] was provided with a list of home health providers from which to choose.          Action/Plan:   Expected Discharge Date:  12/03/15               Expected Discharge Plan:     In-House Referral:     Discharge planning Services     Post Acute Care Choice:    Choice offered to:     DME Arranged:    DME Agency:     HH Arranged:    HH Agency:     Status of Service:     Medicare Important Message Given:    Date Medicare IM Given:    Medicare IM give by:    Date Additional Medicare IM Given:    Additional Medicare Important Message give by:     If discussed at Long Length of Stay Meetings, dates discussed:    Additional Comments:  Patricia [name] A, RN 12/01/2015, 12:54 PM

## 2015-12-01 NOTE — Progress Notes (Signed)
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 Pt with hypoxia. Unable to wean and coughed up blood. md orders chest xray

## 2015-12-01 NOTE — Progress Notes (Signed)
Pharmacy Antibiotic Note  Patricia [name] is a 50 y.o. female admitted on 11/30/2015 with sepsis/septic arthritis. Now found to have aspiration PNA.  Pharmacy has been consulted for Unasyn dosing by ID.  4/5 Body fluid Cxs- synovial: STREPTOCOCCUS MITIS/ORALIS (A)  Plan: Will start the patient on Unasyn 3g IV every 6 hours.   Pharmacy will continue to monitor and adjust as needed.   Height: 5\' 6"  (167.6 cm) Weight: 200 lb (90.719 kg) IBW/kg (Calculated) : 59.3  Temp (24hrs), Avg:98.6 F (37 C), Min:98.2 F (36.8 C), Max:99 F (37.2 C)   Recent Labs Lab 11/30/15 1511 12/01/15 0338  WBC 9.6  --   CREATININE 0.80 0.74    Estimated Creatinine Clearance: 94.4 mL/min (by C-G formula based on Cr of 0.74).    No Known Allergies  Antimicrobials this admission: 4/7 Vancomcyin >> 12/01/15 4/7 Zosyn  >> 12/01/15 4/8 Unasyn >>   Microbiology results: 4/5 Body fluid Cxs- synovial: STREPTOCOCCUS MITIS/ORALIS (A) 4/7 MRSA PCR: negative   Thank you for allowing pharmacy to be a part of this patient's care.  Cher NakaiSheema Shanena Pellegrino, PharmD Pharmacy Resident  12/01/2015 3:12 PM

## 2015-12-01 NOTE — Progress Notes (Signed)
ID E note Cx with strep mitis- a viridans strep - will need to wu for endocarditis Noted to have aspiration PNA as well  I would suggest stopping vanco - ordered Change zosyn to unasyn - ordered Check echo - ordered At Dc can change to IV ceftraixone 2 gm q 24 for the septic joint and if still needs anaerobic coverage can give a few days of clindamycin for aspiration.

## 2015-12-02 LAB — BASIC METABOLIC PANEL
Anion gap: 5 (ref 5–15)
BUN: 18 mg/dL (ref 6–20)
CALCIUM: 8.5 mg/dL — AB (ref 8.9–10.3)
CO2: 29 mmol/L (ref 22–32)
Chloride: 103 mmol/L (ref 101–111)
Creatinine, Ser: 0.77 mg/dL (ref 0.44–1.00)
GFR calc Af Amer: >60 mL/min (ref >60)
Glucose, Bld: 169 mg/dL — ABNORMAL HIGH (ref 65–99)
POTASSIUM: 4.2 mmol/L (ref 3.5–5.1)
SODIUM: 137 mmol/L (ref 135–145)

## 2015-12-02 LAB — CBC
HCT: 30.8 % — ABNORMAL LOW (ref 35.0–47.0)
Hemoglobin: 10.4 g/dL — ABNORMAL LOW (ref 12.0–16.0)
MCH: 28.7 pg (ref 26.0–34.0)
MCHC: 33.7 g/dL (ref 32.0–36.0)
MCV: 85.3 fL (ref 80.0–100.0)
PLATELETS: 383 10*3/uL (ref 150–440)
RBC: 3.61 MIL/uL — AB (ref 3.80–5.20)
RDW: 14.6 % — AB (ref 11.5–14.5)
WBC: 9.4 10*3/uL (ref 3.6–11.0)

## 2015-12-02 LAB — GLUCOSE, CAPILLARY
GLUCOSE-CAPILLARY: 159 mg/dL — AB (ref 65–99)
Glucose-Capillary: 146 mg/dL — ABNORMAL HIGH (ref 65–99)
Glucose-Capillary: 100 mg/dL — ABNORMAL HIGH (ref 65–99)
Glucose-Capillary: 180 mg/dL — ABNORMAL HIGH (ref 65–99)

## 2015-12-02 MED ORDER — DEXTROSE 5 % IV SOLN: 2.0000 g | INTRAVENOUS | Status: DC

## 2015-12-02 MED ORDER — ASPIRIN EC 325 MG PO TBEC: 325.0000 mg | DELAYED_RELEASE_TABLET | Freq: Every day | ORAL | Status: DC

## 2015-12-02 MED ORDER — SODIUM CHLORIDE 0.9% FLUSH: 10.0000 mL | INTRAVENOUS | Status: DC | PRN

## 2015-12-02 MED ORDER — OXYCODONE HCL 5 MG PO TABS: 5.0000 mg | ORAL_TABLET | ORAL | Status: DC | PRN

## 2015-12-02 NOTE — Progress Notes (Signed)
[facility] [place] [facility]   PATIENT NAME: Patricia [name]    MR#:  [id number]  DATE OF BIRTH:  04/06/1966  SUBJECTIVE:  CHIEF COMPLAINT:  Patient is feeling tired today. Cough and shortness of breath is better. Right knee hemovac removed . Denies any hemoptysis today. Daughter at bed side  REVIEW OF SYSTEMS:  CONSTITUTIONAL: No fever, fatigue or weakness.  EYES: No blurred or double vision.  EARS, NOSE, AND THROAT: No tinnitus or ear pain.  RESPIRATORY: Productive cough, improving shortness of breath, wheezing , reporting hemoptysis.  CARDIOVASCULAR: No chest pain, orthopnea, edema.  GASTROINTESTINAL: No nausea, vomiting, diarrhea or abdominal pain.  GENITOURINARY: No dysuria, hematuria.  ENDOCRINE: No polyuria, nocturia,  HEMATOLOGY: No anemia, easy bruising or bleeding SKIN: No rash or lesion. MUSCULOSKELETAL: Right knee pain and swelling NEUROLOGIC: No tingling, numbness, weakness.  PSYCHIATRY: No anxiety or depression.   DRUG ALLERGIES:  No Known Allergies  VITALS:  Blood pressure 138/70, pulse 99, temperature 100.4 F (38 C), temperature source Oral, resp. rate 14, height 5\' 6"  (1.676 m), weight 90.719 kg (200 lb), SpO2 95 %.  PHYSICAL EXAMINATION:  GENERAL:  50 y.o.-year-old patient lying in the bed with no acute distress.  EYES: Pupils equal, round, reactive to light and accommodation. No scleral icterus. Extraocular muscles intact.  HEENT: Head atraumatic, normocephalic. Oropharynx and nasopharynx clear.  NECK:  Supple, no jugular venous distention. No thyroid enlargement, no tenderness.  LUNGs: Moderate breath sounds bilaterally, no wheezing, rales,rhonchi or crepitation. No use of accessory muscles of respiration.  CARDIOVASCULAR: S1, S2 normal. No murmurs, rubs, or gallops.  ABDOMEN: Soft, nontender, nondistended. Bowel sounds present. No organomegaly or mass.  EXTREMITIES:  right knee status post  Wash. Clean dressing is present   No pedal edema, cyanosis, or clubbing.  NEUROLOGIC: Cranial nerves II through XII are intact. Muscle strength 5/5 in all extremities. Sensation intact. Gait not checked.  PSYCHIATRIC: The patient is alert and oriented x 3.  SKIN: No obvious rash, lesion, or ulcer.    LABORATORY PANEL:   CBC  Recent Labs Lab 12/02/15 0346  WBC 9.4  HGB 10.4*  HCT 30.8*  PLT 383   ------------------------------------------------------------------------------------------------------------------  Chemistries   Recent Labs Lab 12/02/15 0346  NA 137  K 4.2  CL 103  CO2 29  GLUCOSE 169*  BUN 18  CREATININE 0.77  CALCIUM 8.5*   ------------------------------------------------------------------------------------------------------------------  Cardiac Enzymes No results for input(s): TROPONINI in the last 168 hours. ------------------------------------------------------------------------------------------------------------------  RADIOLOGY:  Dg Chest 1 View  12/01/2015  CLINICAL DATA:  50 year old female, status post PICC EXAM: CHEST 1 VIEW COMPARISON:  12/01/2015 FINDINGS: Interval placement of right upper extremity PICC, terminating at the superior cavoatrial junction. Similar appearance of improving left upper lobe airspace disease. Unchanged appearance of cardiomediastinal silhouette. IMPRESSION: Interval placement right upper extremity PICC, appearing to terminate superior cavoatrial junction. Similar appearance of left upper airspace disease. Signed, Yvone NeuJaime S. Loreta AveWagner, DO [facility] Electronically Signed   By: Gilmer MorJaime  Wagner D.O.   On: 12/01/2015 11:43   Dg Chest 1 View  11/30/2015  CLINICAL DATA:  Postop from the surgery.  Questionable aspiration. EXAM: CHEST 1 VIEW COMPARISON:  03/08/2007 FINDINGS: There is new airspace opacity that projects in the left upper lobe extending from the hilum. This is consistent with aspiration pneumonitis.  Remainder of the lungs is clear. Heart, mediastinum and hila are unremarkable. No pleural effusion or pneumothorax. IMPRESSION: Left upper lobe airspace opacity consistent  with aspiration pneumonitis. Electronically Signed   By: Amie Portland M.D.   On: 11/30/2015 17:38   Dg Chest 2 View  12/01/2015  CLINICAL DATA:  One day post right knee arthroscopic surgery. Follow-up presumed aspiration pneumonia in the left upper lobe. Productive cough. EXAM: CHEST  2 VIEW COMPARISON:  11/30/2015 and earlier. FINDINGS: Patchy airspace opacities in the left upper lobe, with improved aeration since yesterday. Stable mild chronic elevation of the left hemidiaphragm with chronic scar/atelectasis in the left lower lobe. No new pulmonary parenchymal abnormalities. Cardiomediastinal silhouette unremarkable, unchanged. Degenerative changes involving the thoracic spine. IMPRESSION: Left upper lobe pneumonia, with improved aeration since yesterday. No new abnormalities. Electronically Signed   By: Hulan Saas M.D.   On: 12/01/2015 11:11    EKG:   Orders placed or performed in visit on 05/12/11  . EKG 12-Lead    ASSESSMENT AND PLAN:    1. Aspiration pneumonia.  Started Unasyn , d/scontinue vancomycin and Zosyn. Repeat chest x-ray with stable pneumonia which is not getting worse. Continue nebulizer treatment. Check sputum culture and sensitivity if sample is collected 2. Right knee septic joint.- strep mitis - cx  ID is recommending unasyn and can be changed to iv rocephin 2 gm q 24 hrs with clinda for few days for anaerobic coverage D/ced  Vancomycin .  PICC line placed in the right upper extremity  Appreciate  Infectious disease consult  Dr Sampson Goon. 3. Type 2 diabetes mellitus continue. Low-dose Lantus and sliding scale. Hold Amaryl and Glucophage until I know she can tolerate a diet. 4. Essential hypertension. Continue lisinopril 5. Depression. On Prozac  Consult PT   All the records are reviewed and  case discussed with Care Management/Social Workerr. Management plans discussed with the patient, family and they are in agreement.  greater than 50% time was spent on face-to-face education, counseling and coordination of care  CODE STATUS: fc TOTAL TIME TAKING CARE OF THIS PATIENT: 35 minutes.   POSSIBLE D/C IN 2 DAYS, DEPENDING ON CLINICAL CONDITION.   Ramonita Lab M.D on 12/02/2015 at 5:28 PM  Between 7am to 6pm - Pager - [phone] After 6pm go to [email or web address] - password [email or web address] [facility]  [place] [facility]  Office  [phone]  CC: Primary care physician; Amy Diana Eves, NP

## 2015-12-02 NOTE — Evaluation (Signed)
Physical Therapy Evaluation Patient Details Name: Patricia [name] MRN: [id number] DOB: [birth year] Today's Date: 12/02/2015   History of Present Illness  Pt with > 1 month of recent knee issues (on top of chronic arthritic pain).  She has been using crutches during this time with very painfull, limited ambulation.  She is now here post I&D of septic knee.    Clinical Impression  Pt is hypersensitive to any movement of R LE, but despite pain is motivated to try ambulation and does about 10 minutes of exercises apart from eval (issued HEP booklet as well).  Pt has been using crutches but will benefit from RW secondary to heavy reliance on UEs to unweight R LE and maintain safety.    Follow Up Recommendations Home health PT    Equipment Recommendations  Rolling walker with 5" wheels    Recommendations for Other Services       Precautions / Restrictions Precautions Precautions: Fall Restrictions Weight Bearing Restrictions: Yes (WBAT) RLE Weight Bearing: Weight bearing as tolerated      Mobility  Bed Mobility Overal bed mobility: Modified Independent             General bed mobility comments: Pt with guarded transition to EOB and needs CGA to lower R LE of EOB though pt is able to control the movement  Transfers Overall transfer level: Modified independent Equipment used: Rolling walker (2 wheeled)             General transfer comment: Pt able to rise but does not put a lot of weight through R LE and is hesitant and needs extra time and cuing/encouragement  Ambulation/Gait Ambulation/Gait assistance: Min guard Ambulation Distance (Feet): 15 Feet Assistive device: Rolling walker (2 wheeled)       General Gait Details: Pt very reliant taking weight through AD/UEs with R WBing, but is motivated to try walking despite pain.  She is pain limited and needs to turn around after minimal ambulation but had no safety issues of LOBs with the brief bout of walking.   On 2  liters pt's O2 drops from mid 90s to high 80s with the effort.  Stairs            Wheelchair Mobility    Modified Rankin (Stroke Patients Only)       Balance                                             Pertinent Vitals/Pain Pain Assessment: 0-10 Pain Score: 6  (9+/10 with any movement)    Home Living Family/patient expects to be discharged to:: Private residence Living Arrangements: Spouse/significant other;Children Available Help at Discharge: Family   Home Access: Level entry       Home Equipment: Crutches      Prior Function Level of Independence: Independent with assistive device(s)         Comments: Pt has been reliant on crutches for >1 month but has been able to go to work     Higher education careers adviser        Extremity/Trunk Assessment   Upper Extremity Assessment: Overall WFL for tasks assessed           Lower Extremity Assessment: RLE deficits/detail RLE Deficits / Details: Pt unable  to fully engage quad, lifts leg with knee bent.  very pain hesitant/guarded with all acts. PROM grossly 5-85.  Communication   Communication: No difficulties  Cognition Arousal/Alertness: Awake/alert Behavior During Therapy: WFL for tasks assessed/performed Overall Cognitive Status: Within Functional Limits for tasks assessed                      General Comments      Exercises General Exercises - Lower Extremity Ankle Circles/Pumps: AROM;10 reps Quad Sets: Strengthening;5 reps (pain limited) Heel Slides: 5 reps;AROM;AAROM Hip ABduction/ADduction: AROM;AAROM;5 reps Straight Leg Raises: AAROM;PROM;5 reps      Assessment/Plan    PT Assessment Patient needs continued PT services  PT Diagnosis Difficulty walking;Generalized weakness;Acute pain   PT Problem List Decreased strength;Decreased range of motion;Decreased activity tolerance;Decreased balance;Decreased mobility;Decreased coordination;Decreased knowledge of use of  DME;Decreased safety awareness;Pain  PT Treatment Interventions DME instruction;Gait training;Functional mobility training;Therapeutic activities;Therapeutic exercise;Balance training   PT Goals (Current goals can be found in the Care Plan section) Acute Rehab PT Goals Patient Stated Goal: Get back to walking well PT Goal Formulation: With patient/family Time For Goal Achievement: 12/09/15 Potential to Achieve Goals: Fair    Frequency 7X/week   Barriers to discharge        Co-evaluation               End of Session Equipment Utilized During Treatment: Gait belt;Oxygen Activity Tolerance: Patient limited by fatigue;Patient limited by pain Patient left: with bed alarm set;with family/visitor present;with call bell/phone within reach           Time: [id number] PT Time Calculation (min) (ACUTE ONLY): 33 min   Charges:   PT Evaluation $PT Eval Low Complexity: 1 Procedure PT Treatments $Therapeutic Exercise: 8-22 mins   PT G Codes:       Loran SentersGalen Bridgitt Raggio, PT, DPT [phone]  Malachi ProGalen R Holley Kocurek 12/02/2015, 5:31 PM

## 2015-12-02 NOTE — Progress Notes (Addendum)
   Subjective: 2 Days Post-Op Procedure(s) (LRB): ARTHROSCOPY KNEE (Right) Patient reports pain as moderate.   Patient is well, and has had no acute complaints or problems Denies any CP, SOB, ABD pain. We will continue therapy today.  Plan is to go Home after hospital stay.  Objective: Vital signs in last 24 hours: Temp:  [98.4 F (36.9 C)-99.2 F (37.3 C)] 99.2 F (37.3 C) (04/09 0731) Pulse Rate:  [93-108] 93 (04/09 0731) Resp:  [16-18] 16 (04/09 0731) BP: (112-153)/(54-73) 141/73 mmHg (04/09 0731) SpO2:  [95 %-98 %] 95 % (04/09 0731)  Intake/Output from previous day: 04/08 0701 - 04/09 0700 In: 2162 [P.O.:600; I.V.:1562] Out: 1550 [Urine:1550] Intake/Output this shift:     Recent Labs  11/30/15 1511 12/02/15 0346  HGB 11.5* 10.4*    Recent Labs  11/30/15 1511 12/02/15 0346  WBC 9.6 9.4  RBC 4.00 3.61*  HCT 34.2* 30.8*  PLT 426 383    Recent Labs  12/01/15 0338 12/01/15 1318 12/02/15 0346  NA 135  --  137  K 5.2* 4.4 4.2  CL 103  --  103  CO2 26  --  29  BUN 21*  --  18  CREATININE 0.74  --  0.77  GLUCOSE 265*  --  169*  CALCIUM 8.9  --  8.5*   No results for input(s): LABPT, INR in the last 72 hours.  EXAM General - Patient is Alert, Appropriate and Oriented  Lungs- Clear to auscultation bilaterally. No wheezing rales or rhonchi. No accessory muscle use, speaks full sentences. Extremity - Neurologically intact Neurovascular intact Sensation intact distally Intact pulses distally Dorsiflexion/Plantar flexion intact  Minimal effusion. No warmth or erythema Dressing - dressing C/D/I, no drainage and hemovac dc Motor Function - intact, moving foot and toes well on exam.   Past Medical History  Diagnosis Date  . Diabetes mellitus without complication (HCC)   . Hypertension   . Hyperlipidemia   . Anemia   . Depression     Assessment/Plan:   2 Days Post-Op Procedure(s) (LRB): ARTHROSCOPY KNEE (Right) Active Problems:   Septic  arthritis of knee, right (HCC)  Estimated body mass index is 32.3 kg/(m^2) as calculated from the following:   Height as of this encounter: 5\' 6"  (1.676 m).   Weight as of this encounter: 90.719 kg (200 lb). Advance diet Up with therapy Continue with IV abx, sensitivities pending Discharge home with IV abx pending medical clearance and reccommendations   DVT Prophylaxis - Lovenox, Foot Pumps and TED hose Weight-Bearing as tolerated to right leg D/C O2 and Pulse OX and try on Room Air  T. Cranston Neighborhris Gaines, PA-C [facility] 12/02/2015, 8:31 AM  Addendum: Cultures grew STREPTOCOCCUS MITIS/ORALIS  Change in antibiotic coverage noted. Appreciate Infectious Disease recommendations.  James P. Angie FavaHooten, Jr. M.D.

## 2015-12-02 NOTE — Discharge Summary (Signed)
Physician Discharge Summary  Patient ID: Patricia [name] MRN: [id number] DOB/AGE: [birth year] 51 y.o.  Admit date: 11/30/2015 Discharge date: 12/03/2015  Admission Diagnoses:  infected right knee   Discharge Diagnoses: Patient Active Problem List   Diagnosis Date Noted  . Septic arthritis of knee, right ([place]) 11/30/2015  . Hypertension 09/05/2015  . BPPV (benign paroxysmal positional vertigo) 09/05/2015  . Insomnia 09/05/2015  . 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 polyneuropathy, without long-term current use of insulin ([place]) 09/05/2015    Past Medical History  Diagnosis Date  . Diabetes mellitus without complication ([place])   . Hypertension   . Hyperlipidemia   . Anemia   . Depression      Transfusion: none   Consultants (if any): Treatment Team:  Loletha Grayer, MD Leonel Ramsay, MD  Discharged Condition: Improved  Hospital Course: Patricia [name] is an 50 y.o. female who was admitted 11/30/2015 with a diagnosis of right knee septic joint and went to the operating room on 11/30/2015 and underwent the above named procedures.    Surgeries: Procedure(s): ARTHROSCOPY KNEE on 11/30/2015 Patient tolerated the surgery well. Taken to PACU where she was stabilized and then transferred to the orthopedic floor.  Started on Lovenox 40 q 24 hrs. Foot pumps applied bilaterally at 80 mm. Heels elevated on bed with rolled towels. No evidence of DVT. Negative [place]. Physical therapy started on day #1 for gait training and transfer. OT started day #1 for ADL and assisted devices.  Patient's foley was d/c on day #1. On post op day 1 Chest xray showed concerns for aspiration PNA. Patient was started on abx.  Patient's IV and hemovac was d/c on day #2. On post op day 2 picc line was started and knee cultures showed Streptococcus Mitis/Oralis. ABx were changed to Rocephin IV.   On post op day #4 patient was stable and ready for discharge to home with home health. ID  recommended  weekly CBC with diff, CMP, CRP and fax results to FAX weekly labs to [phone]  Implants: none  She was given perioperative antibiotics:      Anti-infectives    Start     Dose/Rate Route Frequency Ordered Stop   12/03/15 0000  cefTRIAXone 2 g in dextrose 5 % 50 mL  Status:  Discontinued     2 g 100 mL/hr over 30 Minutes Intravenous Every 24 hours 12/02/15 1144 12/03/15    12/03/15 0000  cefTRIAXone 2 g in dextrose 5 % 50 mL     2 g 100 mL/hr over 30 Minutes Intravenous Every 24 hours 12/03/15 1153 12/18/15 2359   12/03/15 0000  clindamycin (CLEOCIN) 300 MG capsule  Status:  Discontinued     300 mg Oral 4 times daily 12/03/15 1153 12/03/15    12/03/15 0000  clindamycin (CLEOCIN) 300 MG capsule     300 mg Oral 4 times daily 12/03/15 1154     12/01/15 1800  Ampicillin-Sulbactam (UNASYN) 3 g in sodium chloride 0.9 % 100 mL IVPB     3 g 100 mL/hr over 60 Minutes Intravenous Every 6 hours 12/01/15 1510     12/01/15 1600  Ampicillin-Sulbactam (UNASYN) 3 g in sodium chloride 0.9 % 100 mL IVPB  Status:  Discontinued     3 g 100 mL/hr over 60 Minutes Intravenous Every 6 hours 12/01/15 1507 12/01/15 1510   11/30/15 2300  vancomycin (VANCOCIN) 1,500 mg in sodium chloride 0.9 % 500 mL IVPB  Status:  Discontinued  1,500 mg 250 mL/hr over 120 Minutes Intravenous Every 12 hours 11/30/15 1908 12/01/15 1456   11/30/15 2100  piperacillin-tazobactam (ZOSYN) IVPB 3.375 g  Status:  Discontinued     3.375 g 12.5 mL/hr over 240 Minutes Intravenous 3 times per day 11/30/15 1933 12/01/15 1456   11/30/15 1930  cefTRIAXone (ROCEPHIN) 2 g in dextrose 5 % 50 mL IVPB  Status:  Discontinued     2 g 100 mL/hr over 30 Minutes Intravenous Every 24 hours 11/30/15 1918 11/30/15 1930   11/30/15 1900  vancomycin (VANCOCIN) 500 mg in sodium chloride 0.9 % 100 mL IVPB     500 mg 100 mL/hr over 60 Minutes Intravenous  Once 11/30/15 1859 11/30/15 2114   11/30/15 1515  vancomycin (VANCOCIN) IVPB 1000  mg/200 mL premix     1,000 mg 200 mL/hr over 60 Minutes Intravenous  Once 11/30/15 1515 11/30/15 1646    .  She was given sequential compression devices, early ambulation, and aspirin for DVT prophylaxis.  She benefited maximally from the hospital stay and there were no complications.    Recent vital signs:  Filed Vitals:   12/03/15 0728 12/03/15 1027  BP: 128/67   Pulse: 95 103  Temp: 98.5 F (36.9 C)   Resp: 20     Recent laboratory studies:  Lab Results  Component Value Date   HGB 10.4* 12/03/2015   HGB 10.4* 12/02/2015   HGB 11.5* 11/30/2015   Lab Results  Component Value Date   WBC 8.7 12/03/2015   PLT 375 12/03/2015   No results found for: INR Lab Results  Component Value Date   NA 137 12/03/2015   K 4.0 12/03/2015   CL 104 12/03/2015   CO2 30 12/03/2015   BUN 20 12/03/2015   CREATININE 0.79 12/03/2015   GLUCOSE 163* 12/03/2015    Discharge Medications:     Medication List    TAKE these medications        aspirin EC 325 MG tablet  Take 1 tablet (325 mg total) by mouth daily.     blood glucose meter kit and supplies Kit  Dispense based on patient and insurance preference. Use up to four times daily as directed. (FOR ICD-9 250.00, 250.01).     cefTRIAXone 2 g in dextrose 5 % 50 mL  Inject 2 g into the vein daily. X 21 days     clindamycin 300 MG capsule  Commonly known as:  CLEOCIN  Take 1 capsule (300 mg total) by mouth 4 (four) times daily. X 5 days     FLUoxetine 40 MG capsule  Commonly known as:  PROZAC  Take 1 capsule (40 mg total) by mouth daily.     furosemide 20 MG tablet  Commonly known as:  LASIX  Take 0.5 tablets (10 mg total) by mouth daily. For 1 week.     glimepiride 4 MG tablet  Commonly known as:  AMARYL  Take 2 tablets (8 mg total) by mouth daily with breakfast.     Insulin Glargine 300 UNIT/ML Sopn  Commonly known as:  TOUJEO SOLOSTAR  Inject 10 Units into the skin at bedtime.     Insulin Pen Needle 29G X 12.7MM Misc   1 each by Does not apply route daily.     lisinopril 20 MG tablet  Commonly known as:  PRINIVIL,ZESTRIL  Take 1 tablet (20 mg total) by mouth daily.     meclizine 32 MG tablet  Commonly known as:  ANTIVERT  Take  1 tablet (32 mg total) by mouth 3 (three) times daily as needed.     Melatonin 3 MG Caps  Take 2 capsules (6 mg total) by mouth daily.     metformin 1000 MG (OSM) 24 hr tablet  Commonly known as:  FORTAMET  Take 1 tablet (1,000 mg total) by mouth 2 (two) times daily.     metoprolol tartrate 25 MG tablet  Commonly known as:  LOPRESSOR  Take 1 tablet (25 mg total) by mouth 2 (two) times daily.     ONE TOUCH ULTRA TEST test strip  Generic drug:  glucose blood  CHECK BLOOD GLUCOSE UP TO FOUR TIMES DAILY AS DIRECTED     ONETOUCH DELICA LANCETS 10R Misc     oxyCODONE 5 MG immediate release tablet  Commonly known as:  Oxy IR/ROXICODONE  Take 1-2 tablets (5-10 mg total) by mouth every 3 (three) hours as needed for breakthrough pain.     sodium chloride flush 0.9 % Soln  Commonly known as:  NS  10-40 mLs by Intracatheter route as needed (flush).        Diagnostic Studies: Dg Chest 1 View  12/01/2015  CLINICAL DATA:  50 year old female, status post PICC EXAM: CHEST 1 VIEW COMPARISON:  12/01/2015 FINDINGS: Interval placement of right upper extremity PICC, terminating at the superior cavoatrial junction. Similar appearance of improving left upper lobe airspace disease. Unchanged appearance of cardiomediastinal silhouette. IMPRESSION: Interval placement right upper extremity PICC, appearing to terminate superior cavoatrial junction. Similar appearance of left upper airspace disease. Signed, Dulcy Fanny. Earleen Newport, DO [facility] Electronically Signed   By: Corrie Mckusick D.O.   On: 12/01/2015 11:43   Dg Chest 1 View  11/30/2015  CLINICAL DATA:  Postop from the surgery.  Questionable aspiration. EXAM: CHEST 1 VIEW COMPARISON:   03/08/2007 FINDINGS: There is new airspace opacity that projects in the left upper lobe extending from the hilum. This is consistent with aspiration pneumonitis. Remainder of the lungs is clear. Heart, mediastinum and hila are unremarkable. No pleural effusion or pneumothorax. IMPRESSION: Left upper lobe airspace opacity consistent with aspiration pneumonitis. Electronically Signed   By: Lajean Manes M.D.   On: 11/30/2015 17:38   Dg Chest 2 View  12/01/2015  CLINICAL DATA:  One day post right knee arthroscopic surgery. Follow-up presumed aspiration pneumonia in the left upper lobe. Productive cough. EXAM: CHEST  2 VIEW COMPARISON:  11/30/2015 and earlier. FINDINGS: Patchy airspace opacities in the left upper lobe, with improved aeration since yesterday. Stable mild chronic elevation of the left hemidiaphragm with chronic scar/atelectasis in the left lower lobe. No new pulmonary parenchymal abnormalities. Cardiomediastinal silhouette unremarkable, unchanged. Degenerative changes involving the thoracic spine. IMPRESSION: Left upper lobe pneumonia, with improved aeration since yesterday. No new abnormalities. Electronically Signed   By: Evangeline Dakin M.D.   On: 12/01/2015 11:11    Disposition: home with home health nursing    Follow-up Information    Follow up with MENZ,MICHAEL, MD In 2 weeks.   Specialty:  Orthopedic Surgery   Why:  For wound re-check, Return to ER for worsening symptoms   Contact information:   [place] [address] [phone]        Signed: [place] 12/03/2015, 11:59 AM

## 2015-12-03 ENCOUNTER — Encounter: Payer: Self-pay | Admitting: Orthopedic Surgery

## 2015-12-03 LAB — CBC
HCT: 31.2 % — ABNORMAL LOW (ref 35.0–47.0)
Hemoglobin: 10.4 g/dL — ABNORMAL LOW (ref 12.0–16.0)
MCH: 29 pg (ref 26.0–34.0)
MCHC: 33.4 g/dL (ref 32.0–36.0)
MCV: 86.8 fL (ref 80.0–100.0)
Platelets: 375 10*3/uL (ref 150–440)
RBC: 3.6 MIL/uL — ABNORMAL LOW (ref 3.80–5.20)
RDW: 14.1 % (ref 11.5–14.5)
WBC: 8.7 10*3/uL (ref 3.6–11.0)

## 2015-12-03 LAB — BASIC METABOLIC PANEL
Anion gap: 3 — ABNORMAL LOW (ref 5–15)
BUN: 20 mg/dL (ref 6–20)
CALCIUM: 8.7 mg/dL — AB (ref 8.9–10.3)
CO2: 30 mmol/L (ref 22–32)
CREATININE: 0.79 mg/dL (ref 0.44–1.00)
Chloride: 104 mmol/L (ref 101–111)
Glucose, Bld: 163 mg/dL — ABNORMAL HIGH (ref 65–99)
Potassium: 4 mmol/L (ref 3.5–5.1)
SODIUM: 137 mmol/L (ref 135–145)

## 2015-12-03 LAB — GLUCOSE, CAPILLARY
GLUCOSE-CAPILLARY: 157 mg/dL — AB (ref 65–99)
GLUCOSE-CAPILLARY: 278 mg/dL — AB (ref 65–99)

## 2015-12-03 MED ORDER — DEXTROSE 5 % IV SOLN
2.0000 g | Freq: Once | INTRAVENOUS | Status: AC
  Administered 2015-12-03: 2 g via INTRAVENOUS
  Filled 2015-12-03: qty 2

## 2015-12-03 MED ORDER — SODIUM CHLORIDE 0.9% FLUSH
10.0000 mL | INTRAVENOUS | Status: DC | PRN
Start: 2015-12-03 — End: 2016-07-18

## 2015-12-03 MED ORDER — CLINDAMYCIN HCL 300 MG PO CAPS: 300.0000 mg | ORAL_CAPSULE | Freq: Four times a day (QID) | ORAL | Status: DC

## 2015-12-03 MED ORDER — FAMOTIDINE 20 MG PO TABS
20.0000 mg | ORAL_TABLET | Freq: Every day | ORAL | Status: DC
  Administered 2015-12-03: 20 mg via ORAL
  Filled 2015-12-03: qty 1

## 2015-12-03 MED ORDER — DEXTROSE 5 % IV SOLN: 2.0000 g | INTRAVENOUS | Status: AC

## 2015-12-03 MED ORDER — OXYCODONE HCL 5 MG PO TABS: 5.0000 mg | ORAL_TABLET | ORAL | Status: DC | PRN

## 2015-12-03 NOTE — Progress Notes (Signed)
Physical Therapy Treatment Patient Details Name: Patricia [name] MRN: [id number] DOB: 12/20/1965 Today's Date: 12/03/2015    History of Present Illness Pt with > 1 month of recent knee issues (on top of chronic arthritic pain).  She has been using crutches during this time with very painfull, limited ambulation.  She is now here post I&D of septic knee.      PT Comments    Initial attempt for treatment this morning finds pt using the bathroom. Second attempt, pt agreeable to PT. Ambulation/weightbearing through Right lower extremity difficulty/antalgic. Pt requires increased cueing instruction with initial walk for appropriate step lengths to assist with pain control and weightbearing on right as well as body position in regards to rolling walker. Ambulation slow and effortful with increased shallow breathing and pain distress. Pt with mild lightheadedness after 20 ft; pt assisted to sit with noted improved O2 saturation on room air to 97% from 92%; however, hear rate increased to 153 beats per minute from 103 beats at rest. Pt monitored in chair until returned to baseline over several minutes. Pt transferred from visitor chair to recliner; however, pt then has need to use the bathroom for another bowel movement. Pt ambulates 10 ft to bedside commode in bathroom; requires set up and Min A for personal hygiene with toileting. Pt able to ambulate back to recliner (10 ft) with pain, but improved smaller steps and controlled pain reaction. Pt instructed in long sit exercises and encouraged to perform throughout the day as well as incentive spirometer. Recommendation for discharge may need to be changed to skilled nursing facility if ambulation distance/ability does not improve.   Follow Up Recommendations        Equipment Recommendations       Recommendations for Other Services       Precautions / Restrictions Precautions Precautions: Fall Restrictions Weight Bearing Restrictions: Yes RLE  Weight Bearing: Weight bearing as tolerated    Mobility  Bed Mobility Overal bed mobility: Modified Independent             General bed mobility comments: slow guarded movement; use of rails  Transfers Overall transfer level: Modified independent Equipment used: Rolling walker (2 wheeled)             General transfer comment: slow to rise, antalgic; decreased ability to use RLE; pain with weightbearing  Ambulation/Gait Ambulation/Gait assistance: Min guard Ambulation Distance (Feet): 20 Feet (secon and third walks 10 ft each) Assistive device: Rolling walker (2 wheeled) Gait Pattern/deviations: Step-to pattern;Step-through pattern;Decreased step length - left;Decreased stance time - right;Decreased weight shift to right;Antalgic;Trunk flexed Gait velocity: reduced Gait velocity interpretation: <1.8 ft/sec, indicative of risk for recurrent falls General Gait Details: Difficult/effortful and painful ambulation. Requires cues for proper sequencing and distance from rw as well as instruction to ease stress on RLE. Despite cues, pts breathing becomes shallow and rapid with complaints of lightheadedness. Pt assisted to sit in a chair. Heart rate 153 with O2 saturation 97%.    Stairs            Wheelchair Mobility    Modified Rankin (Stroke Patients Only)       Balance Overall balance assessment: No apparent balance deficits (not formally assessed)                                  Cognition Arousal/Alertness: Awake/alert Behavior During Therapy: WFL for tasks assessed/performed Overall Cognitive Status: Within Functional  Limits for tasks assessed                      Exercises General Exercises - Lower Extremity Ankle Circles/Pumps: AROM;Both;20 reps (long sit) Quad Sets: Strengthening;Both;10 reps (long sit) Gluteal Sets: Strengthening;Both;10 reps (long sit) Long Arc Quad: AROM;Right;5 reps;Seated Heel Slides: AAROM;Right;5 reps (long  sit; self assist around thigh) Hip Flexion/Marching: AROM;Both;10 reps;Seated    General Comments        Pertinent Vitals/Pain Pain Assessment: 0-10 Pain Score: 6  Pain Location: R knee especially medially and to foot Pain Descriptors / Indicators: Aching;Constant Pain Intervention(s): Limited activity within patient's tolerance;Monitored during session;Repositioned;Patient requesting pain meds-RN notified;RN gave pain meds during session    Home Living                      Prior Function            PT Goals (current goals can now be found in the care plan section) Progress towards PT goals: Progressing toward goals (slowly)    Frequency  7X/week    PT Plan Current plan remains appropriate    Co-evaluation             End of Session   Activity Tolerance: Patient limited by pain Patient left: in chair;with call bell/phone within reach;with chair alarm set;with family/visitor present;with SCD's reapplied     Time: 1610-9604 PT Time Calculation (min) (ACUTE ONLY): 42 min  Charges:  $Gait Training: 8-22 mins $Therapeutic Exercise: 8-22 mins $Therapeutic Activity: 8-22 mins                    G Codes:      Kristeen Miss, PTA 12/03/2015, 11:30 AM

## 2015-12-03 NOTE — Consult Note (Signed)
[facility] Infectious Disease     Reason for Consult: Septic knee    Referring Physician: Dr Rudene Christians Date of Admission:  11/30/2015   Active Problems:   Septic arthritis of knee, right (HCC)   HPI: Patricia [name] is a 50 y.o. female with DM poorly controlled, OA admitted from ortho clinic with septic arthritis. She had received steroid injection March 1st but still had pain so had synvisc injection on March 24th. However knee pain became worse and had aspiration done 4/5 with 32K wbc, 95 % PMNS, and culture turned + for GPC. She underwent washout 4/7 and had possible aspiration event at that time. Started on vanco and zosyn. Cx grew Strep mitis. Changed to unasyn. Picc placed. Currently knee still painful but improving. No fevers  Past Medical History  Diagnosis Date  . Diabetes mellitus without complication ([place])   . Hypertension   . Hyperlipidemia   . Anemia   . Depression    Past Surgical History  Procedure Laterality Date  . Mrsa abdominal wall    . Cholecystectomy    . Right knee arthroscopic    . Knee arthroscopy Right 11/30/2015    Procedure: ARTHROSCOPY KNEE;  Surgeon: Hessie Knows, MD;  Location: [facility] ORS;  Service: Orthopedics;  Laterality: Right;   Social History  Substance Use Topics  . Smoking status: Never Smoker   . Smokeless tobacco: None  . Alcohol Use: No   Family History  Problem Relation Age of Onset  . Diabetes Mother   . CAD Mother   . Diabetes Father   . Hyperlipidemia Father     Allergies: No Known Allergies  Current antibiotics: Antibiotics Given (last 72 hours)    Date/Time Action Medication Dose Rate   11/30/15 1546 Given   vancomycin (VANCOCIN) IVPB 1000 mg/200 mL premix 1,000 mg 200 mL/hr   11/30/15 2014 Given   vancomycin (VANCOCIN) 500 mg in sodium chloride 0.9 % 100 mL IVPB 500 mg 100 mL/hr   11/30/15 2201 Given  [IV traffic]   piperacillin-tazobactam (ZOSYN) IVPB 3.375 g 3.375 g 12.5 mL/hr   11/30/15 2354 Given   vancomycin  (VANCOCIN) 1,500 mg in sodium chloride 0.9 % 500 mL IVPB 1,500 mg 250 mL/hr   12/01/15 0505 Given   piperacillin-tazobactam (ZOSYN) IVPB 3.375 g 3.375 g 12.5 mL/hr   12/01/15 1216 Given   vancomycin (VANCOCIN) 1,500 mg in sodium chloride 0.9 % 500 mL IVPB 1,500 mg 250 mL/hr   12/01/15 1400 Given   piperacillin-tazobactam (ZOSYN) IVPB 3.375 g 3.375 g 12.5 mL/hr   12/01/15 1634 Given   Ampicillin-Sulbactam (UNASYN) 3 g in sodium chloride 0.9 % 100 mL IVPB 3 g 100 mL/hr   12/02/15 0045 Given   Ampicillin-Sulbactam (UNASYN) 3 g in sodium chloride 0.9 % 100 mL IVPB 3 g 100 mL/hr   12/02/15 0457 Given   Ampicillin-Sulbactam (UNASYN) 3 g in sodium chloride 0.9 % 100 mL IVPB 3 g 100 mL/hr   12/02/15 1234 Given   Ampicillin-Sulbactam (UNASYN) 3 g in sodium chloride 0.9 % 100 mL IVPB 3 g 100 mL/hr   12/02/15 1746 Given   Ampicillin-Sulbactam (UNASYN) 3 g in sodium chloride 0.9 % 100 mL IVPB 3 g 100 mL/hr   12/03/15 0028 Given   Ampicillin-Sulbactam (UNASYN) 3 g in sodium chloride 0.9 % 100 mL IVPB 3 g 100 mL/hr   12/03/15 0543 Given   Ampicillin-Sulbactam (UNASYN) 3 g in sodium chloride 0.9 % 100 mL IVPB 3 g 100 mL/hr  12/03/15 1142 Given   Ampicillin-Sulbactam (UNASYN) 3 g in sodium chloride 0.9 % 100 mL IVPB 3 g 100 mL/hr   12/03/15 1251 Given   cefTRIAXone (ROCEPHIN) 2 g in dextrose 5 % 50 mL IVPB 2 g 100 mL/hr      MEDICATIONS: . ampicillin-sulbactam (UNASYN) IV  3 g Intravenous Q6H  . aspirin EC  81 mg Oral Daily  . docusate sodium  100 mg Oral BID  . enoxaparin (LOVENOX) injection  40 mg Subcutaneous Q24H  . famotidine  20 mg Oral Daily  . FLUoxetine  40 mg Oral Daily  . insulin aspart  0-15 Units Subcutaneous TID WC  . insulin aspart  0-5 Units Subcutaneous QHS  . insulin glargine  10 Units Subcutaneous QHS  . lisinopril  20 mg Oral Daily  . metoprolol tartrate  25 mg Oral BID    Review of Systems - 11 systems reviewed and negative per HPI   OBJECTIVE: Temp:  [98.5 F  (36.9 C)-100.4 F (38 C)] 98.5 F (36.9 C) (04/10 0728) Pulse Rate:  [90-103] 103 (04/10 1027) Resp:  [14-20] 20 (04/10 0728) BP: (115-138)/(64-70) 128/67 mmHg (04/10 0728) SpO2:  [92 %-98 %] 92 % (04/10 1027) Physical Exam  Constitutional:  oriented to person, place, and time. appears well-developed and well-nourished. No distress.  HENT: [place]/AT, PERRLA, no scleral icterus Mouth/Throat: Oropharynx is clear and moist. No oropharyngeal exudate.  Cardiovascular: Normal rate, regular rhythm and normal heart sounds.  Pulmonary/Chest: Effort normal and breath sounds normal. No respiratory distress.  has no wheezes.  Neck = supple, no nuchal rigidity Abdominal: Soft. Bowel sounds are normal.  exhibits no distension. There is no tenderness.  Lymphadenopathy: no cervical adenopathy. No axillary adenopathy Neurological: alert and oriented to person, place, and time.  Skin: Skin is warm and dry. No rash noted. No erythema.  Psychiatric: a normal mood and affect.  behavior is normal.  Ext R knee with bandages, some pain with ROM, mild warmth  LABS: Results for orders placed or performed during the hospital encounter of 11/30/15 (from the past 48 hour(s))  Glucose, capillary     Status: Abnormal   Collection Time: 12/01/15  4:01 PM  Result Value Ref Range   Glucose-Capillary 146 (H) 65 - 99 mg/dL   Comment 1 Notify RN   Glucose, capillary     Status: Abnormal   Collection Time: 12/01/15  9:08 PM  Result Value Ref Range   Glucose-Capillary 219 (H) 65 - 99 mg/dL   Comment 1 Notify RN   CBC     Status: Abnormal   Collection Time: 12/02/15  3:46 AM  Result Value Ref Range   WBC 9.4 3.6 - 11.0 K/uL   RBC 3.61 (L) 3.80 - 5.20 MIL/uL   Hemoglobin 10.4 (L) 12.0 - 16.0 g/dL   HCT 30.8 (L) 35.0 - 47.0 %   MCV 85.3 80.0 - 100.0 fL   MCH 28.7 26.0 - 34.0 pg   MCHC 33.7 32.0 - 36.0 g/dL   RDW 14.6 (H) 11.5 - 14.5 %   Platelets 383 150 - 440 K/uL  Basic metabolic panel     Status: Abnormal    Collection Time: 12/02/15  3:46 AM  Result Value Ref Range   Sodium 137 135 - 145 mmol/L    Comment: RESULTS VERIFIED BY REPEAT TESTING   Potassium 4.2 3.5 - 5.1 mmol/L   Chloride 103 101 - 111 mmol/L   CO2 29 22 - 32 mmol/L   Glucose, Bld  169 (H) 65 - 99 mg/dL   BUN 18 6 - 20 mg/dL   Creatinine, Ser 0.77 0.44 - 1.00 mg/dL   Calcium 8.5 (L) 8.9 - 10.3 mg/dL   GFR calc non Af Amer >60 >60 mL/min   GFR calc Af Amer >60 >60 mL/min    Comment: (NOTE) The eGFR has been calculated using the CKD EPI equation. This calculation has not been validated in all clinical situations. eGFR's persistently <60 mL/min signify possible Chronic Kidney Disease.    Anion gap 5 5 - 15  Glucose, capillary     Status: Abnormal   Collection Time: 12/02/15  7:33 AM  Result Value Ref Range   Glucose-Capillary 146 (H) 65 - 99 mg/dL   Comment 1 Notify RN   Glucose, capillary     Status: Abnormal   Collection Time: 12/02/15 11:20 AM  Result Value Ref Range   Glucose-Capillary 100 (H) 65 - 99 mg/dL  Glucose, capillary     Status: Abnormal   Collection Time: 12/02/15  4:33 PM  Result Value Ref Range   Glucose-Capillary 159 (H) 65 - 99 mg/dL   Comment 1 Notify RN   Glucose, capillary     Status: Abnormal   Collection Time: 12/02/15  8:47 PM  Result Value Ref Range   Glucose-Capillary 180 (H) 65 - 99 mg/dL  CBC     Status: Abnormal   Collection Time: 12/03/15  4:25 AM  Result Value Ref Range   WBC 8.7 3.6 - 11.0 K/uL   RBC 3.60 (L) 3.80 - 5.20 MIL/uL   Hemoglobin 10.4 (L) 12.0 - 16.0 g/dL   HCT 31.2 (L) 35.0 - 47.0 %   MCV 86.8 80.0 - 100.0 fL   MCH 29.0 26.0 - 34.0 pg   MCHC 33.4 32.0 - 36.0 g/dL   RDW 14.1 11.5 - 14.5 %   Platelets 375 150 - 440 K/uL  Basic metabolic panel     Status: Abnormal   Collection Time: 12/03/15  4:25 AM  Result Value Ref Range   Sodium 137 135 - 145 mmol/L   Potassium 4.0 3.5 - 5.1 mmol/L   Chloride 104 101 - 111 mmol/L   CO2 30 22 - 32 mmol/L   Glucose, Bld 163 (H)  65 - 99 mg/dL   BUN 20 6 - 20 mg/dL   Creatinine, Ser 0.79 0.44 - 1.00 mg/dL   Calcium 8.7 (L) 8.9 - 10.3 mg/dL   GFR calc non Af Amer >60 >60 mL/min   GFR calc Af Amer >60 >60 mL/min    Comment: (NOTE) The eGFR has been calculated using the CKD EPI equation. This calculation has not been validated in all clinical situations. eGFR's persistently <60 mL/min signify possible Chronic Kidney Disease.    Anion gap 3 (L) 5 - 15  Glucose, capillary     Status: Abnormal   Collection Time: 12/03/15  7:26 AM  Result Value Ref Range   Glucose-Capillary 157 (H) 65 - 99 mg/dL  Glucose, capillary     Status: Abnormal   Collection Time: 12/03/15 11:34 AM  Result Value Ref Range   Glucose-Capillary 278 (H) 65 - 99 mg/dL   No components found for: ESR, C REACTIVE PROTEIN MICRO: Recent Results (from the past 720 hour(s))  Culture, body fluid-bottle     Status: Abnormal   Collection Time: 11/28/15 11:27 AM  Result Value Ref Range Status   Specimen Description SYNOVIAL  Final   Special Requests NONE  Final  Gram Stain GRAM POSITIVE COCCI IN CHAINS MODERATE WBC SEEN   Final   Culture STREPTOCOCCUS MITIS/ORALIS (A)  Final   Report Status 12/01/2015 FINAL  Final   Organism ID, Bacteria STREPTOCOCCUS MITIS/ORALIS  Final      Susceptibility   Streptococcus mitis/oralis - MIC*    ERYTHROMYCIN >=8 RESISTANT Resistant     TETRACYCLINE >=16 RESISTANT Resistant     VANCOMYCIN 0.5 SENSITIVE Sensitive     CLINDAMYCIN >=1 RESISTANT Resistant     CEFTRIAXONE Value in next row Sensitive      SENSITIVE0.25    AMPICILLIN Value in next row Sensitive      SENSITIVE<=0.25    * STREPTOCOCCUS MITIS/ORALIS  Surgical pcr screen     Status: None   Collection Time: 11/30/15  3:12 PM  Result Value Ref Range Status   MRSA, PCR NEGATIVE NEGATIVE Final   Staphylococcus aureus NEGATIVE NEGATIVE Final    Comment:        The Xpert SA Assay (FDA approved for NASAL specimens in patients over 35 years of age), is  one component of a comprehensive surveillance program.  Test performance has been validated by [facility] for patients greater than or equal to 63 year old. It is not intended to diagnose infection nor to guide or monitor treatment.     IMAGING: Dg Chest 1 View  12/01/2015  CLINICAL DATA:  50 year old female, status post PICC EXAM: CHEST 1 VIEW COMPARISON:  12/01/2015 FINDINGS: Interval placement of right upper extremity PICC, terminating at the superior cavoatrial junction. Similar appearance of improving left upper lobe airspace disease. Unchanged appearance of cardiomediastinal silhouette. IMPRESSION: Interval placement right upper extremity PICC, appearing to terminate superior cavoatrial junction. Similar appearance of left upper airspace disease. Signed, Dulcy Fanny. Earleen Newport, DO [facility] Electronically Signed   By: Corrie Mckusick D.O.   On: 12/01/2015 11:43   Dg Chest 1 View  11/30/2015  CLINICAL DATA:  Postop from the surgery.  Questionable aspiration. EXAM: CHEST 1 VIEW COMPARISON:  03/08/2007 FINDINGS: There is new airspace opacity that projects in the left upper lobe extending from the hilum. This is consistent with aspiration pneumonitis. Remainder of the lungs is clear. Heart, mediastinum and hila are unremarkable. No pleural effusion or pneumothorax. IMPRESSION: Left upper lobe airspace opacity consistent with aspiration pneumonitis. Electronically Signed   By: Lajean Manes M.D.   On: 11/30/2015 17:38   Dg Chest 2 View  12/01/2015  CLINICAL DATA:  One day post right knee arthroscopic surgery. Follow-up presumed aspiration pneumonia in the left upper lobe. Productive cough. EXAM: CHEST  2 VIEW COMPARISON:  11/30/2015 and earlier. FINDINGS: Patchy airspace opacities in the left upper lobe, with improved aeration since yesterday. Stable mild chronic elevation of the left hemidiaphragm with chronic scar/atelectasis in the left lower  lobe. No new pulmonary parenchymal abnormalities. Cardiomediastinal silhouette unremarkable, unchanged. Degenerative changes involving the thoracic spine. IMPRESSION: Left upper lobe pneumonia, with improved aeration since yesterday. No new abnormalities. Electronically Signed   By: Evangeline Dakin M.D.   On: 12/01/2015 11:11   Echo  - Procedure narrative: Transthoracic echocardiography. Image  quality was poor. The study was technically difficult, as a  result of poor acoustic windows and poor sound wave transmission. - Left ventricle: The cavity size was normal. Systolic function was  normal. The estimated ejection fraction was in the range of 60%  to 65%. Wall motion was normal; there were no regional wall  motion abnormalities. Doppler parameters  are consistent with  abnormal left ventricular relaxation (grade 1 diastolic  dysfunction). - Left atrium: The atrium was normal in size. - Right ventricle: Systolic function was normal. - Pulmonary arteries: Systolic pressure was within the normal  range.  Impressions:  - Normal study. No valve vegetation noted. Assessment:   Patricia [name] is a 50 y.o. female with Strep mitis R septic knee in setting of OA and recent steroid then synvisc injection.  ESR 50, crp 6.6.  Recommendations Would rec ceftriaxone 2 gm q 24 for 2-3 weeks I will see at that time and transition to orals if improving (was sensitive to ampicillin/amoxicillin) Weekly labs Finish clindamycin for aspiration pna - 5 days total Thank you very much for allowing me to participate in the care of this patient. Please call with questions.   Cheral Marker. Ola Spurr, MD

## 2015-12-03 NOTE — Progress Notes (Signed)
Clinical Child psychotherapistocial Worker (CSW) received SNF consult. PT is recommending home health. RN Case Manager is aware of above per note. Please reconsult if future social work needs arise. CSW signing off.   Jetta LoutBailey Morgan, LCSW [phone]

## 2015-12-03 NOTE — Discharge Instructions (Signed)
Diet: As you were doing prior to hospitalization   Shower:  May shower but must wear waterproof band aids. Keep incision sites clean and dry.   Dressing:  Change band aids daily.    Activity:  Increase activity slowly as tolerated, but follow the weight bearing instructions below.  No lifting or driving for 6 weeks.  Weight Bearing:   Weight bearing as tolerated to rightlower extremity  To prevent constipation: you may use a stool softener such as -  Colace (over the counter) 100 mg by mouth twice a day  Drink plenty of fluids (prune juice may be helpful) and high fiber foods Miralax (over the counter) for constipation as needed.    Itching:  If you experience itching with your medications, try taking only a single pain pill, or even half a pain pill at a time.  You may take up to 10 pain pills per day, and you can also use benadryl over the counter for itching or also to help with sleep.   Precautions:  If you experience chest pain or shortness of breath - call 911 immediately for transfer to the hospital emergency department!!  If you develop a fever greater that 101 F, purulent drainage from wound, increased redness or drainage from wound, or calf pain-Call [facility]                                              Follow- Up Appointment:  Please call [facility]  Please check weekly CBC with diff, CMP, CRP and fax results to FAX weekly labs to [phone]

## 2015-12-03 NOTE — Progress Notes (Addendum)
   Subjective: 3 Days Post-Op Procedure(s) (LRB): ARTHROSCOPY KNEE (Right) Patient reports pain as moderate.   Patient is well, and has had no acute complaints or problems Denies any CP, SOB, ABD pain.  We will continue therapy today.  Plan is to go Home after hospital stay.  Objective: Vital signs in last 24 hours: Temp:  [98.5 F (36.9 C)-100.4 F (38 C)] 98.5 F (36.9 C) (04/10 0728) Pulse Rate:  [90-102] 95 (04/10 0728) Resp:  [14-20] 20 (04/10 0728) BP: (115-138)/(64-70) 128/67 mmHg (04/10 0728) SpO2:  [94 %-98 %] 94 % (04/10 0728)  Intake/Output from previous day: 04/09 0701 - 04/10 0700 In: -  Out: 1800 [Urine:1800] Intake/Output this shift:     Recent Labs  11/30/15 1511 12/02/15 0346 12/03/15 0425  HGB 11.5* 10.4* 10.4*    Recent Labs  12/02/15 0346 12/03/15 0425  WBC 9.4 8.7  RBC 3.61* 3.60*  HCT 30.8* 31.2*  PLT 383 375    Recent Labs  12/02/15 0346 12/03/15 0425  NA 137 137  K 4.2 4.0  CL 103 104  CO2 29 30  BUN 18 20  CREATININE 0.77 0.79  GLUCOSE 169* 163*  CALCIUM 8.5* 8.7*   No results for input(s): LABPT, INR in the last 72 hours.  EXAM General - Patient is Alert, Appropriate and Oriented  Lungs- Clear to auscultation bilaterally. No wheezing rales or rhonchi. No accessory muscle use, speaks full sentences. Extremity - Neurologically intact Neurovascular intact Sensation intact distally Intact pulses distally Dorsiflexion/Plantar flexion intact  Minimal effusion. No warmth or erythema Dressing - dressing C/D/I and no drainage Motor Function - intact, moving foot and toes well on exam.   Past Medical History  Diagnosis Date  . Diabetes mellitus without complication (HCC)   . Hypertension   . Hyperlipidemia   . Anemia   . Depression     Assessment/Plan:   3 Days Post-Op Procedure(s) (LRB): ARTHROSCOPY KNEE (Right) Active Problems:   Septic arthritis of knee, right (HCC)  Estimated body mass index is 32.3 kg/(m^2)  as calculated from the following:   Height as of this encounter: 5\' 6"  (1.676 m).   Weight as of this encounter: 90.719 kg (200 lb). Advance diet Up with therapy Continue with IV abx Discharge home with IV abx pending medical clearance Per ID will need 3 wks IV rocephin 2gm daily. Will also add oral Clinda.  Patient will also need weekly CBC with diff, CMP, CRP and fax results to FAX weekly labs to [phone]  DVT Prophylaxis - Lovenox, Foot Pumps and TED hose Weight-Bearing as tolerated to right leg D/C O2 and Pulse OX and try on Room Air  T. Cranston Neighborhris Gaines, PA-C [facility] 12/03/2015, 8:03 AM

## 2015-12-03 NOTE — Care Management (Addendum)
RN assessing O2 need for home currently. Rocephin to be given prior to discharge home today. I have notified Barbara CowerJason with [facility] of patient discharge. I have paged Dr .Rosita Keamenz [facility], rolling walker Rx and HHPT order. Rolling walker requested from BrightonStephanie with [facility] to be delivered to this room prior to discharge. Patient aware. She declined bedside commode at this time.

## 2015-12-03 NOTE — Progress Notes (Signed)
[facility] - [place] at [facility]   PATIENT NAME: Patricia [name]    MR#:  [id number]  DATE OF BIRTH:  1965-11-21  SUBJECTIVE:  CHIEF COMPLAINT:  Patient is feeling fine. Cough and shortness of breath is better. Denies any hemoptysis today.   REVIEW OF SYSTEMS:  CONSTITUTIONAL: No fever, fatigue or weakness.  EYES: No blurred or double vision.  EARS, NOSE, AND THROAT: No tinnitus or ear pain.  RESPIRATORY: Productive cough, improving shortness of breath, wheezing , reporting hemoptysis.  CARDIOVASCULAR: No chest pain, orthopnea, edema.  GASTROINTESTINAL: No nausea, vomiting, diarrhea or abdominal pain.  GENITOURINARY: No dysuria, hematuria.  ENDOCRINE: No polyuria, nocturia,  HEMATOLOGY: No anemia, easy bruising or bleeding SKIN: No rash or lesion. MUSCULOSKELETAL: Right knee pain and swelling NEUROLOGIC: No tingling, numbness, weakness.  PSYCHIATRY: No anxiety or depression.   DRUG ALLERGIES:  No Known Allergies  VITALS:  Blood pressure 99/63, pulse 99, temperature 98.7 F (37.1 C), temperature source Oral, resp. rate 18, height  (1.676 m), weight 90.719 kg (200 lb), SpO2 94 %.  PHYSICAL EXAMINATION:  GENERAL:  50 y.o.-year-old patient lying in the bed with no acute distress.  EYES: Pupils equal, round, reactive to light and accommodation. No scleral icterus. Extraocular muscles intact.  HEENT: Head atraumatic, normocephalic. Oropharynx and nasopharynx clear.  NECK:  Supple, no jugular venous distention. No thyroid enlargement, no tenderness.  LUNGs: Moderate breath sounds bilaterally, no wheezing, rales,rhonchi or crepitation. No use of accessory muscles of respiration.  CARDIOVASCULAR: S1, S2 normal. No murmurs, rubs, or gallops.  ABDOMEN: Soft, nontender, nondistended. Bowel sounds present. No organomegaly or mass.  EXTREMITIES:  right knee status post  Wash. Clean dressing is present  No pedal edema, cyanosis, or clubbing.  NEUROLOGIC:  Cranial nerves II through XII are intact. Muscle strength 5/5 in all extremities. Sensation intact. Gait not checked.  PSYCHIATRIC: The patient is alert and oriented x 3.  SKIN: No obvious rash, lesion, or ulcer.    LABORATORY PANEL:   CBC  Recent Labs Lab 12/03/15 0425  WBC 8.7  HGB 10.4*  HCT 31.2*  PLT 375   ------------------------------------------------------------------------------------------------------------------  Chemistries   Recent Labs Lab 12/03/15 0425  NA 137  K 4.0  CL 104  CO2 30  GLUCOSE 163*  BUN 20  CREATININE 0.79  CALCIUM 8.7*   ------------------------------------------------------------------------------------------------------------------  Cardiac Enzymes No results for input(s): TROPONINI in the last 168 hours. ------------------------------------------------------------------------------------------------------------------  RADIOLOGY:  No results found.  EKG:   Orders placed or performed in visit on 05/12/11  . EKG 12-Lead    ASSESSMENT AND PLAN:    1. Aspiration pneumonia.  Started Unasyn , d/scontinue vancomycin and Zosyn. Repeat chest x-ray with stable pneumonia which is not getting worse. Ordered sputum culture and sensitivity but sample is not collected Discharge patient with the IV Rocephin 2 g for 3 weeks and clindamycin for total 5 days per ID recommendations  2. Right knee septic joint.- strep mitis - cx  ID is recommending unasyn and can be changed to iv rocephin 2 gm q 24 hrs with clinda for few days for anaerobic coverage D/ced  Vancomycin .  PICC line placed in the right upper extremity  Appreciate  Infectious disease consult  Dr Sampson Goon.  3. Type 2 diabetes mellitus continue. Low-dose Lantus and sliding scale. Hold Amaryl and Glucophage until I know she can tolerate a diet.  4. Essential hypertension. Continue lisinopril  5. Depression. On Prozac  Will sign off, please feel free  to contact hospitalist with  any questions or concerns  All the records are reviewed and case discussed with Care Management/Social Workerr. Management plans discussed with the patient, family and they are in agreement.  greater than 50% time was spent on face-to-face education, counseling and coordination of care  CODE STATUS: fc TOTAL TIME TAKING CARE OF THIS PATIENT: 33minutes.   Ramonita Lab.   Cyrena Kuchenbecker M.D on 12/03/2015 at 4:23 PM  Between 7am to 6pm - Pager - [phone] After 6pm go to [email or web address] - password [email or web address] [facility]  [facility]  Office  [phone]  CC: Primary care physician; Amy Diana EvesL Krebs, NP

## 2015-12-03 NOTE — Progress Notes (Signed)
Patient discharge home with Home Health, daughter provided transportation home. Discharge instruction given to patient, understanding verbalized.

## 2015-12-03 NOTE — Progress Notes (Signed)
Infectious Disease Long Term IV Antibiotic Orders  Diagnosis: Septic knee  Culture results Strep mitis, oralis  Allergies: No Known Allergies  Discharge antibiotics Ceftriaxone 2 grams every     24          hours  PICC Care per protocol Labs weekly while on IV antibiotics      CBC w diff   Comprehensive met panel CRP   Planned duration of antibiotics 3 weeks  Stop date 12/21/15  Follow up clinic date TBD  FAX weekly labs to [phone]  Leonel Ramsay, MD

## 2015-12-12 ENCOUNTER — Other Ambulatory Visit
Admission: RE | Admit: 2015-12-12 | Discharge: 2015-12-12 | Disposition: A | Discharge status: Home / Self Care | Payer: BC Managed Care – PPO | Source: Ambulatory Visit | Attending: Orthopedic Surgery | Admitting: Orthopedic Surgery

## 2015-12-12 DIAGNOSIS — M00061 Staphylococcal arthritis, right knee: Secondary | ICD-10-CM | POA: Insufficient documentation

## 2015-12-12 LAB — SYNOVIAL CELL COUNT + DIFF, W/ CRYSTALS
CRYSTALS FLUID: NONE SEEN
Eosinophils-Synovial: 0 %
Lymphocytes-Synovial Fld: 2 %
Monocyte-Macrophage-Synovial Fluid: 1 %
NEUTROPHIL, SYNOVIAL: 97 %
Other Cells-SYN: 0
WBC, SYNOVIAL: 25707 /mm3 — AB (ref 0–200)

## 2015-12-13 ENCOUNTER — Encounter
Admission: AD | Disposition: A | Discharge status: Home / Self Care | Payer: Self-pay | Source: Ambulatory Visit | Attending: Orthopedic Surgery

## 2015-12-13 ENCOUNTER — Observation Stay
Admission: AD | Admit: 2015-12-13 | Discharge: 2015-12-14 | Disposition: A | Discharge status: Home / Self Care | Payer: BC Managed Care – PPO | Source: Ambulatory Visit | Attending: Orthopedic Surgery | Admitting: Orthopedic Surgery

## 2015-12-13 ENCOUNTER — Ambulatory Visit: Payer: BC Managed Care – PPO | Admitting: Anesthesiology

## 2015-12-13 ENCOUNTER — Encounter: Payer: Self-pay | Admitting: *Deleted

## 2015-12-13 DIAGNOSIS — Z9889 Other specified postprocedural states: Secondary | ICD-10-CM | POA: Diagnosis not present

## 2015-12-13 DIAGNOSIS — Z7982 Long term (current) use of aspirin: Secondary | ICD-10-CM | POA: Diagnosis not present

## 2015-12-13 DIAGNOSIS — Z8614 Personal history of Methicillin resistant Staphylococcus aureus infection: Secondary | ICD-10-CM | POA: Insufficient documentation

## 2015-12-13 DIAGNOSIS — H8110 Benign paroxysmal vertigo, unspecified ear: Secondary | ICD-10-CM | POA: Diagnosis not present

## 2015-12-13 DIAGNOSIS — M00061 Staphylococcal arthritis, right knee: Principal | ICD-10-CM | POA: Insufficient documentation

## 2015-12-13 DIAGNOSIS — Z79899 Other long term (current) drug therapy: Secondary | ICD-10-CM | POA: Insufficient documentation

## 2015-12-13 DIAGNOSIS — E1142 Type 2 diabetes mellitus with diabetic polyneuropathy: Secondary | ICD-10-CM | POA: Diagnosis not present

## 2015-12-13 DIAGNOSIS — I10 Essential (primary) hypertension: Secondary | ICD-10-CM | POA: Diagnosis not present

## 2015-12-13 DIAGNOSIS — Z791 Long term (current) use of non-steroidal anti-inflammatories (NSAID): Secondary | ICD-10-CM | POA: Insufficient documentation

## 2015-12-13 DIAGNOSIS — F329 Major depressive disorder, single episode, unspecified: Secondary | ICD-10-CM | POA: Insufficient documentation

## 2015-12-13 DIAGNOSIS — Z79891 Long term (current) use of opiate analgesic: Secondary | ICD-10-CM | POA: Insufficient documentation

## 2015-12-13 DIAGNOSIS — Z452 Encounter for adjustment and management of vascular access device: Secondary | ICD-10-CM | POA: Insufficient documentation

## 2015-12-13 DIAGNOSIS — IMO0002 Reserved for concepts with insufficient information to code with codable children: Secondary | ICD-10-CM

## 2015-12-13 DIAGNOSIS — M659 Synovitis and tenosynovitis, unspecified: Secondary | ICD-10-CM | POA: Diagnosis not present

## 2015-12-13 DIAGNOSIS — G4700 Insomnia, unspecified: Secondary | ICD-10-CM | POA: Insufficient documentation

## 2015-12-13 DIAGNOSIS — E785 Hyperlipidemia, unspecified: Secondary | ICD-10-CM | POA: Diagnosis not present

## 2015-12-13 DIAGNOSIS — J189 Pneumonia, unspecified organism: Secondary | ICD-10-CM | POA: Diagnosis not present

## 2015-12-13 DIAGNOSIS — E1165 Type 2 diabetes mellitus with hyperglycemia: Secondary | ICD-10-CM

## 2015-12-13 DIAGNOSIS — T8859XA Other complications of anesthesia, initial encounter: Secondary | ICD-10-CM

## 2015-12-13 DIAGNOSIS — Z794 Long term (current) use of insulin: Secondary | ICD-10-CM | POA: Insufficient documentation

## 2015-12-13 DIAGNOSIS — K219 Gastro-esophageal reflux disease without esophagitis: Secondary | ICD-10-CM | POA: Diagnosis not present

## 2015-12-13 DIAGNOSIS — Z9049 Acquired absence of other specified parts of digestive tract: Secondary | ICD-10-CM | POA: Insufficient documentation

## 2015-12-13 DIAGNOSIS — M009 Pyogenic arthritis, unspecified: Secondary | ICD-10-CM | POA: Diagnosis present

## 2015-12-13 HISTORY — PX: KNEE ARTHROSCOPY: SHX127

## 2015-12-13 HISTORY — DX: Adverse effect of unspecified anesthetic, initial encounter: T41.45XA

## 2015-12-13 HISTORY — DX: Other complications of anesthesia, initial encounter: T88.59XA

## 2015-12-13 LAB — GLUCOSE, CAPILLARY
GLUCOSE-CAPILLARY: 124 mg/dL — AB (ref 65–99)
GLUCOSE-CAPILLARY: 125 mg/dL — AB (ref 65–99)
Glucose-Capillary: 113 mg/dL — ABNORMAL HIGH (ref 65–99)

## 2015-12-13 SURGERY — ARTHROSCOPY, KNEE
Anesthesia: General | Site: Knee | Laterality: Right | Wound class: Contaminated

## 2015-12-13 MED ORDER — INSULIN ASPART 100 UNIT/ML ~~LOC~~ SOLN
0.0000 [IU] | Freq: Three times a day (TID) | SUBCUTANEOUS | Status: DC
  Administered 2015-12-14: 2 [IU] via SUBCUTANEOUS
  Filled 2015-12-13: qty 2

## 2015-12-13 MED ORDER — FENTANYL CITRATE (PF) 100 MCG/2ML IJ SOLN
25.0000 ug | INTRAMUSCULAR | Status: DC | PRN
  Administered 2015-12-13 (×4): 25 ug via INTRAVENOUS

## 2015-12-13 MED ORDER — SODIUM CHLORIDE 0.9 % IV SOLN
INTRAVENOUS | Status: DC
  Administered 2015-12-13: 17:00:00 via INTRAVENOUS

## 2015-12-13 MED ORDER — FENTANYL CITRATE (PF) 100 MCG/2ML IJ SOLN
INTRAMUSCULAR | Status: DC | PRN
  Administered 2015-12-13: 100 ug via INTRAVENOUS

## 2015-12-13 MED ORDER — SUCCINYLCHOLINE CHLORIDE 20 MG/ML IJ SOLN
INTRAMUSCULAR | Status: DC | PRN
  Administered 2015-12-13: 100 mg via INTRAVENOUS

## 2015-12-13 MED ORDER — ONDANSETRON HCL 4 MG/2ML IJ SOLN
INTRAMUSCULAR | Status: DC | PRN
  Administered 2015-12-13: 4 mg via INTRAVENOUS

## 2015-12-13 MED ORDER — METHOCARBAMOL 500 MG PO TABS: 500.0000 mg | ORAL_TABLET | Freq: Four times a day (QID) | ORAL | Status: DC | PRN

## 2015-12-13 MED ORDER — MIDAZOLAM HCL 2 MG/2ML IJ SOLN
INTRAMUSCULAR | Status: DC | PRN
  Administered 2015-12-13: 2 mg via INTRAVENOUS

## 2015-12-13 MED ORDER — FENTANYL CITRATE (PF) 100 MCG/2ML IJ SOLN
INTRAMUSCULAR | Status: AC
  Administered 2015-12-13: 25 ug via INTRAVENOUS
  Filled 2015-12-13: qty 2

## 2015-12-13 MED ORDER — DOCUSATE SODIUM 100 MG PO CAPS
100.0000 mg | ORAL_CAPSULE | Freq: Two times a day (BID) | ORAL | Status: DC
  Administered 2015-12-13 – 2015-12-14 (×2): 100 mg via ORAL
  Filled 2015-12-13 (×2): qty 1

## 2015-12-13 MED ORDER — INSULIN GLARGINE 300 UNIT/ML ~~LOC~~ SOPN: 40.0000 [IU] | PEN_INJECTOR | Freq: Every day | SUBCUTANEOUS | Status: DC

## 2015-12-13 MED ORDER — METOCLOPRAMIDE HCL 5 MG/ML IJ SOLN: 5.0000 mg | Freq: Three times a day (TID) | INTRAMUSCULAR | Status: DC | PRN

## 2015-12-13 MED ORDER — ONDANSETRON HCL 4 MG/2ML IJ SOLN
INTRAMUSCULAR | Status: AC
Start: 2015-12-13 — End: 2015-12-13
  Administered 2015-12-13: 4 mg via INTRAVENOUS
  Filled 2015-12-13: qty 2

## 2015-12-13 MED ORDER — SODIUM CHLORIDE 0.9 % IV SOLN
INTRAVENOUS | Status: DC
  Administered 2015-12-13: 75 mL/h via INTRAVENOUS

## 2015-12-13 MED ORDER — ONDANSETRON HCL 4 MG/2ML IJ SOLN
4.0000 mg | Freq: Once | INTRAMUSCULAR | Status: AC | PRN
  Administered 2015-12-13: 4 mg via INTRAVENOUS

## 2015-12-13 MED ORDER — PHENYLEPHRINE HCL 10 MG/ML IJ SOLN
INTRAMUSCULAR | Status: DC | PRN
  Administered 2015-12-13 (×3): 100 ug via INTRAVENOUS

## 2015-12-13 MED ORDER — ONDANSETRON HCL 4 MG/2ML IJ SOLN: 4.0000 mg | Freq: Four times a day (QID) | INTRAMUSCULAR | Status: DC | PRN

## 2015-12-13 MED ORDER — METOPROLOL TARTRATE 25 MG PO TABS
25.0000 mg | ORAL_TABLET | Freq: Once | ORAL | Status: AC
  Administered 2015-12-13: 25 mg via ORAL

## 2015-12-13 MED ORDER — SODIUM CHLORIDE FLUSH 0.9 % IV SOLN
INTRAVENOUS | Status: AC
  Filled 2015-12-13: qty 10

## 2015-12-13 MED ORDER — CEFAZOLIN SODIUM-DEXTROSE 2-4 GM/100ML-% IV SOLN
INTRAVENOUS | Status: AC
  Filled 2015-12-13: qty 100

## 2015-12-13 MED ORDER — BUPIVACAINE-EPINEPHRINE (PF) 0.5% -1:200000 IJ SOLN
INTRAMUSCULAR | Status: AC
  Filled 2015-12-13: qty 30

## 2015-12-13 MED ORDER — ONDANSETRON HCL 4 MG PO TABS: 4.0000 mg | ORAL_TABLET | Freq: Four times a day (QID) | ORAL | Status: DC | PRN

## 2015-12-13 MED ORDER — MAGNESIUM HYDROXIDE 400 MG/5ML PO SUSP: 30.0000 mL | Freq: Every day | ORAL | Status: DC | PRN

## 2015-12-13 MED ORDER — DEXTROSE 5 % IV SOLN
2.0000 g | Freq: Once | INTRAVENOUS | Status: AC
  Administered 2015-12-13: 2 g via INTRAVENOUS
  Filled 2015-12-13: qty 2

## 2015-12-13 MED ORDER — PROPOFOL 10 MG/ML IV BOLUS
INTRAVENOUS | Status: DC | PRN
  Administered 2015-12-13: 200 mg via INTRAVENOUS

## 2015-12-13 MED ORDER — CEFAZOLIN SODIUM-DEXTROSE 2-4 GM/100ML-% IV SOLN: 2.0000 g | Freq: Once | INTRAVENOUS | Status: DC

## 2015-12-13 MED ORDER — METOPROLOL TARTRATE 25 MG PO TABS
ORAL_TABLET | ORAL | Status: AC
  Administered 2015-12-13: 25 mg via ORAL
  Filled 2015-12-13: qty 1

## 2015-12-13 MED ORDER — METOCLOPRAMIDE HCL 10 MG PO TABS
5.0000 mg | ORAL_TABLET | Freq: Three times a day (TID) | ORAL | Status: DC | PRN
Start: 2015-12-13 — End: 2015-12-14

## 2015-12-13 MED ORDER — HYDROCODONE-ACETAMINOPHEN 5-325 MG PO TABS
1.0000 | ORAL_TABLET | ORAL | Status: DC | PRN
  Administered 2015-12-13 (×2): 1 via ORAL
  Administered 2015-12-14 (×3): 2 via ORAL
  Filled 2015-12-13 (×2): qty 2
  Filled 2015-12-13: qty 1
  Filled 2015-12-13: qty 2
  Filled 2015-12-13: qty 1

## 2015-12-13 MED ORDER — BUPIVACAINE-EPINEPHRINE (PF) 0.5% -1:200000 IJ SOLN
INTRAMUSCULAR | Status: DC | PRN
  Administered 2015-12-13: 20 mL

## 2015-12-13 MED ORDER — LIDOCAINE HCL (CARDIAC) 20 MG/ML IV SOLN
INTRAVENOUS | Status: DC | PRN
  Administered 2015-12-13: 100 mg via INTRAVENOUS

## 2015-12-13 MED ORDER — MORPHINE SULFATE (PF) 2 MG/ML IV SOLN
2.0000 mg | INTRAVENOUS | Status: DC | PRN
  Administered 2015-12-13 – 2015-12-14 (×2): 2 mg via INTRAVENOUS
  Filled 2015-12-13 (×2): qty 1

## 2015-12-13 MED ORDER — METHOCARBAMOL 1000 MG/10ML IJ SOLN: 500.0000 mg | Freq: Four times a day (QID) | INTRAVENOUS | Status: DC | PRN

## 2015-12-13 MED ORDER — FAMOTIDINE 20 MG PO TABS
ORAL_TABLET | ORAL | Status: AC
  Administered 2015-12-13: 20 mg
  Filled 2015-12-13: qty 1

## 2015-12-13 SURGICAL SUPPLY — 25 items
BANDAGE ACE 4X5 VEL STRL LF (GAUZE/BANDAGES/DRESSINGS) ×3 IMPLANT
BANDAGE ELASTIC 4 LF NS (GAUZE/BANDAGES/DRESSINGS) ×3 IMPLANT
BLADE FULL RADIUS 3.5 (BLADE) IMPLANT
BLADE INCISOR PLUS 4.5 (BLADE) IMPLANT
BLADE SHAVER 4.5 DBL SERAT CV (CUTTER) IMPLANT
BLADE SHAVER 4.5X7 STR FR (MISCELLANEOUS) IMPLANT
CHLORAPREP W/TINT 26ML (MISCELLANEOUS) ×3 IMPLANT
CUTTER AGGRESSIVE+ 3.5 (CUTTER) ×3 IMPLANT
GAUZE SPONGE 4X4 12PLY STRL (GAUZE/BANDAGES/DRESSINGS) ×3 IMPLANT
GLOVE SURG ORTHO 9.0 STRL STRW (GLOVE) ×3 IMPLANT
GOWN STRL REUS W/ TWL LRG LVL3 (GOWN DISPOSABLE) ×1 IMPLANT
GOWN STRL REUS W/TWL LRG LVL3 (GOWN DISPOSABLE) ×2
GOWN SURG XXL (GOWNS) ×3 IMPLANT
IV LACTATED RINGER IRRG 3000ML (IV SOLUTION) ×8
IV LR IRRIG 3000ML ARTHROMATIC (IV SOLUTION) ×4 IMPLANT
KIT RM TURNOVER STRD PROC AR (KITS) ×3 IMPLANT
MANIFOLD NEPTUNE II (INSTRUMENTS) ×3 IMPLANT
PACK ARTHROSCOPY KNEE (MISCELLANEOUS) ×3 IMPLANT
SET TUBE SUCT SHAVER OUTFL 24K (TUBING) ×3 IMPLANT
SET TUBE TIP INTRA-ARTICULAR (MISCELLANEOUS) ×3 IMPLANT
SUT ETHILON 4-0 (SUTURE) ×2
SUT ETHILON 4-0 FS2 18XMFL BLK (SUTURE) ×1
SUTURE ETHLN 4-0 FS2 18XMF BLK (SUTURE) ×1 IMPLANT
TUBING ARTHRO INFLOW-ONLY STRL (TUBING) ×3 IMPLANT
WAND HAND CNTRL MULTIVAC 50 (MISCELLANEOUS) ×3 IMPLANT

## 2015-12-13 NOTE — OR Nursing (Signed)
PIC line in right antec. Accessed using sterile technique with good blood return obtained. IV ns infusing.

## 2015-12-13 NOTE — Transfer of Care (Signed)
Immediate Anesthesia Transfer of Care Note  Patient: Patricia [name]  Procedure(s) Performed: Procedure(s): ARTHROSCOPY KNEE AND DEBRIDEMENT (Right)  Patient Location: PACU  Anesthesia Type:General  Level of Consciousness: awake and alert   Airway & Oxygen Therapy: Patient Spontanous Breathing and Patient connected to face mask oxygen  Post-op Assessment: Report given to RN  Post vital signs: Reviewed  Last Vitals:  Filed Vitals:   12/13/15 1604 12/13/15 1800  BP: 138/83 171/98  Pulse: 102 61  Temp: 37.6 C 37.3 C  Resp: 18 21    Complications: No apparent anesthesia complications

## 2015-12-13 NOTE — Anesthesia Procedure Notes (Signed)
Procedure Name: Intubation Date/Time: 12/13/2015 5:11 PM Performed by: Mathews ArgyleLOGAN, Patricia [name] Pre-anesthesia Checklist: Patient identified, Emergency Drugs available, Suction available, Patient being monitored and Timeout performed Patient Re-evaluated:Patient Re-evaluated prior to inductionOxygen Delivery Method: Circle system utilized Preoxygenation: Pre-oxygenation with 100% oxygen Intubation Type: IV induction, Rapid sequence and Cricoid Pressure applied Laryngoscope Size: Mac and 3 Grade View: Grade I Tube type: Oral Tube size: 7.0 mm Number of attempts: 1 Airway Equipment and Method: Stylet Placement Confirmation: ETT inserted through vocal cords under direct vision,  positive ETCO2 and breath sounds checked- equal and bilateral Secured at: 20 cm Tube secured with: Tape Dental Injury: Teeth and Oropharynx as per pre-operative assessment  Comments: ETT placed by Michaele OfferKasey Savage CRNA

## 2015-12-13 NOTE — H&P (Signed)
Reviewed paper H+P, will be scanned into chart. No changes noted.  

## 2015-12-13 NOTE — Progress Notes (Signed)
New Admission Note:   Arrival Method: per bed post op from PACU  Mental Orientation: alert and oriented X4 Telemetry: none ordered Assessment: Completed Skin: warm, dry, with surgical incision on the right knee dressed with ace wrap, ice pack applied as ordered IV: PICC line on the right upper arm with gauze dressing clean and dry Pain: scale 9/10, on the surgical site, will give PRN pain medicine  Safety Measures: Safety Fall Prevention Plan has been given and discussed Admission: Completed [facility] [place] Orientation: Patient has been orientated to the room, unit and staff.  Family: daughter at bedside  Orders have been reviewed and implemented. Will continue to monitor the patient. Call light has been placed within reach and bed alarm has been activated.   Janice NorrieAnessa Shrey Boike BSN, RN [facility]

## 2015-12-13 NOTE — Anesthesia Preprocedure Evaluation (Signed)
Anesthesia Evaluation  Patient identified by MRN, date of birth, ID band Patient awake    Reviewed: Allergy & Precautions, NPO status , Patient's Chart, lab work & pertinent test results  History of Anesthesia Complications Negative for: history of anesthetic complications  Airway Mallampati: III       Dental  (+) Teeth Intact   Pulmonary neg pulmonary ROS,           Cardiovascular hypertension, Pt. on medications and Pt. on home beta blockers      Neuro/Psych  Neuromuscular disease (diabetic peripheral neuropathy)    GI/Hepatic negative GI ROS, Neg liver ROS,   Endo/Other  diabetes, Type 2, Oral Hypoglycemic Agents  Renal/GU negative Renal ROS     Musculoskeletal   Abdominal   Peds  Hematology   Anesthesia Other Findings Past Medical History:   Diabetes mellitus without complication (HCC)                 Hypertension                                                 Hyperlipidemia                                               Anemia                                                       Depression                                                   Complication of anesthesia                                     Comment:aspiration after breathing tube removed after               several surgeries   Reproductive/Obstetrics                             Anesthesia Physical  Anesthesia Plan  ASA: III  Anesthesia Plan: General   Post-op Pain Management:    Induction: Intravenous  Airway Management Planned: Nasal Cannula  Additional Equipment:   Intra-op Plan:   Post-operative Plan:   Informed Consent: I have reviewed the patients History and Physical, chart, labs and discussed the procedure including the risks, benefits and alternatives for the proposed anesthesia with the patient or authorized representative who has indicated his/her understanding and acceptance.     Plan  Discussed with: Anesthesiologist, CRNA and Surgeon  Anesthesia Plan Comments:         Anesthesia Quick Evaluation

## 2015-12-13 NOTE — Op Note (Signed)
12/13/2015  5:58 PM  PATIENT:  Patricia AskewPhyllis J [name]  50 y.o. female  PRE-OPERATIVE DIAGNOSIS:  septic knee right  POST-OPERATIVE DIAGNOSIS:  septic knee   PROCEDURE:  Procedure(s): ARTHROSCOPY KNEE AND DEBRIDEMENT (Right)  SURGEON: Leitha SchullerMichael J Murline Weigel, MD  ASSISTANTS: None  ANESTHESIA:   general  EBL:  Total I/O In: 500 [I.V.:500] Out: -   BLOOD ADMINISTERED:none  DRAINS: none   LOCAL MEDICATIONS USED:  MARCAINE     SPECIMEN:  No Specimen  DISPOSITION OF SPECIMEN:  N/A  COUNTS:  YES  TOURNIQUET:    IMPLANTS: None  DICTATION: .Dragon Dictation patient was brought to the operating room and after adequate general anesthesia was obtained, the right leg was prepped and draped in sterile fashion. After patient identification and timeout procedure were completed an inferior lateral portal was used to get into the knee using the prior incision. Initial inspection revealed cloudy fluid which was had already been sent for analysis in the office yesterday and so additional specimen was not sent. An inferior medial portal was made using the prior portal and a shaver inserted. Inspection knee revealed revealed exposed bone in the femoral trochlea and patella severe medial compartment arthritis consistent with prior findings and moderate lateral compartment arthritis anterior cruciate ligament intact there was some synovitis present in the suprapatellar pouch which was debrided with use of a shaver and 12 L of fluid was irrigated through the knee to try to address the infection. After completion of the thorough irrigation and debridement of synovium, instrumentation was withdrawn and 20 cc half percent Sensorcaine infiltrated the portals. The portals were left open to allow for postop drainage if needed. A sterile dressing of Xeroform 4 x 4 web roll and Ace wrap applied.  PLAN OF CARE: Admit for overnight observation  PATIENT DISPOSITION:  PACU - hemodynamically stable.

## 2015-12-14 ENCOUNTER — Encounter: Payer: Self-pay | Admitting: Orthopedic Surgery

## 2015-12-14 DIAGNOSIS — M00061 Staphylococcal arthritis, right knee: Secondary | ICD-10-CM | POA: Diagnosis not present

## 2015-12-14 LAB — GLUCOSE, CAPILLARY
GLUCOSE-CAPILLARY: 105 mg/dL — AB (ref 65–99)
Glucose-Capillary: 124 mg/dL — ABNORMAL HIGH (ref 65–99)

## 2015-12-14 MED ORDER — HYDROCODONE-ACETAMINOPHEN 5-325 MG PO TABS: 1.0000 | ORAL_TABLET | ORAL | Status: DC | PRN

## 2015-12-14 NOTE — Progress Notes (Signed)
   Subjective: 1 Day Post-Op Procedure(s) (LRB): ARTHROSCOPY KNEE AND DEBRIDEMENT (Right) Patient reports pain as 7 on 0-10 scale.   Patient is well, and has had no acute complaints or problems Denies any CP, SOB, ABD pain. We will continue therapy today.  Plan is to go Home after hospital stay.  Objective: Vital signs in last 24 hours: Temp:  [98.1 F (36.7 C)-100.7 F (38.2 C)] 98.1 F (36.7 C) (04/21 0745) Pulse Rate:  [61-102] 84 (04/21 0745) Resp:  [12-21] 18 (04/21 0745) BP: (123-171)/(52-98) 133/60 mmHg (04/21 0745) SpO2:  [93 %-100 %] 97 % (04/21 0745)  Intake/Output from previous day: 04/20 0701 - 04/21 0700 In: 1688.8 [P.O.:340; I.V.:1348.8] Out: -  Intake/Output this shift:    No results for input(s): HGB in the last 72 hours. No results for input(s): WBC, RBC, HCT, PLT in the last 72 hours. No results for input(s): NA, K, CL, CO2, BUN, CREATININE, GLUCOSE, CALCIUM in the last 72 hours. No results for input(s): LABPT, INR in the last 72 hours.  EXAM General - Patient is Alert, Appropriate and Oriented Extremity - Neurovascular intact Sensation intact distally Intact pulses distally Dorsiflexion/Plantar flexion intact Dressing - dressing C/D/I and no drainage Motor Function - intact, moving foot and toes well on exam.   Past Medical History  Diagnosis Date  . Diabetes mellitus without complication (HCC)   . Hypertension   . Hyperlipidemia   . Anemia   . Depression   . Complication of anesthesia     aspiration after breathing tube removed after several surgeries    Assessment/Plan:   1 Day Post-Op Procedure(s) (LRB): ARTHROSCOPY KNEE AND DEBRIDEMENT (Right) Active Problems:   Septic joint of right knee joint (HCC)  Estimated body mass index is 32.30 kg/(m^2) as calculated from the following:   Height as of this encounter: 5\' 6"  (1.676 m).   Weight as of 11/30/15: 90.719 kg (200 lb). Advance diet Up with therapy Discharge home today. Continue  with IV abx Follow up with [facility] in 3 days  Weight-Bearing as tolerated to right leg D/C O2 and Pulse OX and try on Room Air  T. Cranston Neighborhris Gaines, PA-C [facility] 12/14/2015, 8:18 AM

## 2015-12-14 NOTE — Care Management (Signed)
LIves with husband and daughter. Uses walker to ambulate. She sees [facility]. Her daughter will provide transportation at 1PM. [facility]. No trouble obtaining Rx. I have notified [facility] of patient discharge. Case closed.

## 2015-12-14 NOTE — Discharge Instructions (Signed)
Diet: As you were doing prior to hospitalization   Shower:  Keep dressing clean and dry.  Dressing:  You may change your dressing as needed. Change the dressing with sterile gauze dressing.    Activity:  Increase activity slowly as tolerated, but follow the weight bearing instructions below.  No lifting or driving for 6 weeks.  Weight Bearing:   Weight bearing as tolerated to right lower extremity  To prevent constipation: you may use a stool softener such as -  Colace (over the counter) 100 mg by mouth twice a day  Drink plenty of fluids (prune juice may be helpful) and high fiber foods Miralax (over the counter) for constipation as needed.    Itching:  If you experience itching with your medications, try taking only a single pain pill, or even half a pain pill at a time.  You may take up to 10 pain pills per day, and you can also use benadryl over the counter for itching or also to help with sleep.   Precautions:  If you experience chest pain or shortness of breath - call 911 immediately for transfer to the hospital emergency department!!  If you develop a fever greater that 101 F, purulent drainage from wound, increased redness or drainage from wound, or calf pain-Call [facility]                                              Follow- Up Appointment:  Please call for an appointment to [facility]

## 2015-12-14 NOTE — Discharge Summary (Signed)
Physician Discharge Summary  Patient ID: Patricia [name] MRN: [id number] DOB/AGE: [birth year] 51 y.o.  Admit date: 12/13/2015 Discharge date: 12/14/2015  Admission Diagnoses:  septic knee    Discharge Diagnoses: Patient Active Problem List   Diagnosis Date Noted  . Septic joint of right knee joint ([place]) 12/13/2015  . Septic arthritis of knee, right ([place]) 11/30/2015  . Hypertension 09/05/2015  . BPPV (benign paroxysmal positional vertigo) 09/05/2015  . Insomnia 09/05/2015  . 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 polyneuropathy, without long-term current use of insulin ([place]) 09/05/2015    Past Medical History  Diagnosis Date  . Diabetes mellitus without complication ([place])   . Hypertension   . Hyperlipidemia   . Anemia   . Depression   . Complication of anesthesia     aspiration after breathing tube removed after several surgeries     Transfusion: none   Consultants (if any):    Discharged Condition: Improved  Hospital Course: Patricia [name] is an 50 y.o. female who was admitted 12/13/2015 with a diagnosis of septic joint and went to the operating room on 12/13/2015 and underwent the above named procedures.    Surgeries: Procedure(s): ARTHROSCOPY KNEE AND DEBRIDEMENT on 12/13/2015 Patient tolerated the surgery well. Taken to PACU where she was stabilized and then transferred to the orthopedic floor.  Foot pumps applied bilaterally at 80 mm. Heels elevated on bed with rolled towels. No evidence of DVT. Negative [place]. Physical therapy started on day #1 for gait training and transfer. OT started day #1 for ADL and assisted devices.  Patient's foley was d/c on day #1. Patient's IV and hemovac was d/c on day #1.  On post op day #1 patient was stable and ready for discharge to home.   She was given perioperative antibiotics:  Anti-infectives    Start     Dose/Rate Route Frequency Ordered Stop   12/13/15 1600  cefTRIAXone (ROCEPHIN) 2 g in dextrose 5 % 50 mL  IVPB     2 g 100 mL/hr over 30 Minutes Intravenous  Once 12/13/15 1550 12/13/15 1658   12/13/15 1555  ceFAZolin (ANCEF) 2-4 GM/100ML-% IVPB    Comments:  Slemenda, Debbie: cabinet override      12/13/15 1555 12/14/15 0359   12/13/15 1545  ceFAZolin (ANCEF) IVPB 2g/100 mL premix  Status:  Discontinued     2 g 200 mL/hr over 30 Minutes Intravenous  Once 12/13/15 1542 12/13/15 1906    .  She was given sequential compression devices, early ambulation, and aspirin for DVT prophylaxis.  She benefited maximally from the hospital stay and there were no complications.    Recent vital signs:  Filed Vitals:   12/14/15 0412 12/14/15 0745  BP: 123/60 133/60  Pulse: 85 84  Temp: 98.4 F (36.9 C) 98.1 F (36.7 C)  Resp: 18 18    Recent laboratory studies:  Lab Results  Component Value Date   HGB 10.4* 12/03/2015   HGB 10.4* 12/02/2015   HGB 11.5* 11/30/2015   Lab Results  Component Value Date   WBC 8.7 12/03/2015   PLT 375 12/03/2015   No results found for: INR Lab Results  Component Value Date   NA 137 12/03/2015   K 4.0 12/03/2015   CL 104 12/03/2015   CO2 30 12/03/2015   BUN 20 12/03/2015   CREATININE 0.79 12/03/2015   GLUCOSE 163* 12/03/2015    Discharge Medications:     Medication List    TAKE these medications  aspirin EC 325 MG tablet  Take 1 tablet (325 mg total) by mouth daily.     blood glucose meter kit and supplies Kit  Dispense based on patient and insurance preference. Use up to four times daily as directed. (FOR ICD-9 250.00, 250.01).     cefTRIAXone 2 g in dextrose 5 % 50 mL  Inject 2 g into the vein daily. X 21 days     clindamycin 300 MG capsule  Commonly known as:  CLEOCIN  Take 1 capsule (300 mg total) by mouth 4 (four) times daily. X 5 days     FLUoxetine 40 MG capsule  Commonly known as:  PROZAC  Take 1 capsule (40 mg total) by mouth daily.     furosemide 20 MG tablet  Commonly known as:  LASIX  Take 0.5 tablets (10 mg total) by  mouth daily. For 1 week.     glimepiride 4 MG tablet  Commonly known as:  AMARYL  Take 2 tablets (8 mg total) by mouth daily with breakfast.     HYDROcodone-acetaminophen 5-325 MG tablet  Commonly known as:  NORCO/VICODIN  Take 1-2 tablets by mouth every 4 (four) hours as needed for moderate pain.     HYDROcodone-acetaminophen 5-325 MG tablet  Commonly known as:  NORCO/VICODIN  Take 1-2 tablets by mouth every 4 (four) hours as needed for moderate pain.     Insulin Glargine 300 UNIT/ML Sopn  Commonly known as:  TOUJEO SOLOSTAR  Inject 40 Units into the skin at bedtime.     Insulin Pen Needle 29G X 12.7MM Misc  1 each by Does not apply route daily.     lisinopril 20 MG tablet  Commonly known as:  PRINIVIL,ZESTRIL  Take 1 tablet (20 mg total) by mouth daily.     meclizine 32 MG tablet  Commonly known as:  ANTIVERT  Take 1 tablet (32 mg total) by mouth 3 (three) times daily as needed.     Melatonin 3 MG Caps  Take 2 capsules (6 mg total) by mouth daily.     metformin 1000 MG (OSM) 24 hr tablet  Commonly known as:  FORTAMET  Take 1 tablet (1,000 mg total) by mouth 2 (two) times daily.     metoprolol tartrate 25 MG tablet  Commonly known as:  LOPRESSOR  Take 1 tablet (25 mg total) by mouth 2 (two) times daily.     ONE TOUCH ULTRA TEST test strip  Generic drug:  glucose blood  CHECK BLOOD GLUCOSE UP TO FOUR TIMES DAILY AS DIRECTED     ONETOUCH DELICA LANCETS 73A Misc     oxyCODONE 5 MG immediate release tablet  Commonly known as:  Oxy IR/ROXICODONE  Take 1-2 tablets (5-10 mg total) by mouth every 3 (three) hours as needed for breakthrough pain.     sodium chloride flush 0.9 % Soln  Commonly known as:  NS  10-40 mLs by Intracatheter route as needed (flush).        Diagnostic Studies: Dg Chest 1 View  12/01/2015  CLINICAL DATA:  50 year old female, status post PICC EXAM: CHEST 1 VIEW COMPARISON:  12/01/2015 FINDINGS: Interval placement of right upper extremity PICC,  terminating at the superior cavoatrial junction. Similar appearance of improving left upper lobe airspace disease. Unchanged appearance of cardiomediastinal silhouette. IMPRESSION: Interval placement right upper extremity PICC, appearing to terminate superior cavoatrial junction. Similar appearance of left upper airspace disease. Signed, Dulcy Fanny. Earleen Newport, DO [facility] Electronically Signed  By: Corrie Mckusick D.O.   On: 12/01/2015 11:43   Dg Chest 1 View  11/30/2015  CLINICAL DATA:  Postop from the surgery.  Questionable aspiration. EXAM: CHEST 1 VIEW COMPARISON:  03/08/2007 FINDINGS: There is new airspace opacity that projects in the left upper lobe extending from the hilum. This is consistent with aspiration pneumonitis. Remainder of the lungs is clear. Heart, mediastinum and hila are unremarkable. No pleural effusion or pneumothorax. IMPRESSION: Left upper lobe airspace opacity consistent with aspiration pneumonitis. Electronically Signed   By: Lajean Manes M.D.   On: 11/30/2015 17:38   Dg Chest 2 View  12/01/2015  CLINICAL DATA:  One day post right knee arthroscopic surgery. Follow-up presumed aspiration pneumonia in the left upper lobe. Productive cough. EXAM: CHEST  2 VIEW COMPARISON:  11/30/2015 and earlier. FINDINGS: Patchy airspace opacities in the left upper lobe, with improved aeration since yesterday. Stable mild chronic elevation of the left hemidiaphragm with chronic scar/atelectasis in the left lower lobe. No new pulmonary parenchymal abnormalities. Cardiomediastinal silhouette unremarkable, unchanged. Degenerative changes involving the thoracic spine. IMPRESSION: Left upper lobe pneumonia, with improved aeration since yesterday. No new abnormalities. Electronically Signed   By: Evangeline Dakin M.D.   On: 12/01/2015 11:11    Disposition: 06-Home-Health Care Svc        Follow-up Information    Follow up with MENZ,MICHAEL, MD In  3 days.   Specialty:  Orthopedic Surgery   Why:  For wound re-check   Contact information:   [place] [address] [phone]        Signed: Feliberto Gottron 12/14/2015, 8:21 AM

## 2015-12-14 NOTE — Anesthesia Postprocedure Evaluation (Signed)
Anesthesia Post Note  Patient: Patricia [name]  Procedure(s) Performed: Procedure(s) (LRB): ARTHROSCOPY KNEE AND DEBRIDEMENT (Right)  Patient location during evaluation: PACU Anesthesia Type: General Level of consciousness: awake and alert Pain management: pain level controlled Vital Signs Assessment: post-procedure vital signs reviewed and stable Respiratory status: spontaneous breathing, nonlabored ventilation, respiratory function stable and patient connected to nasal cannula oxygen Cardiovascular status: blood pressure returned to baseline and stable Postop Assessment: no signs of nausea or vomiting Anesthetic complications: no    Last Vitals:  Filed Vitals:   12/13/15 2335 12/14/15 0412  BP:  123/60  Pulse:  85  Temp: 37.2 C 36.9 C  Resp:  18    Last Pain:  Filed Vitals:   12/14/15 0415  PainSc: 3                  Lenard SimmerAndrew Nazanin Kinner

## 2015-12-14 NOTE — Progress Notes (Signed)
Patient expressed to me that she concerned that Dr. Sampson GoonFitzgerald did not know that she was back hospitalized. I contact Dr. Rosita KeaMenz to see if he had notified Dr. Sampson GoonFitzgerald. Dr. Rosita KeaMenz is going to call Dr. Sampson GoonFitzgerald himself to let him know that he scoped the patient and she is in the hospital again.

## 2015-12-18 ENCOUNTER — Other Ambulatory Visit: Payer: Self-pay | Admitting: Family Medicine

## 2016-01-14 ENCOUNTER — Other Ambulatory Visit: Payer: Self-pay | Admitting: Orthopedic Surgery

## 2016-01-14 ENCOUNTER — Ambulatory Visit
Admission: RE | Admit: 2016-01-14 | Discharge: 2016-01-14 | Disposition: A | Discharge status: Home / Self Care | Payer: BC Managed Care – PPO | Source: Ambulatory Visit | Attending: Orthopedic Surgery | Admitting: Orthopedic Surgery

## 2016-01-14 DIAGNOSIS — M00061 Staphylococcal arthritis, right knee: Secondary | ICD-10-CM | POA: Insufficient documentation

## 2016-01-14 DIAGNOSIS — M25561 Pain in right knee: Secondary | ICD-10-CM

## 2016-01-14 DIAGNOSIS — M25461 Effusion, right knee: Secondary | ICD-10-CM | POA: Insufficient documentation

## 2016-01-25 ENCOUNTER — Ambulatory Visit (INDEPENDENT_AMBULATORY_CARE_PROVIDER_SITE_OTHER): Payer: BC Managed Care – PPO | Admitting: Family Medicine

## 2016-01-25 VITALS — BP 120/78 | HR 118 | Temp 99.2°F | Resp 16 | Ht 66.0 in | Wt 198.0 lb

## 2016-01-25 DIAGNOSIS — E1142 Type 2 diabetes mellitus with diabetic polyneuropathy: Secondary | ICD-10-CM

## 2016-01-25 DIAGNOSIS — E1169 Type 2 diabetes mellitus with other specified complication: Secondary | ICD-10-CM | POA: Diagnosis not present

## 2016-01-25 DIAGNOSIS — E785 Hyperlipidemia, unspecified: Secondary | ICD-10-CM

## 2016-01-25 DIAGNOSIS — M00061 Staphylococcal arthritis, right knee: Secondary | ICD-10-CM

## 2016-01-25 DIAGNOSIS — I10 Essential (primary) hypertension: Secondary | ICD-10-CM | POA: Diagnosis not present

## 2016-01-25 DIAGNOSIS — Z794 Long term (current) use of insulin: Secondary | ICD-10-CM | POA: Diagnosis not present

## 2016-01-25 LAB — POCT GLYCOSYLATED HEMOGLOBIN (HGB A1C): Hemoglobin A1C: 6.9

## 2016-01-25 MED ORDER — GLIMEPIRIDE 4 MG PO TABS: 4.0000 mg | ORAL_TABLET | Freq: Every day | ORAL | Status: DC

## 2016-01-25 MED ORDER — ATORVASTATIN CALCIUM 10 MG PO TABS: 10.0000 mg | ORAL_TABLET | Freq: Every day | ORAL | Status: DC

## 2016-01-25 MED ORDER — LISINOPRIL 20 MG PO TABS: 20.0000 mg | ORAL_TABLET | Freq: Every day | ORAL | Status: DC

## 2016-01-25 NOTE — Assessment & Plan Note (Signed)
Start atorvastatin 10mg  daily. Recheck hepatic function panel via HH RN in 3-4 weeks.

## 2016-01-25 NOTE — Assessment & Plan Note (Signed)
Great response to [facility]- continue current dosing. Reduce amaryl to once daily to prevent hypoglycemia. UA microalbumin UTD. Foot exam UTD. Needs eye exam, will schedule.  Recheck 3 mos.

## 2016-01-25 NOTE — Assessment & Plan Note (Signed)
Controlled. Continue current regimen. 

## 2016-01-25 NOTE — Assessment & Plan Note (Signed)
Followed by ortho and ID. Pt encouraged to continue to follow recommendations.

## 2016-01-25 NOTE — Patient Instructions (Signed)
Keep up the good work with your diabetes. Continue insulin dosing as your are doing.   Take Amaryl only once per day to see if that helps with low blood sugars.  Please schedule an eye exam.

## 2016-01-25 NOTE — Progress Notes (Signed)
Subjective:    Patient ID: Patricia [name], female    DOB: [birth year], 50 y.o.   MRN: [id number]  HPI: Patricia [name] is a 50 y.o. female presenting on 01/25/2016 for Diabetes   HPI  Pt presents for DM follow-up. Started on toujeo at last visit due to elevated A1c. Last A1c was 10.4%, Pt is currently taking 40 units daily ofToujeo for BG >120. Will take 20 units if <120. Past 2 mornings- 60 sugars at 3am. This AM 80. This has occurred twice. Pt reports abx is making her ill for joint infection- she eats sparingly. Needs eye exam. Sees [name]- will call to schedule.  Hypertension. Doing well. Controlled at home. No Cp, SOB, visual changes.  Recent infected joint after arthoscopy- had IV abx. PICC at home. Abx for 7 days. Doing blood work Thursday. Infected joint of the knee. Will follow with Dr. Rudene Christians. Saw ID today for follow-up infection.   Past Medical History  Diagnosis Date  . Diabetes mellitus without complication ([place])   . Hypertension   . Hyperlipidemia   . Anemia   . Depression   . Complication of anesthesia     aspiration after breathing tube removed after several surgeries    Current Outpatient Prescriptions on File Prior to Visit  Medication Sig  . blood glucose meter kit and supplies KIT Dispense based on patient and insurance preference. Use up to four times daily as directed. (FOR ICD-9 250.00, 250.01).  Marland Kitchen FLUoxetine (PROZAC) 40 MG capsule TAKE ONE CAPSULE BY MOUTH ONCE DAILY *PATIENT NEEDS TO MAKE APPOINTMENT*  . HYDROcodone-acetaminophen (NORCO/VICODIN) 5-325 MG tablet Take 1-2 tablets by mouth every 4 (four) hours as needed for moderate pain.  . Insulin Glargine (TOUJEO SOLOSTAR) 300 UNIT/ML SOPN Inject 40 Units into the skin at bedtime.  . metformin (FORTAMET) 1000 MG (OSM) 24 hr tablet TAKE ONE TABLET BY MOUTH TWICE DAILY  . metoprolol tartrate (LOPRESSOR) 25 MG tablet TAKE ONE TABLET BY MOUTH TWICE DAILY  . ONE TOUCH ULTRA TEST test strip CHECK BLOOD  GLUCOSE UP TO FOUR TIMES DAILY AS DIRECTED  . ONETOUCH DELICA LANCETS 79K MISC USE TO CHECK BLOOD GLUCOSE UP TO FOUR TIMES DAILY AS DIRECTED  . oxyCODONE (OXY IR/ROXICODONE) 5 MG immediate release tablet Take 1-2 tablets (5-10 mg total) by mouth every 3 (three) hours as needed for breakthrough pain.  Marland Kitchen RELION PEN NEEDLES 29G X 12MM MISC USE AS DIRECTED DAILY  . sodium chloride flush (NS) 0.9 % SOLN 10-40 mLs by Intracatheter route as needed (flush).   No current facility-administered medications on file prior to visit.    Review of Systems  Constitutional: Negative for fever and chills.  HENT: Negative.   Respiratory: Negative for cough, chest tightness and wheezing.   Cardiovascular: Negative for chest pain and leg swelling.  Gastrointestinal: Negative for nausea, vomiting, abdominal pain, diarrhea and constipation.  Endocrine: Negative.  Negative for cold intolerance, heat intolerance, polydipsia, polyphagia and polyuria.  Genitourinary: Negative for dysuria and difficulty urinating.  Musculoskeletal: Positive for joint swelling and gait problem (non-weight bearing RLE.).  Neurological: Negative for dizziness, light-headedness and numbness.  Psychiatric/Behavioral: Negative.    Per HPI unless specifically indicated above     Objective:    BP 120/78 mmHg  Pulse 118  Temp(Src) 99.2 F (37.3 C) (Oral)  Resp 16  Ht '5\' 6"'$  (1.676 m)  Wt 198 lb (89.812 kg)  BMI 31.97 kg/m2  Wt Readings from Last 3 Encounters:  01/25/16 198 lb (  89.812 kg)  11/30/15 200 lb (90.719 kg)  10/05/15 199 lb 14.4 oz (90.674 kg)    Physical Exam  Constitutional: She is oriented to person, place, and time. She appears well-developed and well-nourished.  HENT:  Head: Normocephalic and atraumatic.  Neck: Neck supple.  Cardiovascular: Normal rate, regular rhythm and normal heart sounds.  Exam reveals no gallop and no friction rub.   No murmur heard. Pulmonary/Chest: Effort normal and breath sounds normal.  She has no wheezes. She exhibits no tenderness.  Abdominal: Soft. Normal appearance and bowel sounds are normal. She exhibits no distension and no mass. There is no tenderness. There is no rebound and no guarding.  Musculoskeletal: She exhibits no edema.       Right knee: She exhibits decreased range of motion, swelling, effusion and erythema. She exhibits no bony tenderness. Tenderness found. Medial joint line tenderness noted.  R knee tenderness medial side below joint space at the tibia. Large effusion and swelling lateral knee up to quadriceps.  Knee brace in place.   Lymphadenopathy:    She has no cervical adenopathy.  Neurological: She is alert and oriented to person, place, and time.  Skin: Skin is warm and dry.  PICC line present RUE.    Results for orders placed or performed in visit on 01/25/16  POCT HgB A1C  Result Value Ref Range   Hemoglobin A1C 6.9       Assessment & Plan:   Problem List Items Addressed This Visit      Cardiovascular and Mediastinum   Hypertension    Controlled. Continue current regimen.       Relevant Medications   aspirin EC 81 MG tablet   metoprolol succinate (TOPROL-XL) 25 MG 24 hr tablet   atorvastatin (LIPITOR) 10 MG tablet   lisinopril (PRINIVIL,ZESTRIL) 20 MG tablet     Endocrine   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 polyneuropathy, without long-term current use of insulin (HCC) - Primary    Great response to Toujeo- continue current dosing. Reduce amaryl to once daily to prevent hypoglycemia. UA microalbumin UTD. Foot exam UTD. Needs eye exam, will schedule.  Recheck 3 mos.       Relevant Medications   aspirin EC 81 MG tablet   atorvastatin (LIPITOR) 10 MG tablet   glimepiride (AMARYL) 4 MG tablet   lisinopril (PRINIVIL,ZESTRIL) 20 MG tablet     Musculoskeletal and Integument   Septic arthritis of knee, right (HCC)    Followed by ortho and ID. Pt encouraged to continue to follow recommendations.       Relevant  Medications   celecoxib (CELEBREX) 200 MG capsule   aspirin EC 81 MG tablet   cefUROXime (CEFTIN) 500 MG tablet     Other   Hyperlipidemia due to type 2 diabetes mellitus ([place])    Start atorvastatin '10mg'$  daily. Recheck hepatic function panel via [place] RN in 3-4 weeks.       Relevant Medications   aspirin EC 81 MG tablet   metoprolol succinate (TOPROL-XL) 25 MG 24 hr tablet   atorvastatin (LIPITOR) 10 MG tablet   glimepiride (AMARYL) 4 MG tablet   lisinopril (PRINIVIL,ZESTRIL) 20 MG tablet      Meds ordered this encounter  Medications  . amoxicillin (AMOXIL) 500 MG capsule    Sig: Take by mouth.  . celecoxib (CELEBREX) 200 MG capsule    Sig: Take by mouth.  . famotidine (TH FAMOTIDINE 10) 10 MG tablet    Sig: Take by mouth.  Marland Kitchen  aspirin EC 81 MG tablet    Sig: Take by mouth.  . metoprolol succinate (TOPROL-XL) 25 MG 24 hr tablet    Sig: Take by mouth.  . cefUROXime (CEFTIN) 500 MG tablet    Sig:   . DISCONTD: celecoxib (CELEBREX) 200 MG capsule    Sig:   . atorvastatin (LIPITOR) 10 MG tablet    Sig: Take 1 tablet (10 mg total) by mouth daily.    Dispense:  90 tablet    Refill:  3    Order Specific Question:  Supervising Provider    Answer:  Arlis Porta [phone]  . glimepiride (AMARYL) 4 MG tablet    Sig: Take 1 tablet (4 mg total) by mouth daily with breakfast.    Dispense:  90 tablet    Refill:  3    Order Specific Question:  Supervising Provider    Answer:  Arlis Porta [phone]  . lisinopril (PRINIVIL,ZESTRIL) 20 MG tablet    Sig: Take 1 tablet (20 mg total) by mouth daily.    Dispense:  90 tablet    Refill:  3    Order Specific Question:  Supervising Provider    Answer:  Arlis Porta [[id number]]      Follow up plan: Return in about 3 months (around 04/26/2016) for diabetes. Marland Kitchen

## 2016-02-11 ENCOUNTER — Telehealth: Payer: Self-pay | Admitting: Family Medicine

## 2016-02-11 DIAGNOSIS — Z794 Long term (current) use of insulin: Principal | ICD-10-CM

## 2016-02-11 DIAGNOSIS — E1142 Type 2 diabetes mellitus with diabetic polyneuropathy: Secondary | ICD-10-CM

## 2016-02-11 MED ORDER — GLIMEPIRIDE 4 MG PO TABS: 8.0000 mg | ORAL_TABLET | Freq: Every day | ORAL | Status: DC

## 2016-02-11 NOTE — Telephone Encounter (Signed)
Pt got a new prescription of glimepiride and the directions are to take once daily.  She was taking in twice daily and her sugar was staying down.  It's running high  just using once daily.  Please call [phone]

## 2016-02-11 NOTE — Telephone Encounter (Signed)
It was reduced to once daily to hopefully prevent any hypoglycemia since her sugars were doing so well.  We can go back up to twice daily- just monitor sugars carefully. Call with any blood sugars <70 or symptoms of hypoglycemia.

## 2016-02-11 NOTE — Telephone Encounter (Signed)
Pt advised.

## 2016-04-01 ENCOUNTER — Other Ambulatory Visit: Payer: Self-pay | Admitting: Family Medicine

## 2016-04-01 MED ORDER — METFORMIN HCL 1000 MG PO TABS: 1000.0000 mg | ORAL_TABLET | Freq: Two times a day (BID) | ORAL | 11 refills | Status: DC

## 2016-06-26 ENCOUNTER — Other Ambulatory Visit: Payer: Self-pay | Admitting: Orthopedic Surgery

## 2016-06-26 DIAGNOSIS — M1711 Unilateral primary osteoarthritis, right knee: Secondary | ICD-10-CM

## 2016-07-03 ENCOUNTER — Ambulatory Visit
Admission: RE | Admit: 2016-07-03 | Discharge: 2016-07-03 | Disposition: A | Discharge status: Home / Self Care | Payer: BC Managed Care – PPO | Source: Ambulatory Visit | Attending: Orthopedic Surgery | Admitting: Orthopedic Surgery

## 2016-07-03 DIAGNOSIS — M533 Sacrococcygeal disorders, not elsewhere classified: Secondary | ICD-10-CM | POA: Diagnosis not present

## 2016-07-03 DIAGNOSIS — M1611 Unilateral primary osteoarthritis, right hip: Secondary | ICD-10-CM | POA: Diagnosis not present

## 2016-07-03 DIAGNOSIS — M1711 Unilateral primary osteoarthritis, right knee: Secondary | ICD-10-CM | POA: Diagnosis not present

## 2016-07-23 ENCOUNTER — Encounter
Admission: RE | Admit: 2016-07-23 | Discharge: 2016-07-23 | Disposition: A | Discharge status: Home / Self Care | Payer: BC Managed Care – PPO | Source: Ambulatory Visit | Attending: Orthopedic Surgery | Admitting: Orthopedic Surgery

## 2016-07-23 DIAGNOSIS — Z01810 Encounter for preprocedural cardiovascular examination: Secondary | ICD-10-CM | POA: Insufficient documentation

## 2016-07-23 DIAGNOSIS — E119 Type 2 diabetes mellitus without complications: Secondary | ICD-10-CM | POA: Diagnosis not present

## 2016-07-23 DIAGNOSIS — Z01812 Encounter for preprocedural laboratory examination: Secondary | ICD-10-CM | POA: Diagnosis present

## 2016-07-23 HISTORY — DX: Gastro-esophageal reflux disease without esophagitis: K21.9

## 2016-07-23 HISTORY — DX: Unspecified osteoarthritis, unspecified site: M19.90

## 2016-07-23 HISTORY — DX: Unspecified infectious disease: B99.9

## 2016-07-23 LAB — BASIC METABOLIC PANEL
Anion gap: 7 (ref 5–15)
BUN: 18 mg/dL (ref 6–20)
CHLORIDE: 107 mmol/L (ref 101–111)
CO2: 25 mmol/L (ref 22–32)
CREATININE: 0.82 mg/dL (ref 0.44–1.00)
Calcium: 10 mg/dL (ref 8.9–10.3)
Glucose, Bld: 106 mg/dL — ABNORMAL HIGH (ref 65–99)
Potassium: 4.3 mmol/L (ref 3.5–5.1)
SODIUM: 139 mmol/L (ref 135–145)

## 2016-07-23 LAB — TYPE AND SCREEN
ABO/RH(D): O POS
ANTIBODY SCREEN: NEGATIVE

## 2016-07-23 LAB — PROTIME-INR
INR: 0.9
PROTHROMBIN TIME: 12.1 s (ref 11.4–15.2)

## 2016-07-23 LAB — SEDIMENTATION RATE: Sed Rate: 35 mm/hr — ABNORMAL HIGH (ref 0–30)

## 2016-07-23 LAB — URINALYSIS COMPLETE WITH MICROSCOPIC (ARMC ONLY)
BILIRUBIN URINE: NEGATIVE
Bacteria, UA: NONE SEEN
Glucose, UA: NEGATIVE mg/dL
Hgb urine dipstick: NEGATIVE
KETONES UR: NEGATIVE mg/dL
NITRITE: NEGATIVE
PROTEIN: NEGATIVE mg/dL
Specific Gravity, Urine: 1.031 — ABNORMAL HIGH (ref 1.005–1.030)
pH: 5 (ref 5.0–8.0)

## 2016-07-23 LAB — SURGICAL PCR SCREEN
MRSA, PCR: NEGATIVE
STAPHYLOCOCCUS AUREUS: POSITIVE — AB

## 2016-07-23 LAB — CBC
HEMATOCRIT: 34.5 % — AB (ref 35.0–47.0)
Hemoglobin: 11.5 g/dL — ABNORMAL LOW (ref 12.0–16.0)
MCH: 27 pg (ref 26.0–34.0)
MCHC: 33.4 g/dL (ref 32.0–36.0)
MCV: 80.8 fL (ref 80.0–100.0)
PLATELETS: 474 10*3/uL — AB (ref 150–440)
RBC: 4.27 MIL/uL (ref 3.80–5.20)
RDW: 14.7 % — AB (ref 11.5–14.5)
WBC: 9.7 10*3/uL (ref 3.6–11.0)

## 2016-07-23 LAB — APTT: aPTT: 25 seconds (ref 24–36)

## 2016-07-23 NOTE — OR Nursing (Signed)
Patient states that she has had several situations with anesthesia that she has had problems with aspirating when waking up.

## 2016-07-23 NOTE — Patient Instructions (Addendum)
  Your procedure is scheduled on: August 08, 2016  Report to the [facility]ICAL [facility], SECOND FLOOR  To find out your arrival time please call [phone] between 1PM - 3PM on Thursday, August 07, 2016  Remember: Instructions that are not followed completely may result in serious medical risk, up to and including death, or upon the discretion of your surgeon and anesthesiologist your surgery may need to be rescheduled.    __X__ 1. Do not eat food or drink liquids after midnight. No gum chewing or hard candies.      __X__ 2. No Alcohol for 24 hours before or after surgery.   ____ 3. Do Not Smoke For 24 Hours Prior to Your Surgery.   ____ 4. Bring all medications with you on the day of surgery if instructed.    __X__ 5. Notify your doctor if there is any change in your medical condition     (cold, fever, infections).       Do not wear jewelry, make-up, hairpins, clips or nail polish.   Do not wear lotions, powders, or perfumes. You may wear deodorant.   Do not shave 48 hours prior to surgery. Men may shave face and neck.   Do not bring valuables to the hospital.     [facility] not [facility] any belongings or valuables.               Contacts, dentures or bridgework may not be worn into surgery.  Leave your suitcase in the car. After surgery it may be brought to your room.  For patients admitted to the hospital, discharge time is determined by your treatment team.   Patients discharged the day of surgery will not be allowed to drive home.   Please read over the following fact sheets that you were given:   MRSA FACT SHEET             CHG SOAP INSTRUCTION SHEET  __X__ Take these medicines the morning of surgery with A SIP OF WATER:    1. Lipitor  2. Metoprolol  3. Pepcid  4. Prozac  5. Celebrex and Tramadol if needed on the morning of surgery  6.  ____ Fleet Enema (as directed)   __X__ Use CHG Soap as directed  ____ Use inhalers on the day of  surgery  __X__Stop metformin 2 days prior to surgery.  DO NOT TAKE FROM 08/06/16 UNTIL SURGERY. DO NOT TAKE GLIMIPERIDE ON THE MORNING OF SURGERY BUT CONTINUE TO TAKE IT UP UNTIL THEN.  ___X_  Take 1/2 of usual insulin dose the night before surgery and none on the morning of surgery.   ____ Stop Coumadin/Plavix/aspirin 7 DAYS BEFORE SURGERY  ____ Stop Anti-inflammatories NOW except you may continue to take your Celebrex if needed up till surgery   ____ Stop supplements until after surgery.    ____ Bring C-Pap to the hospital.

## 2016-07-24 LAB — URINE CULTURE

## 2016-07-25 NOTE — Pre-Procedure Instructions (Signed)
1) Urine culture results sent to Dr. Rosita KeaMenz for review.  Asked if wanted it recollected as suggested? 2) Positive staph aureus results sent to Dr. Rosita KeaMenz for review.  Asked if wanted patient to have any treatment?

## 2016-08-08 ENCOUNTER — Encounter
Admission: RE | Disposition: A | Discharge status: Home Health | Payer: Self-pay | Source: Ambulatory Visit | Attending: Orthopedic Surgery

## 2016-08-08 ENCOUNTER — Encounter: Payer: Self-pay | Admitting: *Deleted

## 2016-08-08 ENCOUNTER — Inpatient Hospital Stay: Payer: BC Managed Care – PPO

## 2016-08-08 ENCOUNTER — Inpatient Hospital Stay: Payer: BC Managed Care – PPO | Admitting: Certified Registered Nurse Anesthetist

## 2016-08-08 ENCOUNTER — Inpatient Hospital Stay
Admission: RE | Admit: 2016-08-08 | Discharge: 2016-08-11 | DRG: 470 | Disposition: A | Discharge status: Home Health | Payer: BC Managed Care – PPO | Source: Ambulatory Visit | Attending: Orthopedic Surgery | Admitting: Orthopedic Surgery

## 2016-08-08 DIAGNOSIS — Z6832 Body mass index (BMI) 32.0-32.9, adult: Secondary | ICD-10-CM

## 2016-08-08 DIAGNOSIS — F329 Major depressive disorder, single episode, unspecified: Secondary | ICD-10-CM | POA: Diagnosis present

## 2016-08-08 DIAGNOSIS — E785 Hyperlipidemia, unspecified: Secondary | ICD-10-CM | POA: Diagnosis present

## 2016-08-08 DIAGNOSIS — R29810 Facial weakness: Secondary | ICD-10-CM

## 2016-08-08 DIAGNOSIS — M25561 Pain in right knee: Secondary | ICD-10-CM

## 2016-08-08 DIAGNOSIS — I10 Essential (primary) hypertension: Secondary | ICD-10-CM | POA: Diagnosis present

## 2016-08-08 DIAGNOSIS — K219 Gastro-esophageal reflux disease without esophagitis: Secondary | ICD-10-CM | POA: Diagnosis present

## 2016-08-08 DIAGNOSIS — E875 Hyperkalemia: Secondary | ICD-10-CM | POA: Diagnosis not present

## 2016-08-08 DIAGNOSIS — N390 Urinary tract infection, site not specified: Secondary | ICD-10-CM | POA: Diagnosis present

## 2016-08-08 DIAGNOSIS — Z8614 Personal history of Methicillin resistant Staphylococcus aureus infection: Secondary | ICD-10-CM

## 2016-08-08 DIAGNOSIS — Z7984 Long term (current) use of oral hypoglycemic drugs: Secondary | ICD-10-CM | POA: Diagnosis not present

## 2016-08-08 DIAGNOSIS — D62 Acute posthemorrhagic anemia: Secondary | ICD-10-CM | POA: Diagnosis not present

## 2016-08-08 DIAGNOSIS — R262 Difficulty in walking, not elsewhere classified: Secondary | ICD-10-CM

## 2016-08-08 DIAGNOSIS — Z9049 Acquired absence of other specified parts of digestive tract: Secondary | ICD-10-CM

## 2016-08-08 DIAGNOSIS — G8918 Other acute postprocedural pain: Secondary | ICD-10-CM

## 2016-08-08 DIAGNOSIS — E1142 Type 2 diabetes mellitus with diabetic polyneuropathy: Secondary | ICD-10-CM | POA: Diagnosis present

## 2016-08-08 DIAGNOSIS — M1711 Unilateral primary osteoarthritis, right knee: Principal | ICD-10-CM | POA: Diagnosis present

## 2016-08-08 DIAGNOSIS — R2681 Unsteadiness on feet: Secondary | ICD-10-CM

## 2016-08-08 HISTORY — PX: TOTAL KNEE ARTHROPLASTY: SHX125

## 2016-08-08 LAB — CBC
HEMATOCRIT: 27.4 % — AB (ref 35.0–47.0)
HEMOGLOBIN: 9.2 g/dL — AB (ref 12.0–16.0)
MCH: 26.9 pg (ref 26.0–34.0)
MCHC: 33.4 g/dL (ref 32.0–36.0)
MCV: 80.6 fL (ref 80.0–100.0)
Platelets: 341 10*3/uL (ref 150–440)
RBC: 3.4 MIL/uL — AB (ref 3.80–5.20)
RDW: 15 % — AB (ref 11.5–14.5)
WBC: 8 10*3/uL (ref 3.6–11.0)

## 2016-08-08 LAB — CREATININE, SERUM: Creatinine, Ser: 0.86 mg/dL (ref 0.44–1.00)

## 2016-08-08 LAB — GLUCOSE, CAPILLARY
GLUCOSE-CAPILLARY: 115 mg/dL — AB (ref 65–99)
GLUCOSE-CAPILLARY: 91 mg/dL (ref 65–99)
GLUCOSE-CAPILLARY: 45 mg/dL — AB (ref 65–99)
GLUCOSE-CAPILLARY: 65 mg/dL (ref 65–99)
GLUCOSE-CAPILLARY: 137 mg/dL — AB (ref 65–99)

## 2016-08-08 SURGERY — ARTHROPLASTY, KNEE, TOTAL
Anesthesia: Spinal | Site: Knee | Laterality: Right | Wound class: Clean

## 2016-08-08 MED ORDER — ACETAMINOPHEN 325 MG PO TABS
650.0000 mg | ORAL_TABLET | Freq: Four times a day (QID) | ORAL | Status: DC | PRN
  Administered 2016-08-09 – 2016-08-10 (×4): 650 mg via ORAL
  Filled 2016-08-08 (×4): qty 2

## 2016-08-08 MED ORDER — ASPIRIN EC 81 MG PO TBEC
81.0000 mg | DELAYED_RELEASE_TABLET | Freq: Every day | ORAL | Status: DC
  Administered 2016-08-08 – 2016-08-11 (×4): 81 mg via ORAL
  Filled 2016-08-08 (×4): qty 1

## 2016-08-08 MED ORDER — ONDANSETRON HCL 4 MG/2ML IJ SOLN
INTRAMUSCULAR | Status: DC | PRN
Start: 2016-08-08 — End: 2016-08-08
  Administered 2016-08-08: 4 mg via INTRAVENOUS

## 2016-08-08 MED ORDER — BUPIVACAINE-EPINEPHRINE (PF) 0.25% -1:200000 IJ SOLN
INTRAMUSCULAR | Status: DC | PRN
  Administered 2016-08-08: 30 mL

## 2016-08-08 MED ORDER — KETOROLAC TROMETHAMINE 30 MG/ML IJ SOLN
INTRAMUSCULAR | Status: DC | PRN
  Administered 2016-08-08: 30 mg

## 2016-08-08 MED ORDER — FENTANYL CITRATE (PF) 100 MCG/2ML IJ SOLN: 25.0000 ug | INTRAMUSCULAR | Status: DC | PRN

## 2016-08-08 MED ORDER — MORPHINE SULFATE (PF) 2 MG/ML IV SOLN: 2.0000 mg | INTRAVENOUS | Status: DC | PRN

## 2016-08-08 MED ORDER — SODIUM CHLORIDE 0.9 % IV SOLN
INTRAVENOUS | Status: DC
  Administered 2016-08-08: 09:00:00 via INTRAVENOUS

## 2016-08-08 MED ORDER — METOCLOPRAMIDE HCL 10 MG PO TABS: 5.0000 mg | ORAL_TABLET | Freq: Three times a day (TID) | ORAL | Status: DC | PRN

## 2016-08-08 MED ORDER — VANCOMYCIN HCL 1000 MG IV SOLR
INTRAVENOUS | Status: DC | PRN
  Administered 2016-08-08: 1000 mg

## 2016-08-08 MED ORDER — MENTHOL 3 MG MT LOZG
1.0000 | LOZENGE | OROMUCOSAL | Status: DC | PRN
  Filled 2016-08-08: qty 9

## 2016-08-08 MED ORDER — OXYCODONE HCL 5 MG/5ML PO SOLN: 5.0000 mg | Freq: Once | ORAL | Status: DC | PRN

## 2016-08-08 MED ORDER — DIPHENHYDRAMINE HCL 12.5 MG/5ML PO ELIX: 12.5000 mg | ORAL_SOLUTION | ORAL | Status: DC | PRN

## 2016-08-08 MED ORDER — TRANEXAMIC ACID 1000 MG/10ML IV SOLN
1000.0000 mg | INTRAVENOUS | Status: DC
  Filled 2016-08-08: qty 10

## 2016-08-08 MED ORDER — GLUCOSE 40 % PO GEL
ORAL | Status: AC
  Administered 2016-08-08: 37.5 g
  Filled 2016-08-08: qty 1

## 2016-08-08 MED ORDER — ONDANSETRON HCL 4 MG PO TABS: 4.0000 mg | ORAL_TABLET | Freq: Four times a day (QID) | ORAL | Status: DC | PRN

## 2016-08-08 MED ORDER — GLUCOSE 40 % PO GEL
1.0000 | Freq: Once | ORAL | Status: DC
  Filled 2016-08-08: qty 1

## 2016-08-08 MED ORDER — SODIUM CHLORIDE 0.9 % IJ SOLN
INTRAMUSCULAR | Status: AC
  Filled 2016-08-08: qty 50

## 2016-08-08 MED ORDER — NEOMYCIN-POLYMYXIN B GU 40-200000 IR SOLN
Status: DC | PRN
  Administered 2016-08-08: 14 mL

## 2016-08-08 MED ORDER — FLUOXETINE HCL 20 MG PO CAPS
40.0000 mg | ORAL_CAPSULE | Freq: Every day | ORAL | Status: DC
  Administered 2016-08-09 – 2016-08-11 (×3): 40 mg via ORAL
  Filled 2016-08-08 (×3): qty 2

## 2016-08-08 MED ORDER — MORPHINE SULFATE 10 MG/ML IJ SOLN
INTRAMUSCULAR | Status: DC | PRN
  Administered 2016-08-08: 10 mg

## 2016-08-08 MED ORDER — OXYCODONE HCL 5 MG PO TABS: 5.0000 mg | ORAL_TABLET | Freq: Once | ORAL | Status: DC | PRN

## 2016-08-08 MED ORDER — CEFAZOLIN SODIUM-DEXTROSE 2-4 GM/100ML-% IV SOLN
2.0000 g | Freq: Four times a day (QID) | INTRAVENOUS | Status: DC
  Administered 2016-08-08 – 2016-08-11 (×11): 2 g via INTRAVENOUS
  Filled 2016-08-08 (×16): qty 100

## 2016-08-08 MED ORDER — ATORVASTATIN CALCIUM 10 MG PO TABS
10.0000 mg | ORAL_TABLET | Freq: Every day | ORAL | Status: DC
  Administered 2016-08-08 – 2016-08-11 (×4): 10 mg via ORAL
  Filled 2016-08-08 (×4): qty 1

## 2016-08-08 MED ORDER — BUPIVACAINE HCL (PF) 0.5 % IJ SOLN
INTRAMUSCULAR | Status: DC | PRN
  Administered 2016-08-08: 3 mL

## 2016-08-08 MED ORDER — METOCLOPRAMIDE HCL 5 MG/ML IJ SOLN: 5.0000 mg | Freq: Three times a day (TID) | INTRAMUSCULAR | Status: DC | PRN

## 2016-08-08 MED ORDER — METFORMIN HCL 500 MG PO TABS
1000.0000 mg | ORAL_TABLET | Freq: Two times a day (BID) | ORAL | Status: DC
  Administered 2016-08-08 – 2016-08-11 (×4): 1000 mg via ORAL
  Filled 2016-08-08 (×4): qty 2

## 2016-08-08 MED ORDER — ENOXAPARIN SODIUM 30 MG/0.3ML ~~LOC~~ SOLN
30.0000 mg | Freq: Two times a day (BID) | SUBCUTANEOUS | Status: DC
  Administered 2016-08-09 – 2016-08-11 (×5): 30 mg via SUBCUTANEOUS
  Filled 2016-08-08 (×5): qty 0.3

## 2016-08-08 MED ORDER — METHOCARBAMOL 500 MG PO TABS
500.0000 mg | ORAL_TABLET | Freq: Four times a day (QID) | ORAL | Status: DC | PRN
  Administered 2016-08-08: 500 mg via ORAL
  Filled 2016-08-08: qty 1

## 2016-08-08 MED ORDER — MAGNESIUM CITRATE PO SOLN
1.0000 | Freq: Once | ORAL | Status: DC | PRN
  Filled 2016-08-08: qty 296

## 2016-08-08 MED ORDER — CEFAZOLIN SODIUM-DEXTROSE 2-4 GM/100ML-% IV SOLN
2.0000 g | Freq: Once | INTRAVENOUS | Status: AC
Start: 2016-08-08 — End: 2016-08-08
  Administered 2016-08-08: 2 g via INTRAVENOUS

## 2016-08-08 MED ORDER — FAMOTIDINE 20 MG PO TABS
10.0000 mg | ORAL_TABLET | Freq: Two times a day (BID) | ORAL | Status: DC
  Administered 2016-08-08 – 2016-08-11 (×6): 10 mg via ORAL
  Filled 2016-08-08 (×7): qty 1

## 2016-08-08 MED ORDER — PHENOL 1.4 % MT LIQD
1.0000 | OROMUCOSAL | Status: DC | PRN
  Filled 2016-08-08: qty 177

## 2016-08-08 MED ORDER — ONDANSETRON HCL 4 MG/2ML IJ SOLN
4.0000 mg | Freq: Four times a day (QID) | INTRAMUSCULAR | Status: DC | PRN
  Administered 2016-08-09: 4 mg via INTRAVENOUS
  Filled 2016-08-08: qty 2

## 2016-08-08 MED ORDER — DOCUSATE SODIUM 100 MG PO CAPS
100.0000 mg | ORAL_CAPSULE | Freq: Two times a day (BID) | ORAL | Status: DC
  Administered 2016-08-08 – 2016-08-11 (×7): 100 mg via ORAL
  Filled 2016-08-08 (×7): qty 1

## 2016-08-08 MED ORDER — BISACODYL 10 MG RE SUPP: 10.0000 mg | Freq: Every day | RECTAL | Status: DC | PRN

## 2016-08-08 MED ORDER — INSULIN ASPART 100 UNIT/ML ~~LOC~~ SOLN
0.0000 [IU] | Freq: Three times a day (TID) | SUBCUTANEOUS | Status: DC
  Filled 2016-08-08: qty 2

## 2016-08-08 MED ORDER — FENTANYL CITRATE (PF) 100 MCG/2ML IJ SOLN
INTRAMUSCULAR | Status: DC | PRN
  Administered 2016-08-08: 25 ug via INTRAVENOUS

## 2016-08-08 MED ORDER — INSULIN GLARGINE 100 UNIT/ML ~~LOC~~ SOLN
40.0000 [IU] | Freq: Every day | SUBCUTANEOUS | Status: DC
  Administered 2016-08-08: 40 [IU] via SUBCUTANEOUS
  Filled 2016-08-08 (×4): qty 0.4

## 2016-08-08 MED ORDER — SODIUM CHLORIDE 0.9 % IV SOLN
INTRAVENOUS | Status: DC | PRN
  Administered 2016-08-08: 60 mL

## 2016-08-08 MED ORDER — OXYCODONE HCL 5 MG PO TABS
5.0000 mg | ORAL_TABLET | ORAL | Status: DC | PRN
  Administered 2016-08-08 – 2016-08-09 (×3): 10 mg via ORAL
  Administered 2016-08-09 (×2): 5 mg via ORAL
  Administered 2016-08-09 – 2016-08-11 (×11): 10 mg via ORAL
  Filled 2016-08-08 (×2): qty 2
  Filled 2016-08-08: qty 1
  Filled 2016-08-08 (×10): qty 2
  Filled 2016-08-08: qty 1
  Filled 2016-08-08 (×2): qty 2

## 2016-08-08 MED ORDER — METHOCARBAMOL 1000 MG/10ML IJ SOLN
500.0000 mg | Freq: Four times a day (QID) | INTRAVENOUS | Status: DC | PRN
  Filled 2016-08-08: qty 5

## 2016-08-08 MED ORDER — LISINOPRIL 20 MG PO TABS
20.0000 mg | ORAL_TABLET | Freq: Every day | ORAL | Status: DC
  Administered 2016-08-11: 20 mg via ORAL
  Filled 2016-08-08: qty 1

## 2016-08-08 MED ORDER — SODIUM CHLORIDE 0.9 % IV SOLN
INTRAVENOUS | Status: DC
  Administered 2016-08-08 – 2016-08-09 (×2): via INTRAVENOUS

## 2016-08-08 MED ORDER — ACETAMINOPHEN 650 MG RE SUPP: 650.0000 mg | Freq: Four times a day (QID) | RECTAL | Status: DC | PRN

## 2016-08-08 MED ORDER — ZOLPIDEM TARTRATE 5 MG PO TABS: 5.0000 mg | ORAL_TABLET | Freq: Every evening | ORAL | Status: DC | PRN

## 2016-08-08 MED ORDER — MIDAZOLAM HCL 5 MG/5ML IJ SOLN
INTRAMUSCULAR | Status: DC | PRN
  Administered 2016-08-08: 2 mg via INTRAVENOUS
  Administered 2016-08-08 (×4): 1 mg via INTRAVENOUS

## 2016-08-08 MED ORDER — SODIUM CHLORIDE 0.9 % IJ SOLN
INTRAMUSCULAR | Status: DC | PRN
  Administered 2016-08-08: 30 mL

## 2016-08-08 MED ORDER — METOPROLOL TARTRATE 25 MG PO TABS
25.0000 mg | ORAL_TABLET | Freq: Two times a day (BID) | ORAL | Status: DC
  Administered 2016-08-08 – 2016-08-11 (×3): 25 mg via ORAL
  Filled 2016-08-08 (×3): qty 1

## 2016-08-08 MED ORDER — MAGNESIUM HYDROXIDE 400 MG/5ML PO SUSP
30.0000 mL | Freq: Every day | ORAL | Status: DC | PRN
  Administered 2016-08-11: 30 mL via ORAL
  Filled 2016-08-08: qty 30

## 2016-08-08 MED ORDER — GLIMEPIRIDE 2 MG PO TABS
4.0000 mg | ORAL_TABLET | Freq: Two times a day (BID) | ORAL | Status: DC
  Administered 2016-08-08 – 2016-08-11 (×5): 4 mg via ORAL
  Filled 2016-08-08 (×6): qty 2

## 2016-08-08 SURGICAL SUPPLY — 63 items
BANDAGE ACE 6X5 VEL STRL LF (GAUZE/BANDAGES/DRESSINGS) ×3 IMPLANT
BLADE SAW 1 (BLADE) ×3 IMPLANT
BLOCK CUTTING FEMUR 3 RT MED (MISCELLANEOUS) IMPLANT
BLOCK CUTTING TIBIAL 2 RT (MISCELLANEOUS) IMPLANT
BLOCK CUTTING TIBIAL 4 RT MIS (MISCELLANEOUS) IMPLANT
BONE CEMENT GENTAMICIN (Cement) ×6 IMPLANT
CANISTER SUCT 1200ML W/VALVE (MISCELLANEOUS) ×3 IMPLANT
CANISTER SUCT 3000ML (MISCELLANEOUS) ×6 IMPLANT
CAPT KNEE TOTAL 3 ×3 IMPLANT
CATH FOL LEG HOLDER (MISCELLANEOUS) ×3 IMPLANT
CATH TRAY METER 16FR LF (MISCELLANEOUS) ×3 IMPLANT
CEMENT BONE GENTAMICIN 40 (Cement) ×2 IMPLANT
CHLORAPREP W/TINT 26ML (MISCELLANEOUS) ×6 IMPLANT
COOLER POLAR GLACIER W/PUMP (MISCELLANEOUS) ×3 IMPLANT
CUFF TOURN 24 STER (MISCELLANEOUS) IMPLANT
CUFF TOURN 30 STER DUAL PORT (MISCELLANEOUS) IMPLANT
DRAPE INCISE IOBAN 66X45 STRL (DRAPES) ×6 IMPLANT
DRAPE SHEET LG 3/4 BI-LAMINATE (DRAPES) ×6 IMPLANT
ELECT CAUTERY BLADE 6.4 (BLADE) ×3 IMPLANT
ELECT REM PT RETURN 9FT ADLT (ELECTROSURGICAL) ×3
ELECTRODE REM PT RTRN 9FT ADLT (ELECTROSURGICAL) ×1 IMPLANT
FEMUR BONE MODEL IMPLANT
GAUZE PETRO XEROFOAM 1X8 (MISCELLANEOUS) ×3 IMPLANT
GAUZE SPONGE 4X4 12PLY STRL (GAUZE/BANDAGES/DRESSINGS) ×3 IMPLANT
GLOVE BIOGEL PI IND STRL 9 (GLOVE) ×1 IMPLANT
GLOVE BIOGEL PI INDICATOR 9 (GLOVE) ×2
GLOVE INDICATOR 8.0 STRL GRN (GLOVE) ×3 IMPLANT
GLOVE SURG ORTHO 8.0 STRL STRW (GLOVE) ×3 IMPLANT
GLOVE SURG SYN 9.0  PF PI (GLOVE) ×2
GLOVE SURG SYN 9.0 PF PI (GLOVE) ×1 IMPLANT
GOWN SRG 2XL LVL 4 RGLN SLV (GOWNS) ×1 IMPLANT
GOWN STRL NON-REIN 2XL LVL4 (GOWNS) ×2
GOWN STRL REUS W/ TWL LRG LVL3 (GOWN DISPOSABLE) ×1 IMPLANT
GOWN STRL REUS W/ TWL XL LVL3 (GOWN DISPOSABLE) ×1 IMPLANT
GOWN STRL REUS W/TWL LRG LVL3 (GOWN DISPOSABLE) ×2
GOWN STRL REUS W/TWL XL LVL3 (GOWN DISPOSABLE) ×2
HANDPIECE INTERPULSE COAX TIP (DISPOSABLE) ×2
HOOD PEEL AWAY FLYTE STAYCOOL (MISCELLANEOUS) ×6 IMPLANT
IMMBOLIZER KNEE 19 BLUE UNIV (SOFTGOODS) ×3 IMPLANT
KIT RM TURNOVER STRD PROC AR (KITS) ×3 IMPLANT
KIT STIMULAN RAPID CURE 5CC (Orthopedic Implant) ×3 IMPLANT
KNEE MEDACTA TIBIAL/FEMORAL BL (Knees) ×3 IMPLANT
KNIFE SCULPS 14X20 (INSTRUMENTS) ×3 IMPLANT
NDL SAFETY 18GX1.5 (NEEDLE) ×3 IMPLANT
NEEDLE SPNL 18GX3.5 QUINCKE PK (NEEDLE) ×3 IMPLANT
NEEDLE SPNL 20GX3.5 QUINCKE YW (NEEDLE) ×3 IMPLANT
NS IRRIG 1000ML POUR BTL (IV SOLUTION) ×3 IMPLANT
PACK TOTAL KNEE (MISCELLANEOUS) ×3 IMPLANT
PAD WRAPON POLAR KNEE (MISCELLANEOUS) ×1 IMPLANT
SET HNDPC FAN SPRY TIP SCT (DISPOSABLE) ×1 IMPLANT
SOL .9 NS 3000ML IRR  AL (IV SOLUTION) ×2
SOL .9 NS 3000ML IRR UROMATIC (IV SOLUTION) ×1 IMPLANT
STAPLER SKIN PROX 35W (STAPLE) ×3 IMPLANT
SUCTION FRAZIER HANDLE 10FR (MISCELLANEOUS) ×2
SUCTION TUBE FRAZIER 10FR DISP (MISCELLANEOUS) ×1 IMPLANT
SUT DVC 2 QUILL PDO  T11 36X36 (SUTURE) ×2
SUT DVC 2 QUILL PDO T11 36X36 (SUTURE) ×1 IMPLANT
SUT V-LOC 90 ABS DVC 3-0 CL (SUTURE) ×3 IMPLANT
SYR 20CC LL (SYRINGE) ×3 IMPLANT
SYR 50ML LL SCALE MARK (SYRINGE) ×6 IMPLANT
TOWEL OR 17X26 4PK STRL BLUE (TOWEL DISPOSABLE) ×3 IMPLANT
TOWER CARTRIDGE SMART MIX (DISPOSABLE) ×3 IMPLANT
WRAPON POLAR PAD KNEE (MISCELLANEOUS) ×3

## 2016-08-08 NOTE — Care Management Note (Signed)
Case Management Note  Patient Details  Name: Patricia [name] MRN: [id number] Date of Birth: 1965/12/01  Subjective/Objective:  POD s/p  Right TKA. Met with patient at bedside. She is drowsy but alert and talkative. Discussed home health PT and offered choice of home health agency. Referral to [facility] for [facility]. Patient has a walker. Pharmacy: [facility] [address]: [phone]. Called Lovenox 40 mg # 14, no refills. Patient lives with her daughter who will be helping her and she has another daughter that  Lives a block away who will also be available.                   Action/Plan: [facility] for HHPT. Lovenox called in. No DME needs.   Expected Discharge Date:                  Expected Discharge Plan:  [place]  In-House Referral:     Discharge planning Services  CM Consult  Post Acute Care Choice:  Home Health Choice offered to:  Patient  DME Arranged:    DME Agency:     HH Arranged:  PT [place]:  [facility] [employer]  Status of Service:  In process, will continue to follow  If discussed at Long Length of Stay Meetings, dates discussed:    Additional Comments:  Jolly Mango, RN 08/08/2016, 3:54 PM

## 2016-08-08 NOTE — Plan of Care (Addendum)
Patient blood sugar 45. Patient alert, oriented and asymptomatic.  Given glutose 15 per protocol, OJ and graham crackers.  Will re-check in 15 minutes. Dr. Geradine Girtontacted. Rechecked BS - 65.  Patient ordered tray and is eating.

## 2016-08-08 NOTE — Anesthesia Preprocedure Evaluation (Addendum)
Anesthesia Evaluation  Patient identified by MRN, date of birth, ID band Patient awake    Reviewed: Allergy & Precautions, H&P , NPO status , Patient's Chart, lab work & pertinent test results  History of Anesthesia Complications Negative for: history of anesthetic complications  Airway Mallampati: III  TM Distance: <3 FB Neck ROM: full    Dental  (+) Poor Dentition   Pulmonary neg pulmonary ROS, neg shortness of breath,     + decreased breath sounds      Cardiovascular Exercise Tolerance: Good hypertension, Pt. on medications and Pt. on home beta blockers (-) angina(-) CAD and (-) DOE  Rhythm:regular Rate:Normal     Neuro/Psych PSYCHIATRIC DISORDERS Depression  Neuromuscular disease (diabetic peripheral neuropathy)    GI/Hepatic Neg liver ROS, GERD  Controlled,  Endo/Other  diabetes, Type 2, Oral Hypoglycemic Agents  Renal/GU negative Renal ROS     Musculoskeletal  (+) Arthritis ,   Abdominal   Peds  Hematology   Anesthesia Other Findings Past Medical History:   Diabetes mellitus without complication (HCC)                 Hypertension                                                 Hyperlipidemia                                               Anemia                                                       Depression                                                   Complication of anesthesia                                     Comment:aspiration after breathing tube removed after               several surgeries   Reproductive/Obstetrics                            Anesthesia Physical  Anesthesia Plan  ASA: III  Anesthesia Plan: Spinal   Post-op Pain Management:    Induction: Intravenous  Airway Management Planned: Nasal Cannula  Additional Equipment:   Intra-op Plan:   Post-operative Plan:   Informed Consent: I have reviewed the patients History and Physical, chart, labs and  discussed the procedure including the risks, benefits and alternatives for the proposed anesthesia with the patient or authorized representative who has indicated his/her understanding and acceptance.     Plan Discussed with: Anesthesiologist, CRNA and Surgeon  Anesthesia Plan Comments: (Patient has history of multiple aspirations with anesthesia.  With previous GETA case patient  had clean and quick intubation, RSI and clear OP with intubation, but then still had presentation of aspiration in PACU.  However, she did have radiographic findings so there is some thought from our team that she may be aspirating at home.  Plan for spinal with minimal sedation.  Patient informed that they are higher risk for complications from anesthesia during this procedure due to their medical history including but not limited to aspiration.  Patient voiced understanding. )       Anesthesia Quick Evaluation

## 2016-08-08 NOTE — Consult Note (Signed)
[facility] Infectious Disease     Reason for Consult: Hx Septic knee    Referring Physician: Dr Rudene Christians Date of Admission:  08/08/2016   Active Problems:   Primary osteoarthritis of right knee   HPI: Patricia [name] is a 50 y.o. female with OA and prior Strep Mitis native knee infection now s/p TKR. We are consulted for abx management. Prior to surgery did not have any swelling or redness in knee.   Her past history included a steroid injection March 1st but still had pain so had synvisc injection on March 24th. However knee pain became worse and had aspiration done 4/5 with 32K wbc, 95 % PMNS, and culture turned + for GPC. She underwent washout 4/7 and had possible aspiration event at that time. Started on vanco and zosyn. Cx grew Strep mitis.  Treated with prolonged Ceftriaxone followed by oral amox and sxs resolved.   Past Medical History:  Diagnosis Date  . Anemia   . Arthritis   . Complication of anesthesia 12/13/2015   aspiration after breathing tube removed after several surgeries  . Depression   . Diabetes mellitus without complication ([place])   . GERD (gastroesophageal reflux disease)   . Hyperlipidemia   . Hypertension   . Infection 2012   right knee infection, required arthroscopy, long term antibiotic treatment, aspiration therapy   Past Surgical History:  Procedure Laterality Date  . CHOLECYSTECTOMY    . KNEE ARTHROSCOPY Right 11/30/2015   Procedure: ARTHROSCOPY KNEE;  Surgeon: Hessie Knows, MD;  Location: [facility] ORS;  Service: Orthopedics;  Laterality: Right;  . KNEE ARTHROSCOPY Right 12/13/2015   Procedure: ARTHROSCOPY KNEE AND DEBRIDEMENT;  Surgeon: Hessie Knows, MD;  Location: [facility] ORS;  Service: Orthopedics;  Laterality: Right;  . MRSA abdominal wall    . Right knee arthroscopic    . TUBAL LIGATION  1997   Social History  Substance Use Topics  . Smoking status: Never Smoker  . Smokeless tobacco: Never Used  . Alcohol use No     Comment: rare   Family  History  Problem Relation Age of Onset  . Diabetes Mother   . CAD Mother   . Diabetes Father   . Hyperlipidemia Father     Allergies: No Known Allergies  Current antibiotics: Antibiotics Given (last 72 hours)    Date/Time Action Medication Dose   08/08/16 1042 Given   ceFAZolin (ANCEF) IVPB 2g/100 mL premix 2 g   08/08/16 1213 Given  [Mixed with Stimulin Beads]   vancomycin (VANCOCIN) powder 1,000 mg      MEDICATIONS: . insulin aspart  0-15 Units Subcutaneous TID WC    Review of Systems - 11 systems reviewed and negative per HPI   OBJECTIVE: Temp:  [97 F (36.1 C)-99.1 F (37.3 C)] 97.8 F (36.6 C) (12/15 1410) Pulse Rate:  [72-85] 78 (12/15 1410) Resp:  [10-18] 16 (12/15 1410) BP: (92-143)/(53-84) 107/62 (12/15 1410) SpO2:  [98 %-100 %] 100 % (12/15 1410) Weight:  [90.7 kg (200 lb)] 90.7 kg (200 lb) (12/15 0900) Physical Exam  Constitutional:  oriented to person, place, and time. appears well-developed and well-nourished. No distress.  HENT: [place]/AT, PERRLA, no scleral icterus Mouth/Throat: Oropharynx is clear and moist. No oropharyngeal exudate.  Cardiovascular: Normal rate, regular rhythm and normal heart sounds.  Pulmonary/Chest: Effort normal and breath sounds normal. No respiratory distress.  has no wheezes.  Neck  supple, no nuchal rigidity Abdominal: Soft. Bowel sounds are normal.  exhibits no distension. There is no tenderness.  Lymphadenopathy: no cervical adenopathy. No axillary adenopathy Neurological: alert and oriented to person, place, and time.  Skin: Skin is warm and dry. No rash noted. No erythema.  Psychiatric: a normal mood and affect.  behavior is normal.  Ext R knee wrapped post op LABS: Results for orders placed or performed during the hospital encounter of 08/08/16 (from the past 48 hour(s))  Glucose, capillary     Status: Abnormal   Collection Time: 08/08/16  8:58 AM  Result Value Ref Range   Glucose-Capillary 115 (H) 65 - 99 mg/dL  Type  and screen [place] admit to pre-op.     Status: None   Collection Time: 08/08/16  9:31 AM  Result Value Ref Range   ABO/RH(D) O POS    Antibody Screen NEG    Sample Expiration 08/11/2016   Glucose, capillary     Status: None   Collection Time: 08/08/16 12:52 PM  Result Value Ref Range   Glucose-Capillary 91 65 - 99 mg/dL   No components found for: ESR, C REACTIVE PROTEIN MICRO: Recent Results (from the past 720 hour(s))  Urine culture     Status: Abnormal   Collection Time: 07/23/16  3:27 PM  Result Value Ref Range Status   Specimen Description URINE, CLEAN CATCH  Final   Special Requests NONE  Final   Culture MULTIPLE SPECIES PRESENT, SUGGEST RECOLLECTION (A)  Final   Report Status 07/24/2016 FINAL  Final  Surgical pcr screen     Status: Abnormal   Collection Time: 07/23/16  3:27 PM  Result Value Ref Range Status   MRSA, PCR NEGATIVE NEGATIVE Final   Staphylococcus aureus POSITIVE (A) NEGATIVE Final    Comment:        The Xpert SA Assay (FDA approved for NASAL specimens in patients over 19 years of age), is one component of a comprehensive surveillance program.  Test performance has been validated by [facility] for patients greater than or equal to 6 year old. It is not intended to diagnose infection nor to guide or monitor treatment.     IMAGING: Dg Knee 1-2 Views Right  Result Date: 08/08/2016 CLINICAL DATA:  Postop right knee replacement EXAM: RIGHT KNEE - 1-2 VIEW COMPARISON:  Right knee CT 07/03/2016 FINDINGS: Status post right total knee arthroplasty with well seated femoral and tibial components. No periprosthetic fracture or other focal hardware abnormality. There is expected postoperative soft tissue gas. Numerous antibiotic beads are noted. IMPRESSION: Well seated right total knee arthroplasty components without focal hardware abnormality. Electronically Signed   By: Ulyses Jarred M.D.   On: 08/08/2016 14:01   Echo  - Procedure  narrative: Transthoracic echocardiography. Image  quality was poor. The study was technically difficult, as a  result of poor acoustic windows and poor sound wave transmission. - Left ventricle: The cavity size was normal. Systolic function was  normal. The estimated ejection fraction was in the range of 60%  to 65%. Wall motion was normal; there were no regional wall  motion abnormalities. Doppler parameters are consistent with  abnormal left ventricular relaxation (grade 1 diastolic  dysfunction). - Left atrium: The atrium was normal in size. - Right ventricle: Systolic function was normal. - Pulmonary arteries: Systolic pressure was within the normal  range.  Impressions:  - Normal study. No valve vegetation noted.  Assessment:   Patricia [name] is a 50 y.o. female with prior hx in April 2017 of Strep mitis R septic knee in setting of OA  and recent steroid then synvisc injection.  She was treated with IV ceftriaxone and then oral amoxicillin. She has been off abx for some time and today underwent TKR by Dr Rudene Christians. Most reecent CRP was 3.1 on 10/20 down from 79.9 in April and ESR 21 down form 85.   Now doing well with no evidence of infection but cultures pending  Recommendations Continue post op abx and follow up on cultures  Thank you very much for allowing me to participate in the care of this patient. Please call with questions.   Cheral Marker. Ola Spurr, MD

## 2016-08-08 NOTE — Anesthesia Procedure Notes (Signed)
Spinal  Patient location during procedure: OR Start time: 08/08/2016 10:18 AM End time: 08/08/2016 10:21 AM Staffing Anesthesiologist: Katy Fitch K Preanesthetic Checklist Completed: patient identified, site marked, surgical consent, pre-op evaluation, timeout performed, IV checked, risks and benefits discussed and monitors and equipment checked Spinal Block Patient position: sitting Prep: Betadine Patient monitoring: heart rate, continuous pulse ox, blood pressure and cardiac monitor Approach: midline Location: L3-4 Injection technique: single-shot Needle Needle type: [place] and Introducer  Needle gauge: 24 G Needle length: 9 cm Assessment Sensory level: T10 Additional Notes Negative paresthesia. Negative blood return. Positive free-flowing CSF. Expiration date of kit checked and confirmed. Patient tolerated procedure well, without complications.

## 2016-08-08 NOTE — Progress Notes (Signed)
Applied polar care and avi boots

## 2016-08-08 NOTE — [place] FL2 (Signed)
[place] MEDICAID FL2 LEVEL OF CARE SCREENING TOOL     IDENTIFICATION  Patient Name: Patricia [name] Birthdate: 07/25/1966 Sex: female Admission Date (Current Location): 08/08/2016  [place]   Facility and Address:  [facility], [address], [place], [place] [address]      Provider Number: [id number]  Attending Physician Name and Address:  Kennedy BuckerMichael Menz, MD  Relative Name and Phone Number:       Current Level of Care: Hospital Recommended Level of Care: Skilled Nursing Facility Prior Approval Number:    Date Approved/Denied:   PASRR Number:  [id number])  Discharge Plan: SNF    Current Diagnoses: Patient Active Problem List   Diagnosis Date Noted  . Primary osteoarthritis of right knee 08/08/2016  . Hyperlipidemia due to type 2 diabetes mellitus (HCC) 01/25/2016  . Septic joint of right knee joint (HCC) 12/13/2015  . Septic arthritis of knee, right (HCC) 11/30/2015  . Hypertension 09/05/2015  . BPPV (benign paroxysmal positional vertigo) 09/05/2015  . Insomnia 09/05/2015  . 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 polyneuropathy, without long-term current use of insulin (HCC) 09/05/2015    Orientation RESPIRATION BLADDER Height & Weight     Self, Time, Situation, Place  O2 (2 Liters Oxygen ) Continent Weight: 200 lb (90.7 kg) Height:  5\' 6"  (167.6 cm)  BEHAVIORAL SYMPTOMS/MOOD NEUROLOGICAL BOWEL NUTRITION STATUS   (none)  (none) Continent Diet (Regular Diet )  AMBULATORY STATUS COMMUNICATION OF NEEDS Skin   Extensive Assist Verbally Surgical wounds, Wound Vac (Incision: Right Knee (Provena disposable wound vac.) )                       Personal Care Assistance Level of Assistance  Bathing, Feeding, Dressing Bathing Assistance: Limited assistance Feeding assistance: Independent Dressing Assistance: Limited assistance     Functional Limitations Info  Sight, Hearing, Speech Sight  Info: Adequate Hearing Info: Adequate Speech Info: Adequate    SPECIAL CARE FACTORS FREQUENCY  PT (By licensed PT), OT (By licensed OT)     PT Frequency:  (5) OT Frequency:  (5)            Contractures      Additional Factors Info  Code Status, Allergies Code Status Info:  (Full Code. ) Allergies Info:  (No Known Allergies. )           Current Medications (08/08/2016):  This is the current hospital active medication list Current Facility-Administered Medications  Medication Dose Route Frequency Provider Last Rate Last Dose  . 0.9 %  sodium chloride infusion   Intravenous Continuous Kennedy BuckerMichael Menz, MD 75 mL/hr at 08/08/16 [phone]    . 0.9 %  sodium chloride infusion   Intravenous Continuous Kennedy BuckerMichael Menz, MD 75 mL/hr at 08/08/16 1507    . acetaminophen (TYLENOL) tablet 650 mg  650 mg Oral Q6H PRN Kennedy BuckerMichael Menz, MD       Or  . acetaminophen (TYLENOL) suppository 650 mg  650 mg Rectal Q6H PRN Kennedy BuckerMichael Menz, MD      . aspirin EC tablet 81 mg  81 mg Oral Daily Kennedy BuckerMichael Menz, MD   81 mg at 08/08/16 1518  . atorvastatin (LIPITOR) tablet 10 mg  10 mg Oral q1800 Kennedy BuckerMichael Menz, MD      . bisacodyl (DULCOLAX) suppository 10 mg  10 mg Rectal Daily PRN Kennedy BuckerMichael Menz, MD      . ceFAZolin (ANCEF) IVPB 2g/100 mL premix  2 g Intravenous  Q6H Kennedy BuckerMichael Menz, MD      . diphenhydrAMINE (BENADRYL) 12.5 MG/5ML elixir 12.5-25 mg  12.5-25 mg Oral Q4H PRN Kennedy BuckerMichael Menz, MD      . docusate sodium (COLACE) capsule 100 mg  100 mg Oral BID Kennedy BuckerMichael Menz, MD   100 mg at 08/08/16 1518  . [START ON 08/09/2016] enoxaparin (LOVENOX) injection 30 mg  30 mg Subcutaneous Q12H Kennedy BuckerMichael Menz, MD      . famotidine (PEPCID) tablet 10 mg  10 mg Oral BID Kennedy BuckerMichael Menz, MD      . Melene Muller[START ON 08/09/2016] FLUoxetine (PROZAC) capsule 40 mg  40 mg Oral Daily Kennedy BuckerMichael Menz, MD      . glimepiride (AMARYL) tablet 4 mg  4 mg Oral BID WC Kennedy BuckerMichael Menz, MD   4 mg at 08/08/16 1518  . insulin aspart (novoLOG) injection 0-15 Units  0-15 Units  Subcutaneous TID WC Kennedy BuckerMichael Menz, MD      . insulin glargine (LANTUS) injection 40 Units  40 Units Subcutaneous QHS Kennedy BuckerMichael Menz, MD      . lisinopril (PRINIVIL,ZESTRIL) tablet 20 mg  20 mg Oral Daily Kennedy BuckerMichael Menz, MD      . magnesium citrate solution 1 Bottle  1 Bottle Oral Once PRN Kennedy BuckerMichael Menz, MD      . magnesium hydroxide (MILK OF MAGNESIA) suspension 30 mL  30 mL Oral Daily PRN Kennedy BuckerMichael Menz, MD      . menthol-cetylpyridinium (CEPACOL) lozenge 3 mg  1 lozenge Oral PRN Kennedy BuckerMichael Menz, MD       Or  . phenol (CHLORASEPTIC) mouth spray 1 spray  1 spray Mouth/Throat PRN Kennedy BuckerMichael Menz, MD      . metFORMIN (GLUCOPHAGE) tablet 1,000 mg  1,000 mg Oral BID WC Kennedy BuckerMichael Menz, MD   1,000 mg at 08/08/16 1518  . methocarbamol (ROBAXIN) tablet 500 mg  500 mg Oral Q6H PRN Kennedy BuckerMichael Menz, MD   500 mg at 08/08/16 1518   Or  . methocarbamol (ROBAXIN) 500 mg in dextrose 5 % 50 mL IVPB  500 mg Intravenous Q6H PRN Kennedy BuckerMichael Menz, MD      . metoCLOPramide (REGLAN) tablet 5-10 mg  5-10 mg Oral Q8H PRN Kennedy BuckerMichael Menz, MD       Or  . metoCLOPramide (REGLAN) injection 5-10 mg  5-10 mg Intravenous Q8H PRN Kennedy BuckerMichael Menz, MD      . metoprolol tartrate (LOPRESSOR) tablet 25 mg  25 mg Oral BID Kennedy BuckerMichael Menz, MD      . morphine 2 MG/ML injection 2 mg  2 mg Intravenous Q1H PRN Kennedy BuckerMichael Menz, MD      . ondansetron [facility](ZOFRAN) tablet 4 mg  4 mg Oral Q6H PRN Kennedy BuckerMichael Menz, MD       Or  . ondansetron [facility](ZOFRAN) injection 4 mg  4 mg Intravenous Q6H PRN Kennedy BuckerMichael Menz, MD      . oxyCODONE (Oxy IR/ROXICODONE) immediate release tablet 5-10 mg  5-10 mg Oral Q3H PRN Kennedy BuckerMichael Menz, MD      . zolpidem [facility](AMBIEN) tablet 5 mg  5 mg Oral QHS PRN Kennedy BuckerMichael Menz, MD         Discharge Medications: Please see discharge summary for a list of discharge medications.  Relevant Imaging Results:  Relevant Lab Results:   Additional Information  (SSN: [id number])  Sharnise Blough, Darleen CrockerBailey M, LCSW

## 2016-08-08 NOTE — H&P (Signed)
Reviewed paper H+P, will be scanned into chart. No changes noted.  

## 2016-08-08 NOTE — Transfer of Care (Signed)
Immediate Anesthesia Transfer of Care Note  Patient: Patricia [name]  Procedure(s) Performed: Procedure(s): TOTAL KNEE ARTHROPLASTY (Right)  Patient Location: PACU  Anesthesia Type:Spinal  Level of Consciousness: awake, alert , oriented and patient cooperative  Airway & Oxygen Therapy: Patient Spontanous Breathing and Patient connected to nasal cannula oxygen  Post-op Assessment: Report given to RN and Post -op Vital signs reviewed and stable  Post vital signs: Reviewed and stable  Last Vitals:  Vitals:   08/08/16 0900 08/08/16 1250  BP: (!) 143/84 116/63  Pulse: 85 73  Resp: 18 13  Temp: 36.5 C     Last Pain:  Vitals:   08/08/16 1250  TempSrc:   PainSc: 0-No pain         Complications: No apparent anesthesia complications

## 2016-08-08 NOTE — Progress Notes (Signed)
PT Cancellation Note  Patient Details Name: Patricia [name] MRN: [id number] DOB: 08/15/1966   Cancelled Treatment:    Reason Eval/Treat Not Completed: Patient not medically ready (Consult received and chart reviewed. Patient currently lacking sensation from bilat knees distally; not appropriate for PT evaluation at this time. Will re-attempt next date as medically appropriate.)   Mery Guadalupe H. Manson PasseyBrown, PT, DPT, NCS 08/08/16, 3:02 PM [phone]

## 2016-08-08 NOTE — Progress Notes (Signed)
No urine culture recollection per Dr. Rosita KeaMenz.

## 2016-08-08 NOTE — Op Note (Signed)
08/08/2016  1:17 PM  PATIENT:  Patricia AskewPhyllis J [name]  50 y.o. female  PRE-OPERATIVE DIAGNOSIS:  PRIMARY OSTEOARTHRITIS RIGHT KNEE  POST-OPERATIVE DIAGNOSIS:  PRIMARY OSTEOARTHRITIS RIGHT KNEE  PROCEDURE:  Procedure(s): TOTAL KNEE ARTHROPLASTY (Right)  SURGEON: Leitha SchullerMichael J Jehu Mccauslin, MD  ASSISTANTS: [facility]  ANESTHESIA:   spinal  EBL:  Total I/O In: 800 [I.V.:800] Out: 475 [Urine:325; Blood:150]  BLOOD ADMINISTERED:none  DRAINS: none   LOCAL MEDICATIONS USED:  MARCAINE    and OTHER morphine Toradol and Exparel  SPECIMEN:  Source of Specimen:  Culture 2 synovial fluid  DISPOSITION OF SPECIMEN:  Microbiology lab  COUNTS:  YES  TOURNIQUET:   36 minutes at 300 mmHg  IMPLANTS: Medacta GMK sphere 3 femur right 2 tibia and 10 mm insert short stem and to patella all components cemented with antibiotic cement plus stimulan beads with vancomycin  DICTATION: .Dragon Dictation DICTATION:  patient brought the operating room and after adequate spinal anesthesia was obtained the right leg was prepped and draped in sterile fashion was turned by the upper thigh. After prepping and draping in usual sterile manner, appropriate patient identification and timeout procedures were completed. An skin incision made followed by medial parapatellar arthrotomy. Inspection revealed eburnated bone with significant bone loss in the medial compartment both tibia and femur with exposed bone on the femoral trochlea and patella as well as exposed bone laterally and the lateral compartment. There was extensive synovitis within the joint and overlying the cartilage that was remaining consistent with a history of prior infection. The fat pad and anterior cruciate ligament were excised and proximal tibia exposure for application of the my knee cutting block. My knee cutting block applied and proximal tibia cut carried out. After bone removal the same procedures carried out from the distal femur and distal femoral cut  made. The 4-in-1 cutting block was applied to the distal femur anterior posterior and chamfer cuts made with excision of the menisci at this time.  The proximal tibia was then adequately exposed for application of the size 2 tibial template was pinned in position and proximal tibial preparation carried out with a keel punch placed for trialing. The 3 femur was impacted on the femur and a 10 mm insert gave good stability with full extension possible. The distal femoral drill holes were made in the trochlear groove cut made with the reamer. Next the trials were removed and the patella cut using freehand technique after first cutting with the guide. It sized to a size 2 after drill holes were made. At this point the joint was thoroughly irrigated with a dilute Betadine solution and the above injections given antibiotic beads were created with vancomycin and placed in the gutters after components of been placed. After the injections were given the tourniquet was raised and the bony surfaces thoroughly irrigated and dried with the components then cemented in place first the tibia with setting the polyethylene and the femoral component holding the knee in extension as the cement set. Patellar button clamped into place. After the cement had set and excess cemented been removed tourniquet was let down and hemostasis checked electrocautery. The knee was again thoroughly irrigated and the arthrotomy closed using a heavy Quill.3-0 v-loc used subcutaneously and skin staples then applied. Xeroform 4 x 4's ABDs and web roll Polar Care and Ace wrap applied  PLAN OF CARE: Admit to inpatient   PATIENT DISPOSITION:  PACU - hemodynamically stable.

## 2016-08-09 LAB — BASIC METABOLIC PANEL
Anion gap: 3 — ABNORMAL LOW (ref 5–15)
BUN: 22 mg/dL — AB (ref 6–20)
CHLORIDE: 108 mmol/L (ref 101–111)
CO2: 25 mmol/L (ref 22–32)
Calcium: 8.7 mg/dL — ABNORMAL LOW (ref 8.9–10.3)
Creatinine, Ser: 1.05 mg/dL — ABNORMAL HIGH (ref 0.44–1.00)
GFR, EST NON AFRICAN AMERICAN: 60 mL/min — AB (ref >60)
Glucose, Bld: 161 mg/dL — ABNORMAL HIGH (ref 65–99)
POTASSIUM: 5.6 mmol/L — AB (ref 3.5–5.1)
SODIUM: 136 mmol/L (ref 135–145)

## 2016-08-09 LAB — GLUCOSE, CAPILLARY
GLUCOSE-CAPILLARY: 101 mg/dL — AB (ref 65–99)
GLUCOSE-CAPILLARY: 80 mg/dL (ref 65–99)
GLUCOSE-CAPILLARY: 109 mg/dL — AB (ref 65–99)
GLUCOSE-CAPILLARY: 74 mg/dL (ref 65–99)
Glucose-Capillary: 69 mg/dL (ref 65–99)
Glucose-Capillary: 98 mg/dL (ref 65–99)

## 2016-08-09 LAB — URINALYSIS, COMPLETE (UACMP) WITH MICROSCOPIC
BILIRUBIN URINE: NEGATIVE
Bacteria, UA: NONE SEEN
Glucose, UA: NEGATIVE mg/dL
KETONES UR: 5 mg/dL — AB
NITRITE: NEGATIVE
PH: 5 (ref 5.0–8.0)
Protein, ur: NEGATIVE mg/dL
SPECIFIC GRAVITY, URINE: 1.016 (ref 1.005–1.030)

## 2016-08-09 LAB — MAGNESIUM: MAGNESIUM: 1.5 mg/dL — AB (ref 1.7–2.4)

## 2016-08-09 LAB — POTASSIUM: Potassium: 5 mmol/L (ref 3.5–5.1)

## 2016-08-09 LAB — CBC
HCT: 25.5 % — ABNORMAL LOW (ref 35.0–47.0)
HEMOGLOBIN: 8.5 g/dL — AB (ref 12.0–16.0)
MCH: 27.2 pg (ref 26.0–34.0)
MCHC: 33.5 g/dL (ref 32.0–36.0)
MCV: 81.3 fL (ref 80.0–100.0)
PLATELETS: 340 10*3/uL (ref 150–440)
RBC: 3.13 MIL/uL — AB (ref 3.80–5.20)
RDW: 15 % — ABNORMAL HIGH (ref 11.5–14.5)
WBC: 8.3 10*3/uL (ref 3.6–11.0)

## 2016-08-09 MED ORDER — FE FUMARATE-B12-VIT C-FA-IFC PO CAPS
1.0000 | ORAL_CAPSULE | Freq: Every day | ORAL | Status: DC
  Administered 2016-08-09 – 2016-08-11 (×3): 1 via ORAL
  Filled 2016-08-09 (×3): qty 1

## 2016-08-09 NOTE — Evaluation (Signed)
Physical Therapy Evaluation Patient Details Name: Patricia [name] MRN: [id number] DOB: 11/10/1965 Today's Date: 08/09/2016   History of Present Illness  Patricia [name] is a 50yo white female [facility] Rt [facility]. Pt reports history of infection in the Rt knee and pain starting back in February, which has limited her mobility to primarily household distances with intermittent AD use.   Clinical Impression  Upon entry, the patient is received semirecumbent in bed, daughter present for a few minutes only. The pt is awake and agreeable to participate. Pt SaO2 WNL on RA upon entry, throughout session. The pt is alert and oriented x3, pleasant, conversational, and following simple and multi-step commands consistently. Functional mobility assessment demonstrates mild-moderate impairment, the pt now requiring supervision-minguard for all mobility, as well as mod-max effort to perform, whereas she performs these with modified independence at baseline. The patient demonstrates some pallor and dizziness mid AMB, and is brought back to a seated position, found to have BP: 92/56. Pt requires minA to perform all HEP. Education provided on precautions and how to don KI.   Patient presenting with impairment of strength, range of motion, balance, and activity tolerance, limiting ability to perform ADL and mobility tasks at  baseline level of function. Patient will benefit from skilled intervention to address the above impairments and limitations, in order to restore to prior level of function, improve patient safety upon discharge, and to decrease falls risk.       Follow Up Recommendations Home health PT;Supervision - Intermittent    Equipment Recommendations  None recommended by PT    Recommendations for Other Services       Precautions / Restrictions Precautions Precautions: Fall;Knee Precaution Booklet Issued: No Required Braces or Orthoses: Knee Immobilizer -  Right Restrictions Weight Bearing Restrictions: Yes RLE Weight Bearing: Weight bearing as tolerated      Mobility  Bed Mobility Overal bed mobility: Modified Independent                Transfers Overall transfer level: Needs assistance Equipment used: Rolling walker (2 wheeled) (Rt KI) Transfers: Sit to/from Stand Sit to Stand: Supervision         General transfer comment: Verbal cues for safe AD use, safe stransfers technique  Ambulation/Gait Ambulation/Gait assistance: Min guard Ambulation Distance (Feet): 25 Feet Assistive device: Rolling walker (2 wheeled) (Rt KI)     Gait velocity interpretation: <1.8 ft/sec, indicative of risk for recurrent falls General Gait Details: Pt will pallor once into the hallway, c/o dizziness, slow progression back to chair, BP:92/56 once seated.   Stairs            Wheelchair Mobility    Modified Rankin (Stroke Patients Only)       Balance                                             Pertinent Vitals/Pain Pain Assessment: 0-10 Pain Score: 7  Pain Location: R knee  Pain Descriptors / Indicators: Burning Pain Intervention(s): Limited activity within patient's tolerance;Monitored during session;Premedicated before session;Repositioned    Home Living Family/patient expects to be discharged to:: Private residence Living Arrangements: Children Available Help at Discharge: Family Type of Home: House Home Access: Level entry     Home Layout: One level Home Equipment: Crutches;Walker - 2 wheels;Walker - 4 wheels      Prior Function  Level of Independence: Independent with assistive device(s)         Comments: Has been working still with RW for assistance     Hand Dominance        Extremity/Trunk Assessment                Communication   Communication: No difficulties  Cognition Arousal/Alertness: Awake/alert Behavior During Therapy: WFL for tasks assessed/performed Overall  Cognitive Status: Within Functional Limits for tasks assessed                      General Comments      Exercises Total Joint Exercises Ankle Circles/Pumps: AROM;Both;15 reps Quad Sets: AROM;Right;10 reps Short Arc QuadBarbaraann [name]: AAROM;Right;10 reps Heel Slides: AAROM;Right;10 reps Hip ABduction/ADduction: AAROM;Right;10 reps Goniometric ROM: 11-45 degrees Rt knee flexion, limited by burning anterior knee pain near incision site.    Assessment/Plan    PT Assessment Patient needs continued PT services  PT Problem List Decreased strength;Decreased range of motion;Decreased activity tolerance;Decreased balance;Decreased mobility;Decreased coordination;Decreased safety awareness;Obesity;Pain          PT Treatment Interventions DME instruction;Gait training;Stair training;Functional mobility training;Therapeutic activities;Therapeutic exercise;Balance training;Patient/family education    PT Goals (Current goals can be found in the Care Plan section)  Acute Rehab PT Goals Patient Stated Goal: Return to indep AMB s AD.  PT Goal Formulation: With patient Time For Goal Achievement: 08/23/16 Potential to Achieve Goals: Fair    Frequency BID   Barriers to discharge        Co-evaluation               End of Session Equipment Utilized During Treatment: Gait belt;Right knee immobilizer Activity Tolerance: Patient limited by lethargy;Treatment limited secondary to medical complications (Comment);Patient limited by fatigue Patient left: in chair;with nursing/sitter in room;with call bell/phone within reach Nurse Communication: Mobility status;Other (comment) (pallor, hypotension)         Time: 7829-56210943-1030 PT Time Calculation (min) (ACUTE ONLY): 47 min   Charges:   PT Evaluation $PT Eval Low Complexity: 1 Procedure PT Treatments $Therapeutic Exercise: 8-22 mins $Therapeutic Activity: 8-22 mins   PT G Codes:        10:53 AM, 08/09/16 Rosamaria LintsAllan C Maren Wiesen, PT,  DPT Physical Therapist - [place] [phone](ASCOM)  [phone] (mobile)

## 2016-08-09 NOTE — Clinical Social Work Note (Signed)
CSW received consult for possible SNF placement for STR. The CSW will follow pending PT recommendations.  Argentina PonderKaren Martha Aundria Bitterman, MSW, LCSW-A [phone]

## 2016-08-09 NOTE — Anesthesia Postprocedure Evaluation (Signed)
Anesthesia Post Note  Patient: Patricia [name]  Procedure(s) Performed: Procedure(s) (LRB): TOTAL KNEE ARTHROPLASTY (Right)  Patient location during evaluation: Nursing Unit Anesthesia Type: Spinal Level of consciousness: oriented and awake and alert Pain management: pain level controlled Vital Signs Assessment: post-procedure vital signs reviewed and stable Respiratory status: spontaneous breathing, respiratory function stable and patient connected to nasal cannula oxygen Cardiovascular status: blood pressure returned to baseline and stable Postop Assessment: no headache, no backache and patient able to bend at knees Anesthetic complications: no    Last Vitals:  Vitals:   08/09/16 0716 08/09/16 0745  BP: (!) 87/53 112/72  Pulse: 97   Resp: 18   Temp: 37.5 C     Last Pain:  Vitals:   08/09/16 0716  TempSrc: Oral  PainSc:                  Cleda MccreedyJoseph K Piscitello

## 2016-08-09 NOTE — Progress Notes (Addendum)
Pt noted with vomiting after PT session. PRN zofran given IV with little effect. Pt states she has no appetite. BG have been low this shift, last check was 74. BP low, 90/64, temp noted at 101.3 orally. Tylenol given per orders. Pt complains of pain to operative knee, and feeling unwell. Prime Doc paged. Orders for blood cultures and urinalysis per Dr. Judithann SheenSparks.

## 2016-08-09 NOTE — Progress Notes (Signed)
Physical Therapy Treatment Patient Details Name: Patricia [name] MRN: [id number] DOB: 04/19/1966 Today's Date: 08/09/2016    History of Present Illness Patricia [name] is a 50yo white female who [facility] Rt [facility]. Pt reports history of infection in the Rt knee and pain starting back in February, which has limited her mobility to primarily household distances with intermittent AD use.     PT Comments    HEP reviewed this session, now with handout for visual cues for education. Pt tolerating improved Knee flexion ROM, but extension is more limited. Orthostatic vitals stable from supine to sitting, but dropping upon standing x 1 minute, with nausea and emesis. Pt education included to promote use of bone-foam heel elevation for TKE ROM. Hansout left in room and explain to patient that next session will include more education on AAROM HEP with self assist via towel or bed sheet. No changes to POC at this time. Pt's two daughters are both CNAs and ready to assist the patient once she DC and returns to home.    Follow Up Recommendations  Home health PT;Supervision - Intermittent     Equipment Recommendations  None recommended by PT    Recommendations for Other Services       Precautions / Restrictions Precautions Precautions: Fall;Knee Precaution Booklet Issued: Yes (comment) Required Braces or Orthoses: Knee Immobilizer - Right Restrictions Weight Bearing Restrictions: Yes RLE Weight Bearing: Weight bearing as tolerated    Mobility  Bed Mobility Overal bed mobility: Needs Assistance Bed Mobility: Supine to Sit;Sit to Supine     Supine to sit: Min assist Sit to supine: Min assist   General bed mobility comments: demonstrated leg hook with mod success.   Transfers Overall transfer level: Needs assistance Equipment used: Rolling walker (2 wheeled) Transfers: Sit to/from Stand Sit to Stand: From elevated surface;Min assist         General transfer comment:  verbal cues for safe transfer technique and RLE positioning  Ambulation/Gait Ambulation/Gait assistance:  (deferred at this time; pt remains orthostatic, nauseated upon standing, with emesis x3 after return to bed. )               Stairs            Wheelchair Mobility    Modified Rankin (Stroke Patients Only)       Balance                                    Cognition Arousal/Alertness: Awake/alert Behavior During Therapy: WFL for tasks assessed/performed Overall Cognitive Status: Within Functional Limits for tasks assessed                      Exercises Total Joint Exercises Ankle Circles/Pumps: AROM;Both;15 reps Quad Sets: AROM;Right;10 reps Short Arc QuadBarbaraann [name]: AAROM;Right;10 reps Heel Slides: AAROM;Right;10 reps Hip ABduction/ADduction: AAROM;Right;10 reps Goniometric ROM: 22-75 degrees Rt knee flexion;     General Comments        Pertinent Vitals/Pain Pain Assessment: 0-10 Pain Score: 6  Pain Location: R knee  (Also, Rt quads pain "bruised") Pain Descriptors / Indicators: Burning Pain Intervention(s): Limited activity within patient's tolerance;Monitored during session;Premedicated before session;Repositioned;PCA encouraged;Ice applied    Home Living                      Prior Function  PT Goals (current goals can now be found in the care plan section) Acute Rehab PT Goals Patient Stated Goal: return home  PT Goal Formulation: With patient Time For Goal Achievement: 08/23/16 Potential to Achieve Goals: Fair Progress towards PT goals: Not progressing toward goals - comment (remains limited by vitals and N/V )    Frequency    BID      PT Plan Current plan remains appropriate    Co-evaluation             End of Session Equipment Utilized During Treatment: Right knee immobilizer Activity Tolerance: Patient limited by lethargy;Treatment limited secondary to medical complications (Comment)  (limited by othostatic vitals and emesis. ) Patient left: in bed;with nursing/sitter in room;with call bell/phone within reach;with SCD's reapplied (bed alarm not working)     Time: [id number] PT Time Calculation (min) (ACUTE ONLY): 45 min  Charges:  $Therapeutic Exercise: 23-37 mins $Therapeutic Activity: 8-22 mins                    G Codes:      4:17 PM, 08/09/16 Patricia LintsAllan C [name], PT, DPT Physical Therapist - [place] [phone](ASCOM)  [phone] (mobile)

## 2016-08-09 NOTE — Progress Notes (Signed)
Notified PA of lab results, Potassium 5.6 this am. Hold lisinopril this am, will check with MD and continue to monitor. Manual BP checked at 0745, 112/72.

## 2016-08-09 NOTE — Progress Notes (Signed)
   Subjective: 1 Day Post-Op Procedure(s) (LRB): TOTAL KNEE ARTHROPLASTY (Right) Patient reports pain as moderate.   Patient is well, and has had no acute complaints or problems Denies any CP/palpitations, SOB, ABD pain, fatigue, weakness, muscle cramps. We will start therapy today.    Objective: Vital signs in last 24 hours: Temp:  [97 F (36.1 C)-100.9 F (38.3 C)] 99.5 F (37.5 C) (12/16 0716) Pulse Rate:  [72-99] 97 (12/16 0716) Resp:  [10-20] 18 (12/16 0716) BP: (87-116)/(51-72) 112/72 (12/16 0745) SpO2:  [91 %-100 %] 91 % (12/16 0716) FiO2 (%):  [28 %] 28 % (12/15 1438)  Intake/Output from previous day: 12/15 0701 - 12/16 0700 In: 1400 [I.V.:1350; IV Piggyback:50] Out: 525 [Urine:375; Blood:150] Intake/Output this shift: No intake/output data recorded.   Recent Labs  08/08/16 1459 08/09/16 0323  HGB 9.2* 8.5*    Recent Labs  08/08/16 1459 08/09/16 0323  WBC 8.0 8.3  RBC 3.40* 3.13*  HCT 27.4* 25.5*  PLT 341 340    Recent Labs  08/08/16 1459 08/09/16 0323  NA  --  136  K  --  5.6*  CL  --  108  CO2  --  25  BUN  --  22*  CREATININE 0.86 1.05*  GLUCOSE  --  161*  CALCIUM  --  8.7*   No results for input(s): LABPT, INR in the last 72 hours.  EXAM General - Patient is Alert, Appropriate and Oriented Extremity - Neurovascular intact Sensation intact distally Intact pulses distally Dorsiflexion/Plantar flexion intact No cellulitis present Compartment soft  Unable to straight leg raise Dressing - dressing C/D/I and no drainage Motor Function - intact, moving foot and toes well on exam.   Past Medical History:  Diagnosis Date  . Anemia   . Arthritis   . Complication of anesthesia 12/13/2015   aspiration after breathing tube removed after several surgeries  . Depression   . Diabetes mellitus without complication (HCC)   . GERD (gastroesophageal reflux disease)   . Hyperlipidemia   . Hypertension   . Infection 2012   right knee  infection, required arthroscopy, long term antibiotic treatment, aspiration therapy    Assessment/Plan:   1 Day Post-Op Procedure(s) (LRB): TOTAL KNEE ARTHROPLASTY (Right) Active Problems:   Primary osteoarthritis of right knee   Acute post op blood loss anemia    Hyperkalemia  Estimated body mass index is 32.28 kg/m as calculated from the following:   Height as of this encounter: 5\' 6"  (1.676 m).   Weight as of this encounter: 90.7 kg (200 lb). Advance diet Up with therapy  Needs BM Encouraged incentive spirometer CM to assist with discharge  Acute post op blood loss anemia - Hgb 8.5, add Fe supplement, recheck labs in the am  Hyperkalemia Mild- K 5.6. Check Mg. Will hold K sparring diuretic (BP running low), continue with IV fluids/Insulin and recheck K this afternoon.   DVT Prophylaxis - Lovenox, Foot Pumps and TED hose Weight-Bearing as tolerated to right leg   T. Cranston Neighborhris Gaines, PA-C [facility] 08/09/2016, 9:13 AM

## 2016-08-09 NOTE — Evaluation (Signed)
Occupational Therapy Evaluation Patient Details Name: Patricia [name] MRN: [id number] DOB: 05-25-1966 Today's Date: 08/09/2016    History of Present Illness Patricia [name] is a 50yo white female who comes to [facility] for elective Rt TKA. Pt reports history of infection in the Rt knee and pain starting back in February, which has limited her mobility to primarily household distances with intermittent AD use.    Clinical Impression   Pt 50yo frmale s/p R TKA POD#. Prior to surgery, pt was working as an Armed forces training and education officer at an [employer] but has not driven since February due to her R knee. Pt has good family support at home with no concern that her needs will be met at home. Pt reports having a fairly significant step up into walk in shower at home but reports that HHPT will address this. At baseline, pt required some assistance from family for some tasks such as bathing, homemaking, and driving. Pt presents with with 6/10 pain in R knee and impairments in strength, ROM, balance, and activity tolerance limiting pt's ability to perform ADL and mobility tasks at baseline level.  Patient will benefit from skilled OT intervention to address the above impairments and limitations, in order to restore to prior level of function, improve patient safety upon discharge, and to minimize falls risk.    Follow Up Recommendations  Supervision - Intermittent;No OT follow up    Equipment Recommendations  3 in 1 bedside commode    Recommendations for Other Services       Precautions / Restrictions Precautions Precautions: Fall;Knee Precaution Booklet Issued: No Required Braces or Orthoses: Knee Immobilizer - Right Restrictions Weight Bearing Restrictions: Yes RLE Weight Bearing: Weight bearing as tolerated      Mobility Bed Mobility Overal bed mobility: Modified Independent                Transfers Overall transfer level: Needs assistance Equipment used: Rolling walker (2  wheeled) Transfers: Sit to/from Stand Sit to Stand: Supervision         General transfer comment: verbal cues for safe transfer technique and RLE positioning    Balance Overall balance assessment: Needs assistance   Sitting balance-Leahy Scale: Normal     Standing balance support: Single extremity supported Standing balance-Leahy Scale: Fair                              ADL Overall ADL's : Needs assistance/impaired Eating/Feeding: Set up;Sitting   Grooming: Set up;Sitting   Upper Body Bathing: Set up;Minimal assistance Upper Body Bathing Details (indicate cue type and reason): Min assist to wash back after set up while seated Lower Body Bathing: Moderate assistance;Sit to/from stand;Set up;Sitting/lateral leans Lower Body Bathing Details (indicate cue type and reason): Pt able to wash tops of legs and front torso after set up, required assistance to wash perineal area while standing with RW Upper Body Dressing : Set up   Lower Body Dressing: Moderate assistance;Sit to/from stand Lower Body Dressing Details (indicate cue type and reason): Pt requires assistance for donning LB dressing over feet Toilet Transfer: Min guard;BSC;Stand-pivot;RW Toilet Transfer Details (indicate cue type and reason): clinical judgment that pt would require min guard to perform stand/pivot transfer from bedside or chair with RW to [facility]. Did not attempt transfer to bathroom due to pain and fatigue following bathing tasks         Functional mobility during ADLs: Min guard General ADL Comments: At baseline,  pt was requiring occasional assistance for bathing, dressing tasks from daughters who were able to support her     Vision Vision Assessment?: No apparent visual deficits   Perception     Praxis      Pertinent Vitals/Pain Pain Assessment: 0-10 Pain Score: 6  Pain Location: R knee  Pain Descriptors / Indicators: Burning Pain Intervention(s): Limited activity within patient's  tolerance;Monitored during session;Repositioned (polar care reapplied at end of session)     Hand Dominance     Extremity/Trunk Assessment Upper Extremity Assessment Upper Extremity Assessment: Overall WFL for tasks assessed   Lower Extremity Assessment Lower Extremity Assessment: Defer to PT evaluation       Communication Communication Communication: No difficulties   Cognition Arousal/Alertness: Awake/alert Behavior During Therapy: WFL for tasks assessed/performed Overall Cognitive Status: Within Functional Limits for tasks assessed                     General Comments       Exercises       Shoulder Instructions      Home Living Family/patient expects to be discharged to:: Private residence Living Arrangements: Children (adult dtr lives with pt.) Available Help at Discharge: Available PRN/intermittently;Family (2 adult dtrs, both CNAs, available to assist intermittently) Type of Home: House Home Access: Other (comment) (half step to enter doorway)     Home Layout: One level     Bathroom Shower/Tub: Walk-in shower;Door   Bathroom Toilet: Handicapped height     Home Equipment: Crutches;Walker - 2 wheels;Walker - 4 wheels          Prior Functioning/Environment Level of Independence: Independent with assistive device(s)        Comments: Has been working still with RW for assistance        OT Problem List: Decreased strength;Decreased activity tolerance;Decreased range of motion;Decreased knowledge of use of DME or AE;Pain   OT Treatment/Interventions: Self-care/ADL training;DME and/or AE instruction;Energy conservation;Patient/family education    OT Goals(Current goals can be found in the care plan section) Acute Rehab OT Goals Patient Stated Goal: return home  OT Goal Formulation: With patient Time For Goal Achievement: 08/23/16 Potential to Achieve Goals: Good  OT Frequency: Min 1X/week   Barriers to D/C:            Co-evaluation               End of Session Equipment Utilized During Treatment: Surveyor, mining Communication: Other (comment)  Activity Tolerance: Patient limited by pain Patient left: in chair;with call bell/phone within reach;Other (comment) (polar care reapplied)   Time: 8016-5537 OT Time Calculation (min): 35 min Charges:  OT General Charges $OT Visit: 1 Procedure OT Evaluation $OT Eval Low Complexity: 1 Procedure OT Treatments $Self Care/Home Management : 8-22 mins G-Codes:    Corky Sox, OTR/L 08/09/2016, 11:36 AM

## 2016-08-10 LAB — CBC
HCT: 23.5 % — ABNORMAL LOW (ref 35.0–47.0)
Hemoglobin: 7.8 g/dL — ABNORMAL LOW (ref 12.0–16.0)
MCH: 26.8 pg (ref 26.0–34.0)
MCHC: 33.3 g/dL (ref 32.0–36.0)
MCV: 80.5 fL (ref 80.0–100.0)
PLATELETS: 290 10*3/uL (ref 150–440)
RBC: 2.92 MIL/uL — ABNORMAL LOW (ref 3.80–5.20)
RDW: 15 % — AB (ref 11.5–14.5)
WBC: 7 10*3/uL (ref 3.6–11.0)

## 2016-08-10 LAB — BASIC METABOLIC PANEL
ANION GAP: 6 (ref 5–15)
BUN: 16 mg/dL (ref 6–20)
CALCIUM: 9.2 mg/dL (ref 8.9–10.3)
CO2: 25 mmol/L (ref 22–32)
CREATININE: 0.93 mg/dL (ref 0.44–1.00)
Chloride: 104 mmol/L (ref 101–111)
GLUCOSE: 65 mg/dL (ref 65–99)
Potassium: 4.2 mmol/L (ref 3.5–5.1)
Sodium: 135 mmol/L (ref 135–145)

## 2016-08-10 LAB — PREPARE RBC (CROSSMATCH)

## 2016-08-10 LAB — HEMOGLOBIN AND HEMATOCRIT, BLOOD
HCT: 25.4 % — ABNORMAL LOW (ref 35.0–47.0)
Hemoglobin: 8.5 g/dL — ABNORMAL LOW (ref 12.0–16.0)

## 2016-08-10 LAB — GLUCOSE, CAPILLARY
GLUCOSE-CAPILLARY: 62 mg/dL — AB (ref 65–99)
GLUCOSE-CAPILLARY: 120 mg/dL — AB (ref 65–99)
GLUCOSE-CAPILLARY: 110 mg/dL — AB (ref 65–99)
GLUCOSE-CAPILLARY: 150 mg/dL — AB (ref 65–99)

## 2016-08-10 MED ORDER — SODIUM CHLORIDE 0.9 % IV SOLN
Freq: Once | INTRAVENOUS | Status: AC
  Administered 2016-08-10: 20:00:00 via INTRAVENOUS

## 2016-08-10 MED ORDER — NITROFURANTOIN MONOHYD MACRO 100 MG PO CAPS
100.0000 mg | ORAL_CAPSULE | Freq: Two times a day (BID) | ORAL | Status: DC
  Administered 2016-08-10 – 2016-08-11 (×3): 100 mg via ORAL
  Filled 2016-08-10 (×3): qty 1

## 2016-08-10 NOTE — Progress Notes (Signed)
Pt afebrile this shift, slight increase in PO intake, sips and bites today. Up to BR, ambulates with assistance and RW. Pain controlled with Oxy 10mg  PRN. Loss of IV access this AM, multiple attempts for IV, unsuccessful. Spoke with Dr. Ernest PineHooten, ortho on call. Consulted with Gen surg, unavailable for IV procedure at this time d/t scheduled and emergency procedures. Order for PICC to expedite iv access and transfuse blood (hgb 7.8). Consent for PICC and blood obtained. Spoek with Dr. Ernest PineHooten regarding pt's hypoglycemia and low appetite. DM meds held this AM, BG this evening 111. Per Dr. Ernest PineHooten, hold metformin and give amaryl this evening, hold Lantus for hypoglycemia. Pt will probably need endocrine follow up. Relayed instructions to oncoming shift. Transfusion continued to oncoming shift with no adverse effects.

## 2016-08-10 NOTE — Progress Notes (Signed)
   Subjective: 2 Days Post-Op Procedure(s) (LRB): TOTAL KNEE ARTHROPLASTY (Right) Patient reports pain as mild.   Patient is orthostatic yesterday with nausea. Today feels queezy. Tolerating po. Denies any CP/palpitations, SOB, ABD pain We will continue with therapy today.    Objective: Vital signs in last 24 hours: Temp:  [98.3 F (36.8 C)-100.8 F (38.2 C)] 98.3 F (36.8 C) (12/17 0723) Pulse Rate:  [91-115] 114 (12/17 0723) Resp:  [18] 18 (12/17 0418) BP: (90-130)/(56-78) 94/68 (12/17 0723) SpO2:  [92 %-98 %] 98 % (12/17 0723)  Intake/Output from previous day: 12/16 0701 - 12/17 0700 In: 340 [P.O.:240; IV Piggyback:100] Out: 1850 [Urine:1850] Intake/Output this shift: No intake/output data recorded.   Recent Labs  08/08/16 1459 08/09/16 0323 08/10/16 0345  HGB 9.2* 8.5* 7.8*    Recent Labs  08/09/16 0323 08/10/16 0345  WBC 8.3 7.0  RBC 3.13* 2.92*  HCT 25.5* 23.5*  PLT 340 290    Recent Labs  08/09/16 0323 08/09/16 1457 08/10/16 0345  NA 136  --  135  K 5.6* 5.0 4.2  CL 108  --  104  CO2 25  --  25  BUN 22*  --  16  CREATININE 1.05*  --  0.93  GLUCOSE 161*  --  65  CALCIUM 8.7*  --  9.2   No results for input(s): LABPT, INR in the last 72 hours.  EXAM General - Patient is Alert, Appropriate and Oriented Extremity - Neurovascular intact Sensation intact distally Intact pulses distally Dorsiflexion/Plantar flexion intact No cellulitis present Compartment soft  Abdomen: soft non tender. Unable to straight leg raise Dressing - dressing C/D/I and wound vac intact with no saturation of spounge. Canister empty, blood noted in wound vac line. Motor Function - intact, moving foot and toes well on exam.   Past Medical History:  Diagnosis Date  . Anemia   . Arthritis   . Complication of anesthesia 12/13/2015   aspiration after breathing tube removed after several surgeries  . Depression   . Diabetes mellitus without complication (HCC)   .  GERD (gastroesophageal reflux disease)   . Hyperlipidemia   . Hypertension   . Infection 2012   right knee infection, required arthroscopy, long term antibiotic treatment, aspiration therapy    Assessment/Plan:   2 Days Post-Op Procedure(s) (LRB): TOTAL KNEE ARTHROPLASTY (Right) Active Problems:   Primary osteoarthritis of right knee   Acute post op blood loss anemia    UTI   Hyperkalemia    Estimated body mass index is 32.28 kg/m as calculated from the following:   Height as of this encounter: 5\' 6"  (1.676 m).   Weight as of this encounter: 90.7 kg (200 lb). Advance diet Up with therapy  Needs BM  Acute post op blood loss anemia - Hgb 7.8. Transfuse 1 unit of PRBC and recheck labs in the am.   Hyperkalemia - resolved. Will continue to monitor  UTI - start macrobid 100mg  BID x 5 days. Culture ordered.   DVT Prophylaxis - Lovenox, Foot Pumps and TED hose Weight-Bearing as tolerated to right leg   T. Cranston Neighborhris Orine Goga, PA-C [facility] 08/10/2016, 9:28 AM

## 2016-08-11 LAB — TYPE AND SCREEN
ABO/RH(D): O POS
ANTIBODY SCREEN: NEGATIVE
Unit division: 0

## 2016-08-11 LAB — BASIC METABOLIC PANEL
Anion gap: 7 (ref 5–15)
BUN: 12 mg/dL (ref 6–20)
CO2: 26 mmol/L (ref 22–32)
Calcium: 9.7 mg/dL (ref 8.9–10.3)
Chloride: 103 mmol/L (ref 101–111)
Creatinine, Ser: 0.96 mg/dL (ref 0.44–1.00)
GFR calc Af Amer: >60 mL/min (ref >60)
GFR calc non Af Amer: >60 mL/min (ref >60)
GLUCOSE: 95 mg/dL (ref 65–99)
POTASSIUM: 4.1 mmol/L (ref 3.5–5.1)
Sodium: 136 mmol/L (ref 135–145)

## 2016-08-11 LAB — CBC
HEMATOCRIT: 25.6 % — AB (ref 35.0–47.0)
Hemoglobin: 8.7 g/dL — ABNORMAL LOW (ref 12.0–16.0)
MCH: 27.1 pg (ref 26.0–34.0)
MCHC: 33.9 g/dL (ref 32.0–36.0)
MCV: 79.9 fL — AB (ref 80.0–100.0)
Platelets: 308 10*3/uL (ref 150–440)
RBC: 3.2 MIL/uL — AB (ref 3.80–5.20)
RDW: 15.7 % — AB (ref 11.5–14.5)
WBC: 8 10*3/uL (ref 3.6–11.0)

## 2016-08-11 LAB — GLUCOSE, CAPILLARY
GLUCOSE-CAPILLARY: 100 mg/dL — AB (ref 65–99)
GLUCOSE-CAPILLARY: 119 mg/dL — AB (ref 65–99)
Glucose-Capillary: 127 mg/dL — ABNORMAL HIGH (ref 65–99)
Glucose-Capillary: 143 mg/dL — ABNORMAL HIGH (ref 65–99)

## 2016-08-11 LAB — URINE CULTURE: Culture: NO GROWTH

## 2016-08-11 MED ORDER — SODIUM CHLORIDE 0.9 % IV BOLUS (SEPSIS)
500.0000 mL | Freq: Once | INTRAVENOUS | Status: AC
  Administered 2016-08-11: 500 mL via INTRAVENOUS

## 2016-08-11 MED ORDER — ENOXAPARIN SODIUM 40 MG/0.4ML ~~LOC~~ SOLN: 40.0000 mg | SUBCUTANEOUS | 0 refills | Status: DC

## 2016-08-11 MED ORDER — OXYCODONE HCL 5 MG PO TABS: 5.0000 mg | ORAL_TABLET | ORAL | 0 refills | Status: DC | PRN

## 2016-08-11 MED ORDER — NITROFURANTOIN MONOHYD MACRO 100 MG PO CAPS: 100.0000 mg | ORAL_CAPSULE | Freq: Two times a day (BID) | ORAL | 0 refills | Status: DC

## 2016-08-11 NOTE — Progress Notes (Signed)
[place] INFECTIOUS DISEASE PROGRESS NOTE Date of Admission:  08/08/2016     ID: Patricia [name] is a 50 y.o. female with prior joint infection  Active Problems:   Primary osteoarthritis of right knee   Subjective: Low grade fever, UA + so started macrobid. Is up walking with PT.  Remains on ancef  ROS  Eleven systems are reviewed and negative except per hpi  Medications:  Antibiotics Given (last 72 hours)    Date/Time Action Medication Dose Rate   08/08/16 1618 Given   ceFAZolin (ANCEF) IVPB 2g/100 mL premix 2 g 200 mL/hr   08/08/16 2201 Given   ceFAZolin (ANCEF) IVPB 2g/100 mL premix 2 g 200 mL/hr   08/09/16 0309 Given   ceFAZolin (ANCEF) IVPB 2g/100 mL premix 2 g 200 mL/hr   08/09/16 1209 Given   ceFAZolin (ANCEF) IVPB 2g/100 mL premix 2 g 200 mL/hr   08/09/16 2112 Given   ceFAZolin (ANCEF) IVPB 2g/100 mL premix 2 g 200 mL/hr   08/10/16 0115 Given   ceFAZolin (ANCEF) IVPB 2g/100 mL premix 2 g 200 mL/hr   08/10/16 1012 Given   ceFAZolin (ANCEF) IVPB 2g/100 mL premix 2 g 200 mL/hr   08/10/16 1012 Given   nitrofurantoin (macrocrystal-monohydrate) (MACROBID) capsule 100 mg 100 mg    08/10/16 1931 Given   ceFAZolin (ANCEF) IVPB 2g/100 mL premix 2 g 200 mL/hr   08/10/16 2140 Given   nitrofurantoin (macrocrystal-monohydrate) (MACROBID) capsule 100 mg 100 mg    08/11/16 0058 Given   ceFAZolin (ANCEF) IVPB 2g/100 mL premix 2 g 200 mL/hr   08/11/16 0743 Given   ceFAZolin (ANCEF) IVPB 2g/100 mL premix 2 g 200 mL/hr   08/11/16 0748 Given   nitrofurantoin (macrocrystal-monohydrate) (MACROBID) capsule 100 mg 100 mg    08/11/16 1348 Given   ceFAZolin (ANCEF) IVPB 2g/100 mL premix 2 g 200 mL/hr     . aspirin EC  81 mg Oral Daily  . atorvastatin  10 mg Oral q1800  .  ceFAZolin (ANCEF) IV  2 g Intravenous Q6H  . dextrose  1 Tube Oral Once  . docusate sodium  100 mg Oral BID  . enoxaparin (LOVENOX) injection  30 mg Subcutaneous Q12H  . famotidine  10 mg Oral BID  .  ferrous [id number] C-folic acid  1 capsule Oral Daily  . FLUoxetine  40 mg Oral Daily  . glimepiride  4 mg Oral BID WC  . insulin aspart  0-15 Units Subcutaneous TID WC  . insulin glargine  40 Units Subcutaneous QHS  . lisinopril  20 mg Oral Daily  . metFORMIN  1,000 mg Oral BID WC  . metoprolol tartrate  25 mg Oral BID  . nitrofurantoin (macrocrystal-monohydrate)  100 mg Oral Q12H    Objective: Vital signs in last 24 hours: Temp:  [99.2 F (37.3 C)-99.9 F (37.7 C)] 99.7 F (37.6 C) (12/18 1517) Pulse Rate:  [85-120] 96 (12/18 1518) Resp:  [16-18] 17 (12/18 1517) BP: (93-162)/(51-81) 104/51 (12/18 1518) SpO2:  [86 %-100 %] 95 % (12/18 1518) Constitutional:  oriented to person, place, and time. appears well-developed and well-nourished. No distress.  HENT: [place]/AT, PERRLA, no scleral icterus Mouth/Throat: Oropharynx is clear and moist. No oropharyngeal exudate.  Cardiovascular: Normal rate, regular rhythm and normal heart sounds.  Pulmonary/Chest: Effort normal and breath sounds normal. No respiratory distress.  has no wheezes.  Neck  supple, no nuchal rigidity Abdominal: Soft. Bowel sounds are normal.  exhibits no distension. There is no tenderness.  Lymphadenopathy:  no cervical adenopathy. No axillary adenopathy Neurological: alert and oriented to person, place, and time.  Skin: Skin is warm and dry. No rash noted. No erythema.  Psychiatric: a normal mood and affect.  behavior is normal.  Ext R knee wrapped post op  Lab Results  Recent Labs  08/10/16 0345 08/10/16 2040 08/11/16 0320  WBC 7.0  --  8.0  HGB 7.8* 8.5* 8.7*  HCT 23.5* 25.4* 25.6*  NA 135  --  136  K 4.2  --  4.1  CL 104  --  103  CO2 25  --  26  BUN 16  --  12  CREATININE 0.93  --  0.96    Microbiology: Results for orders placed or performed during the hospital encounter of 08/08/16  Aerobic/Anaerobic Culture (surgical/deep wound)     Status: None (Preliminary result)   Collection Time:  08/08/16 10:51 AM  Result Value Ref Range Status   Specimen Description KNEE RIGHT  Final   Special Requests SPEC A ON SWAB  Final   Gram Stain   Final    FEW WBC PRESENT, PREDOMINANTLY PMN NO ORGANISMS SEEN    Culture   Final    NO GROWTH 2 DAYS NO ANAEROBES ISOLATED; CULTURE IN PROGRESS FOR 5 DAYS Performed at [facility]    Report Status PENDING  Incomplete  Aerobic/Anaerobic Culture (surgical/deep wound)     Status: None (Preliminary result)   Collection Time: 08/08/16 10:51 AM  Result Value Ref Range Status   Specimen Description KNEE RIGHT  Final   Special Requests SPEC B ON SWAB  Final   Gram Stain NO WBC SEEN NO ORGANISMS SEEN   Final   Culture   Final    NO GROWTH 2 DAYS NO ANAEROBES ISOLATED; CULTURE IN PROGRESS FOR 5 DAYS Performed at [facility]    Report Status PENDING  Incomplete  CULTURE, BLOOD (ROUTINE X 2) w Reflex to ID Panel     Status: None (Preliminary result)   Collection Time: 08/09/16  7:10 PM  Result Value Ref Range Status   Specimen Description BLOOD LEFT HAND  Final   Special Requests BOTTLES DRAWN AEROBIC AND ANAEROBIC [id number],[id number]  Final   Culture NO GROWTH 2 DAYS  Final   Report Status PENDING  Incomplete  CULTURE, BLOOD (ROUTINE X 2) w Reflex to ID Panel     Status: None (Preliminary result)   Collection Time: 08/09/16  7:30 PM  Result Value Ref Range Status   Specimen Description BLOOD RIGHT ASSIST CONTROL  Final   Special Requests BOTTLES DRAWN AEROBIC AND ANAEROBIC [place]  Final   Culture NO GROWTH 2 DAYS  Final   Report Status PENDING  Incomplete  Urine culture     Status: None   Collection Time: 08/09/16  8:25 PM  Result Value Ref Range Status   Specimen Description URINE, RANDOM  Final   Special Requests NONE  Final   Culture NO GROWTH Performed at [facility]   Final   Report Status 08/11/2016 FINAL  Final    Studies/Results: No results found.  Assessment/Plan: Patricia [name] is a 50 y.o.  female with prior hx in April 2017 of Strep mitis R septic knee in setting of OA and recent steroid then synvisc injection.  She was treated with IV ceftriaxone and then oral amoxicillin. She had been off abx for some time and underwent TKR by Dr Rudene Christians. Most reecent CRP was 3.1 on 10/20 down from 79.9 in  April and ESR 21 down form 85.  Post doing well but had low grade fevers, received transfusion.  Recommendations Cx from OR neg- no need for IV abx or picc line Cont macrobid for UTI  Thank you very much for the consult. Will follow with you.  Wanda Cellucci P   08/11/2016, 3:42 PM

## 2016-08-11 NOTE — Discharge Summary (Signed)
Physician Discharge Summary  Patient ID: Patricia [name] MRN: [id number] DOB/AGE: 52/21/1967 52 y.o.  Admit date: 08/08/2016 Discharge date: 08/11/2016  Admission Diagnoses:  PRIMARY OSTEOARTHRITIS RIGHT KNEE   Discharge Diagnoses: Patient Active Problem List   Diagnosis Date Noted  . Primary osteoarthritis of right knee 08/08/2016  . Hyperlipidemia due to type 2 diabetes mellitus ([place]) 01/25/2016  . Septic joint of right knee joint ([place]) 12/13/2015  . Septic arthritis of knee, right ([place]) 11/30/2015  . Hypertension 09/05/2015  . BPPV (benign paroxysmal positional vertigo) 09/05/2015  . Insomnia 09/05/2015  . 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 polyneuropathy, without long-term current use of insulin ([place]) 09/05/2015    Past Medical History:  Diagnosis Date  . Anemia   . Arthritis   . Complication of anesthesia 12/13/2015   aspiration after breathing tube removed after several surgeries  . Depression   . Diabetes mellitus without complication ([place])   . GERD (gastroesophageal reflux disease)   . Hyperlipidemia   . Hypertension   . Infection 2012   right knee infection, required arthroscopy, long term antibiotic treatment, aspiration therapy     Transfusion: 1 unit PRBC 08/10/16   Consultants (if any): Treatment Team:  Leonel Ramsay, MD  Discharged Condition: Improved  Hospital Course: Patricia [name] is an 50 y.o. female who was admitted 08/08/2016 with a diagnosis of <principal problem not specified> and went to the operating room on 08/08/2016 and underwent the above named procedures.    Surgeries: Procedure(s): TOTAL KNEE ARTHROPLASTY on 08/08/2016 Patient tolerated the surgery well. Taken to PACU where she was stabilized and then transferred to the orthopedic floor.  Started on Lovenox 30 q 12 hrs. Foot pumps applied bilaterally at 80 mm. Heels elevated on bed with rolled towels. No evidence of DVT. Negative [place]. Physical therapy  started on day #1 for gait training and transfer. OT started day #1 for ADL and assisted devices.  Patient's foley was d/c on day #1. Patient's IV and hemovac was d/c on day #2.  On post op day #3 patient was stable and ready for discharge to home with HHPT.  Implants: Medacta GMK sphere 3 femur right 2 tibia and 10 mm insert short stem and to patella all components cemented with antibiotic cement plus stimulan beads with vancomycin  She was given perioperative antibiotics:  Anti-infectives    Start     Dose/Rate Route Frequency Ordered Stop   08/08/16 1600  ceFAZolin (ANCEF) IVPB 2g/100 mL premix     2 g 200 mL/hr over 30 Minutes Intravenous Every 6 hours 08/08/16 1447 08/12/16 1959   08/08/16 1213  vancomycin (VANCOCIN) powder  Status:  Discontinued       As needed 08/08/16 1216 08/08/16 1243   08/08/16 0215  ceFAZolin (ANCEF) IVPB 2g/100 mL premix     2 g 200 mL/hr over 30 Minutes Intravenous  Once 08/08/16 0205 08/08/16 1042    .  She was given sequential compression devices, early ambulation, and lovenox for DVT prophylaxis.  She benefited maximally from the hospital stay and there were no complications.    Recent vital signs:  Vitals:   08/11/16 0721 08/11/16 1011  BP: 128/77   Pulse: (!) 118 93  Resp: 16   Temp: 99.5 F (37.5 C)     Recent laboratory studies:  Lab Results  Component Value Date   HGB 8.7 (L) 08/11/2016   HGB 8.5 (L) 08/10/2016   HGB 7.8 (L) 08/10/2016   Lab Results  Component Value Date   WBC 8.0 08/11/2016   PLT 308 08/11/2016   Lab Results  Component Value Date   INR 0.90 07/23/2016   Lab Results  Component Value Date   NA 136 08/11/2016   K 4.1 08/11/2016   CL 103 08/11/2016   CO2 26 08/11/2016   BUN 12 08/11/2016   CREATININE 0.96 08/11/2016   GLUCOSE 95 08/11/2016    Discharge Medications:   Allergies as of 08/11/2016   No Known Allergies     Medication List    TAKE these medications   aspirin EC 81 MG tablet Take 81  mg by mouth daily.   atorvastatin 10 MG tablet Commonly known as:  LIPITOR Take 1 tablet (10 mg total) by mouth daily.   blood glucose meter kit and supplies Kit Dispense based on patient and insurance preference. Use up to four times daily as directed. (FOR ICD-9 250.00, 250.01).   celecoxib 200 MG capsule Commonly known as:  CELEBREX Take 200 mg by mouth daily as needed (for pain.).   enoxaparin 40 MG/0.4ML injection Commonly known as:  LOVENOX Inject 0.4 mLs (40 mg total) into the skin daily.   FLUoxetine 40 MG capsule Commonly known as:  PROZAC TAKE ONE CAPSULE BY MOUTH ONCE DAILY *PATIENT NEEDS TO MAKE APPOINTMENT*   glimepiride 4 MG tablet Commonly known as:  AMARYL Take 2 tablets (8 mg total) by mouth daily with breakfast. What changed:  how much to take  when to take this   Insulin Glargine 300 UNIT/ML Sopn Commonly known as:  TOUJEO SOLOSTAR Inject 40 Units into the skin at bedtime. What changed:  how much to take  when to take this  additional instructions   lisinopril 20 MG tablet Commonly known as:  PRINIVIL,ZESTRIL Take 1 tablet (20 mg total) by mouth daily.   metFORMIN 1000 MG tablet Commonly known as:  GLUCOPHAGE Take 1 tablet (1,000 mg total) by mouth 2 (two) times daily with a meal.   metoprolol tartrate 25 MG tablet Commonly known as:  LOPRESSOR TAKE ONE TABLET BY MOUTH TWICE DAILY   nitrofurantoin (macrocrystal-monohydrate) 100 MG capsule Commonly known as:  MACROBID Take 1 capsule (100 mg total) by mouth every 12 (twelve) hours.   ONE TOUCH ULTRA TEST test strip Generic drug:  glucose blood CHECK BLOOD GLUCOSE UP TO FOUR TIMES DAILY AS DIRECTED   ONETOUCH DELICA LANCETS 57Q Misc USE TO CHECK BLOOD GLUCOSE UP TO FOUR TIMES DAILY AS DIRECTED   oxyCODONE 5 MG immediate release tablet Commonly known as:  Oxy IR/ROXICODONE Take 1-2 tablets (5-10 mg total) by mouth every 3 (three) hours as needed for breakthrough pain.   RELION PEN  NEEDLES 29G X 12MM Misc Generic drug:  Insulin Pen Needle USE AS DIRECTED DAILY   TH FAMOTIDINE 10 10 MG tablet Generic drug:  famotidine Take 10 mg by mouth 2 (two) times daily.   traMADol 50 MG tablet Commonly known as:  ULTRAM Take 50 mg by mouth every 6 (six) hours as needed. For pain.   TYLENOL 8 HOUR ARTHRITIS PAIN 650 MG CR tablet Generic drug:  acetaminophen Take 650 mg by mouth every 8 (eight) hours as needed for pain.            Durable Medical Equipment        Start     Ordered   08/08/16 1448  DME Walker rolling  Once    Question:  Patient needs a walker to treat with the following  condition  Answer:  Status post total knee replacement, right   08/08/16 1447   08/08/16 1448  DME 3 n 1  Once     08/08/16 1447   08/08/16 1448  DME Bedside commode  Once    Question:  Patient needs a bedside commode to treat with the following condition  Answer:  Status post total knee replacement using cement, right   08/08/16 1447      Diagnostic Studies: Dg Knee 1-2 Views Right  Result Date: 08/08/2016 CLINICAL DATA:  Postop right knee replacement EXAM: RIGHT KNEE - 1-2 VIEW COMPARISON:  Right knee CT 07/03/2016 FINDINGS: Status post right total knee arthroplasty with well seated femoral and tibial components. No periprosthetic fracture or other focal hardware abnormality. There is expected postoperative soft tissue gas. Numerous antibiotic beads are noted. IMPRESSION: Well seated right total knee arthroplasty components without focal hardware abnormality. Electronically Signed   By: Ulyses Jarred M.D.   On: 08/08/2016 14:01    Disposition: 01-Home or [place], MD Follow up in 2 week(s).   Specialty:  Orthopedic Surgery Contact information: [place] [id number] [phone]            Signed: Dorise Hiss [facility] 08/11/2016, 1:08 PM

## 2016-08-11 NOTE — Progress Notes (Signed)
Patient HGB has improved since transfusion. Patient refused Lantus insulin related to having low blood sugars. Mild fever on this shift. Refused bone foam and educated. Foot pumps on. Heels elevated.

## 2016-08-11 NOTE — Progress Notes (Signed)
Clinical Social Worker (CSW) received SNF consult. PT is recommending home health. Please reconsult if future social work needs arise. CSW signing off.   Nioka Thorington, LCSW [phone] 

## 2016-08-11 NOTE — Progress Notes (Signed)
Physical Therapy Treatment Patient Details Name: Patricia [name] MRN: [id number] DOB: 08/01/1966 Today's Date: 08/11/2016    History of Present Illness Carloyn Jaegerhyllis Hennes is a 50yo white female who [facility] Rt [facility]KA. Pt reports history of infection in the Rt knee and pain starting back in February, which has limited her mobility to primarily household distances with intermittent AD use.     PT Comments    Pt agreeable to PT. Pain 4/10 right knee. Progressing well with all mobility and range. Pt education regarding all stretching and strengthening and carry over at home. Pt educated on open versus closed knee position for swelling management. Pt received up in chair comfortably. Continue PT to progress all areas to improve functional mobility and allow for optimal return home.   Follow Up Recommendations  Home health PT;Supervision - Intermittent     Equipment Recommendations  None recommended by PT    Recommendations for Other Services       Precautions / Restrictions Precautions Precautions: Fall;Knee Required Braces or Orthoses: Knee Immobilizer - Right Knee Immobilizer - Right: On when out of bed or walking Restrictions Weight Bearing Restrictions: Yes RLE Weight Bearing: Weight bearing as tolerated    Mobility  Bed Mobility Overal bed mobility: Modified Independent Bed Mobility: Supine to Sit     Supine to sit: Modified independent (Device/Increase time)     General bed mobility comments: Use of rail; increased time. Self assists RLE  Transfers Overall transfer level: Needs assistance Equipment used: Rolling walker (2 wheeled) Transfers: Sit to/from Stand Sit to Stand: Min guard         General transfer comment: Immobilizer donned. Mild increased time. Steady, cues for QS in stand several times befoe ambulating  Ambulation/Gait Ambulation/Gait assistance: Min guard;Supervision Ambulation Distance (Feet): 90 Feet Assistive device: Rolling  walker (2 wheeled) Gait Pattern/deviations: Step-to pattern;Step-through pattern;Decreased step length - left     General Gait Details: Cues for improved step lengths for more equal; corrects inconsistently   Stairs            Wheelchair Mobility    Modified Rankin (Stroke Patients Only)       Balance Overall balance assessment: Needs assistance Sitting-balance support: Feet supported Sitting balance-Leahy Scale: Normal     Standing balance support: Bilateral upper extremity supported Standing balance-Leahy Scale: Fair                      Cognition Arousal/Alertness: Awake/alert Behavior During Therapy: WFL for tasks assessed/performed Overall Cognitive Status: Within Functional Limits for tasks assessed                      Exercises Total Joint Exercises Ankle Circles/Pumps: AROM;Both;20 reps;Supine Quad Sets: Strengthening;Right;20 reps;Seated (RLE extended with gravity assisted stretch) Knee Flexion: Right;10 reps;Seated;AROM (3 positions each rep with 10 sec hold each) Goniometric ROM: 0-87    General Comments        Pertinent Vitals/Pain Pain Assessment: 0-10 Pain Score: 4  Pain Location: R knee Pain Intervention(s): Monitored during session;Repositioned;Ice applied;Premedicated before session    Home Living                      Prior Function            PT Goals (current goals can now be found in the care plan section) Progress towards PT goals: Progressing toward goals    Frequency    BID  PT Plan Current plan remains appropriate    Co-evaluation             End of Session Equipment Utilized During Treatment: Right knee immobilizer Activity Tolerance: Patient tolerated treatment well Patient left: in chair;with call bell/phone within reach;with chair alarm set;with family/visitor present;Other (comment) (polar care in place)     Time: [id number] PT Time Calculation (min) (ACUTE ONLY): 26  min  Charges:  $Gait Training: 8-22 mins $Therapeutic Exercise: 8-22 mins                    G Codes:      Scot DockHeidi E Barnes, PTA 08/11/2016, 11:25 AM

## 2016-08-11 NOTE — Progress Notes (Signed)
   Subjective: 3 Days Post-Op Procedure(s) (LRB): TOTAL KNEE ARTHROPLASTY (Right) Patient reports pain as mild.   Patient is well, but has had some minor complaints of nausea with out vomiting. No abdominal pain. Feels better after 1 unit PRBC. Denies any CP/palpitations, SOB, ABD pain We will continue with therapy today.    Objective: Vital signs in last 24 hours: Temp:  [98.9 F (37.2 C)-99.9 F (37.7 C)] 99.5 F (37.5 C) (12/18 0721) Pulse Rate:  [85-120] 118 (12/18 0721) Resp:  [16-18] 16 (12/18 0721) BP: (107-162)/(54-81) 128/77 (12/18 0721) SpO2:  [93 %-100 %] 94 % (12/18 0721)  Intake/Output from previous day: 12/17 0701 - 12/18 0700 In: 880 [P.O.:600; Blood:280] Out: 800 [Urine:800] Intake/Output this shift: No intake/output data recorded.   Recent Labs  08/08/16 1459 08/09/16 0323 08/10/16 0345 08/10/16 2040 08/11/16 0320  HGB 9.2* 8.5* 7.8* 8.5* 8.7*    Recent Labs  08/10/16 0345 08/10/16 2040 08/11/16 0320  WBC 7.0  --  8.0  RBC 2.92*  --  3.20*  HCT 23.5* 25.4* 25.6*  PLT 290  --  308    Recent Labs  08/10/16 0345 08/11/16 0320  NA 135 136  K 4.2 4.1  CL 104 103  CO2 25 26  BUN 16 12  CREATININE 0.93 0.96  GLUCOSE 65 95  CALCIUM 9.2 9.7   No results for input(s): LABPT, INR in the last 72 hours.  EXAM General - Patient is Alert, Appropriate and Oriented Extremity - Neurovascular intact Sensation intact distally Intact pulses distally Dorsiflexion/Plantar flexion intact No cellulitis present Compartment soft  Unable to straight leg raise - homans sign Abdomen: soft non tender. Dressing - dressing C/D/I, no drainage and wound vac intact with no saturation of spounge.  Motor Function - intact, moving foot and toes well on exam.   Past Medical History:  Diagnosis Date  . Anemia   . Arthritis   . Complication of anesthesia 12/13/2015   aspiration after breathing tube removed after several surgeries  . Depression   . Diabetes  mellitus without complication ([facility])   . GERD (gastroesophageal reflux disease)   . Hyperlipidemia   . Hypertension   . Infection 2012   right knee infection, required arthroscopy, long term antibiotic treatment, aspiration therapy    Assessment/Plan:   3 Days Post-Op Procedure(s) (LRB): TOTAL KNEE ARTHROPLASTY (Right) Active Problems:   Primary osteoarthritis of right knee   Acute post op blood loss anemia    UTI   Hyperkalemia   Tachycardia    Estimated body mass index is 32.28 kg/m as calculated from the following:   Height as of this encounter: 5\' 6"  (1.676 m).   Weight as of this encounter: 90.7 kg (200 lb). Advance diet Up with therapy  Needs BM Intraoperative cultures showing no growth HR elevated, will order EKG. Give bolus of NS bolus. Continue with metoprolol.  Acute post op blood loss anemia - Hgb 8.7. s/p 1 unit of PRBC.   Hyperkalemia - resolved.   UTI - start macrobid 100mg  BID x 5 days. Culture ordered.   DVT Prophylaxis - Lovenox, Foot Pumps and TED hose Weight-Bearing as tolerated to right leg   T. Cranston Neighborhris Gaines, PA-C [facility] 08/11/2016, 8:11 AM

## 2016-08-11 NOTE — Care Management Note (Signed)
Case Management Note  Patient Details  Name: Patricia [name] MRN: [id number] Date of Birth: 01/18/1966  Subjective/Objective:   Discharging today               Action/Plan: [facility] notified of discharge. Cost of Lovenox is $30.00  Expected Discharge Date:   08/11/2016               Expected Discharge Plan:  Home w Home Health Services  In-House Referral:     Discharge planning Services  CM Consult  Post Acute Care Choice:  Home Health Choice offered to:  Patient  DME Arranged:    DME Agency:     HH Arranged:  PT HH Agency:  [facility] (formerly [facility])  Status of Service:  In process, will continue to follow  If discussed at Long Length of Stay Meetings, dates discussed:    Additional Comments:  Marily MemosLisa M Nadiah Corbit, RN 08/11/2016, 2:24 PM

## 2016-08-11 NOTE — Care Management (Signed)
Barrier to discharge: New UTI, anemic for acute blood loss, s/p 1 unit PRBC. Tachycardia, with IVF, Hyper kalemia resolved. Discharge arranged.

## 2016-08-11 NOTE — Progress Notes (Signed)
Physical Therapy Treatment Patient Details Name: Patricia Patricia [name] MRN: [id number] DOB: 09/29/1965 Today's Date: Patricia [name]    History of Present Illness Patricia Patricia [name] is a 50yo white female who [facility] for [facility] Rt TKA. [name] reports history of infection in the Rt knee and pain starting back in February, which has limited her mobility to primarily household distances with intermittent AD use.     [name] Comments    [name] SBA with transfers and ambulation 140 feet with RW (R KI donned).  [name] steady during session without loss of balance using RW.  Educated [name] and [name]'s daughter on correct placement and fit and use of polar care and KI (both appearing with good understanding).  [name] continues to demonstrate significant R quad weakness (unable to perform SLR independently) and requiring continued use of KI for when OOB/walking/stairs ([name] and [name]'s daughter educated on this).  [name] declined multiple times during session to trial stairs d/t only having one very small step to enter so therapist performed verbal and visual demonstration on how to properly navigate small step with RW and [name] and [name]'s daughter verbalizing good understanding.  Recommend [name] discharge home with HHPT, support of family, use of KI and RW, and assist for stairs for safety.   Follow Up Recommendations  Home health [name];Supervision - Intermittent; (assist for stairs)     Equipment Recommendations  Rolling walker with 5" wheels;3in1 ([name])    Recommendations for Other Services       Precautions / Restrictions Precautions Precautions: Fall;Knee Precaution Booklet Issued: Yes (comment) Required Braces or Orthoses: Knee Immobilizer - Right Knee Immobilizer - Right: On when out of bed or walking Restrictions Weight Bearing Restrictions: Yes RLE Weight Bearing: Weight bearing as tolerated    Mobility  Bed Mobility Overal bed mobility: Modified Independent Bed Mobility: Sit to Supine       Sit to supine: Modified independent   General bed mobility comments: use of L LE and UE's to assist R LE in/out of bed with KI donned  Transfers Overall transfer level: Needs assistance Equipment used: Rolling walker (2 wheeled) Transfers: Sit to/from Stand Sit to Stand: Supervision  Toilet transfer: Supervision       General transfer comment: Knee immobilizer donned. Steady without loss of balance.  Ambulation/Gait Ambulation/Gait assistance: Supervision Ambulation Distance (Feet): 140 Feet Assistive device: Rolling walker (2 wheeled) Gait Pattern/deviations: Decreased stance time - right;Decreased step length - left Gait velocity: decreased   General Gait Details: limited distance d/t UE fatigue; steady without loss of balance   Stairs Stairs:  ([name] declined to try stairs multiple times during session)          Wheelchair Mobility    Modified Rankin (Stroke Patients Only)       Balance Overall balance assessment: No apparent balance deficits (not formally assessed) Sitting-balance support: No upper extremity supported;Feet supported Sitting balance-Leahy Scale: Normal     Standing balance support: Bilateral upper extremity supported (UE supported on RW) Standing balance-Leahy Scale: Good                      Cognition Arousal/Alertness: Awake/alert Behavior During Therapy: WFL for tasks assessed/performed Overall Cognitive Status: Within Functional Limits for tasks assessed                      Exercises      General Comments General comments (skin integrity, edema, etc.): [name]'s daughter present during session.  [name] agreeable to [name]  session.  MD Rosita KeaMenz present during session and reporting plan to discharge [name] home tonight.      Pertinent Vitals/Pain Pain Assessment: 0-10 Pain Score: 6  (with activity 5-6/10; at rest 4-5/10) Pain Location: R knee Pain Descriptors / Indicators: Sore;Tender Pain Intervention(s): Limited activity within patient's tolerance;Monitored during  session;Repositioned;Ice applied    Home Living                      Prior Function            [name] Goals (current goals can now be found in the care plan section) Acute Rehab [name] Goals Patient Stated Goal: return home  [name] Goal Formulation: With patient Time For Goal Achievement: 08/23/16 Potential to Achieve Goals: Good Progress towards [name] goals: Progressing toward goals    Frequency    BID      [name] Plan Current plan remains appropriate    Co-evaluation             End of Session Equipment Utilized During Treatment: Right knee immobilizer;Gait belt Activity Tolerance: Patient tolerated treatment well Patient left: in bed;with call bell/phone within reach;with family/visitor present (polar care in place on R knee but not activated d/t [name]'s daughter preparing to get [name] dressed to go home)     Time: [id number] [name] Time Calculation (min) (ACUTE ONLY): 45 min  Charges:  $Gait Training: 8-22 mins $Therapeutic Exercise: 8-22 mins $Therapeutic Activity: 8-22 mins                    G CodesHendricks Patricia [name]:      Patricia Patricia [name] Patricia LimesEmily Lora Patricia [name], [name] [phone]

## 2016-08-11 NOTE — Discharge Instructions (Signed)

## 2016-08-12 ENCOUNTER — Telehealth: Payer: Self-pay | Admitting: Family Medicine

## 2016-08-12 NOTE — Telephone Encounter (Signed)
Pt was just released from hospital after knee replacement.  Family checked her sugar earlier this morning and it was 415.  At 8:30 this morning sugar was 502.  Patricia [name]'s call back number is [phone]

## 2016-08-12 NOTE — Telephone Encounter (Signed)
Reviewed chart, recent hospitalization for Right knee total arthroplasty ortho surgery, 12/15 and discharged 12/18. She had a chronic history of Right knee arthroscopy with infection x 2 in April 2017. I spoke with daughter of patient, Joni Reiningicole, who reported overnight last night patient's CBG was 64 (had poor appetite after hospital), she was given food and did better, this morning 12/19 0630 CBG was 415, and then repeat later was 502. She had no new symptoms of hyperglycemia. Daughter expresses concern with also hypoglycemia in hospital at 43, required ampd50 and other treatment, she was warned about poor PO after her surgery and has a PICC line just incase, may need IV nutrition or support in which case was advised to follow-up and may need HH therapy.  Currently she seems to be improved without new symptoms or concerns, took all normal DM medications this morning with Metformin, Amaryl 8mg , and Toujeo 50units, and does sliding scale 20 to 40 at bedtime. She is currently on several antibiotics, and has a wound vac post-op. Per chart review and daughter report do not think she received steroids. I advised her that her CBG may be variable or transiently elevated due to her significant infection that was treated also with physiological stress of surgery, and that my concern in this post-op period was more for preventing hypoglycemia, and making sure she gets appropriate nutrition. I would not make change to her basal insulin based on the elevated sugar. Keep checking closely, and follow-up as needed. Reviewed signs/symptoms when to seek immediate medical attention.  Saralyn PilarAlexander Taylormarie Register, DO [facility] 08/12/2016, 9:22 AM

## 2016-08-13 LAB — AEROBIC/ANAEROBIC CULTURE (SURGICAL/DEEP WOUND): CULTURE: NO GROWTH

## 2016-08-13 LAB — AEROBIC/ANAEROBIC CULTURE W GRAM STAIN (SURGICAL/DEEP WOUND)
Culture: NO GROWTH
Gram Stain: NONE SEEN

## 2016-08-14 LAB — CULTURE, BLOOD (ROUTINE X 2)
CULTURE: NO GROWTH
CULTURE: NO GROWTH

## 2016-08-17 ENCOUNTER — Emergency Department
Admission: EM | Admit: 2016-08-17 | Discharge: 2016-08-18 | Disposition: A | Discharge status: Home / Self Care | Payer: BC Managed Care – PPO | Attending: Emergency Medicine | Admitting: Emergency Medicine

## 2016-08-17 ENCOUNTER — Emergency Department: Payer: BC Managed Care – PPO

## 2016-08-17 ENCOUNTER — Encounter: Payer: Self-pay | Admitting: Emergency Medicine

## 2016-08-17 DIAGNOSIS — Z7982 Long term (current) use of aspirin: Secondary | ICD-10-CM | POA: Diagnosis not present

## 2016-08-17 DIAGNOSIS — E119 Type 2 diabetes mellitus without complications: Secondary | ICD-10-CM | POA: Diagnosis not present

## 2016-08-17 DIAGNOSIS — R600 Localized edema: Secondary | ICD-10-CM | POA: Insufficient documentation

## 2016-08-17 DIAGNOSIS — Z794 Long term (current) use of insulin: Secondary | ICD-10-CM | POA: Diagnosis not present

## 2016-08-17 DIAGNOSIS — Z79899 Other long term (current) drug therapy: Secondary | ICD-10-CM | POA: Diagnosis not present

## 2016-08-17 DIAGNOSIS — Z7901 Long term (current) use of anticoagulants: Secondary | ICD-10-CM | POA: Diagnosis not present

## 2016-08-17 DIAGNOSIS — I10 Essential (primary) hypertension: Secondary | ICD-10-CM | POA: Diagnosis not present

## 2016-08-17 NOTE — ED Provider Notes (Signed)
[facility] Emergency Department Provider Note   First MD Initiated Contact with Patient 08/17/16 2307     (approximate)  I have reviewed the triage vital signs and the nursing notes.   HISTORY  Chief Complaint Wound Check    HPI Patricia [name] is a 50 y.o. female below list of chronic medical conditions including total knee arthroplasty performed on 08/08/2016 presents with right knee wound VAC which is "not working". Patient stated that she noted that the wound VAC was not working today. In addition the patient also admits to having a right arm PICC line which is currently not in use that she has been taking care of at home by herself. Patient states that she did not have health scheduled to come to the home to address the PICC line and a such she is been doing it herself.   Past Medical History:  Diagnosis Date  . Anemia   . Arthritis   . Complication of anesthesia 12/13/2015   aspiration after breathing tube removed after several surgeries  . Depression   . Diabetes mellitus without complication ([place])   . GERD (gastroesophageal reflux disease)   . Hyperlipidemia   . Hypertension   . Infection 2012   right knee infection, required arthroscopy, long term antibiotic treatment, aspiration therapy    Patient Active Problem List   Diagnosis Date Noted  . Primary osteoarthritis of right knee 08/08/2016  . Hyperlipidemia due to type 2 diabetes mellitus ([place]) 01/25/2016  . Septic joint of right knee joint ([place]) 12/13/2015  . Septic arthritis of knee, right ([place]) 11/30/2015  . Hypertension 09/05/2015  . BPPV (benign paroxysmal positional vertigo) 09/05/2015  . Insomnia 09/05/2015  . 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 polyneuropathy, without long-term current use of insulin ([place]) 09/05/2015    Past Surgical History:  Procedure Laterality Date  . CHOLECYSTECTOMY    . KNEE ARTHROSCOPY Right 11/30/2015   Procedure: ARTHROSCOPY KNEE;   Surgeon: Hessie Knows, MD;  Location: [facility] ORS;  Service: Orthopedics;  Laterality: Right;  . KNEE ARTHROSCOPY Right 12/13/2015   Procedure: ARTHROSCOPY KNEE AND DEBRIDEMENT;  Surgeon: Hessie Knows, MD;  Location: [facility] ORS;  Service: Orthopedics;  Laterality: Right;  . MRSA abdominal wall    . Right knee arthroscopic    . TOTAL KNEE ARTHROPLASTY Right 08/08/2016   Procedure: TOTAL KNEE ARTHROPLASTY;  Surgeon: Hessie Knows, MD;  Location: [facility] ORS;  Service: Orthopedics;  Laterality: Right;  . TUBAL LIGATION  1997    Prior to Admission medications   Medication Sig Start Date End Date Taking? Authorizing Provider  acetaminophen (TYLENOL 8 HOUR ARTHRITIS PAIN) 650 MG CR tablet Take 650 mg by mouth every 8 (eight) hours as needed for pain.    Historical Provider, MD  aspirin EC 81 MG tablet Take 81 mg by mouth daily.     Historical Provider, MD  atorvastatin (LIPITOR) 10 MG tablet Take 1 tablet (10 mg total) by mouth daily. 01/25/16   Amy Overton Mam, NP  blood glucose meter kit and supplies KIT Dispense based on patient and insurance preference. Use up to four times daily as directed. (FOR ICD-9 250.00, 250.01). 09/05/15   Amy Overton Mam, NP  celecoxib (CELEBREX) 200 MG capsule Take 200 mg by mouth daily as needed (for pain.).  12/24/15   Historical Provider, MD  enoxaparin (LOVENOX) 40 MG/0.4ML injection Inject 0.4 mLs (40 mg total) into the skin daily. 08/11/16 08/25/16  Duanne Guess, PA-C  famotidine [facility]  FAMOTIDINE 10) 10 MG tablet Take 10 mg by mouth 2 (two) times daily.     Historical Provider, MD  FLUoxetine (PROZAC) 40 MG capsule TAKE ONE CAPSULE BY MOUTH ONCE DAILY *PATIENT NEEDS TO MAKE APPOINTMENT* 12/18/15   Amy Overton Mam, NP  glimepiride (AMARYL) 4 MG tablet Take 2 tablets (8 mg total) by mouth daily with breakfast. Patient taking differently: Take 4 mg by mouth 2 (two) times daily.  02/11/16   Amy Overton Mam, NP  Insulin Glargine (TOUJEO SOLOSTAR) 300 UNIT/ML SOPN Inject 40 Units  into the skin at bedtime. Patient taking differently: Inject 40-60 Units into the skin 2 (two) times daily. 20 units in the morning and 40 units at bedtime.  Patient takes her blood sugar prior to giving night time dose.  Depending on night time blood glucose reading  (SLIDING SCALE SHE INCREASES/DECREASES BY 2 UNITS DEPENDING ON CBG) 12/13/15   Hessie Knows, MD  lisinopril (PRINIVIL,ZESTRIL) 20 MG tablet Take 1 tablet (20 mg total) by mouth daily. 01/25/16   Amy Overton Mam, NP  metFORMIN (GLUCOPHAGE) 1000 MG tablet Take 1 tablet (1,000 mg total) by mouth 2 (two) times daily with a meal. 04/01/16   Amy Overton Mam, NP  metoprolol tartrate (LOPRESSOR) 25 MG tablet TAKE ONE TABLET BY MOUTH TWICE DAILY 12/18/15   Amy Overton Mam, NP  nitrofurantoin, macrocrystal-monohydrate, (MACROBID) 100 MG capsule Take 1 capsule (100 mg total) by mouth every 12 (twelve) hours. 08/11/16   Duanne Guess, PA-C  ONE TOUCH ULTRA TEST test strip CHECK BLOOD GLUCOSE UP TO FOUR TIMES DAILY AS DIRECTED 10/08/15   Amy Overton Mam, NP  ONETOUCH DELICA LANCETS 30Q MISC USE TO CHECK BLOOD GLUCOSE UP TO FOUR TIMES DAILY AS DIRECTED 12/18/15   Amy Overton Mam, NP  oxyCODONE (OXY IR/ROXICODONE) 5 MG immediate release tablet Take 1-2 tablets (5-10 mg total) by mouth every 3 (three) hours as needed for breakthrough pain. 08/11/16   Duanne Guess, PA-C  RELION PEN NEEDLES 29G X 12MM MISC USE AS DIRECTED DAILY 12/18/15   Amy Overton Mam, NP  traMADol (ULTRAM) 50 MG tablet Take 50 mg by mouth every 6 (six) hours as needed. For pain. 06/24/16   Historical Provider, MD    Allergies Patient has no known allergies.  Family History  Problem Relation Age of Onset  . Diabetes Mother   . CAD Mother   . Diabetes Father   . Hyperlipidemia Father     Social History Social History  Substance Use Topics  . Smoking status: Never Smoker  . Smokeless tobacco: Never Used  . Alcohol use No     Comment: rare    Review of  Systems Constitutional: No fever/chills Eyes: No visual changes. ENT: No sore throat. Cardiovascular: Denies chest pain. Respiratory: Denies shortness of breath. Gastrointestinal: No abdominal pain.  No nausea, no vomiting.  No diarrhea.  No constipation. Genitourinary: Negative for dysuria. Musculoskeletal: Negative for back pain. Positive for right lower extremity swelling Skin: Negative for rash. Neurological: Negative for headaches, focal weakness or numbness.  10-point ROS otherwise negative.  ____________________________________________   PHYSICAL EXAM:  VITAL SIGNS: ED Triage Vitals [08/17/16 2002]  Enc Vitals Group     BP (!) 163/84     Pulse Rate 100     Resp 16     Temp 98.5 F (36.9 C)     Temp Source Oral     SpO2 97 %     Weight 198 lb (89.8 kg)  Height _0  (1.676 m)     Head Circumference      Peak Flow      Pain Score 0     Pain Loc      Pain Edu?      Excl. in [place]?     Constitutional: Alert and oriented. Well appearing and in no acute distress. Eyes: Conjunctivae are normal. PERRL. EOMI. Head: Atraumatic. Mouth/Throat: Mucous membranes are moist.  Oropharynx non-erythematous. Neck: No stridor.  Cardiovascular: Normal rate, regular rhythm. Good peripheral circulation. Grossly normal heart sounds. Respiratory: Normal respiratory effort.  No retractions. Lungs CTAB. Gastrointestinal: Soft and nontender. No distention.  Musculoskeletal:1+ right lower extremity nonpitting edema Neurologic:  Normal speech and language. No gross focal neurologic deficits are appreciated.  Skin:  Skin is warm, dry and intact. No rash noted. Psychiatric: Mood and affect are normal. Speech and behavior are normal.   RADIOLOGY I, [place], personally viewed and evaluated these images (plain radiographs) as part of my medical decision making, as well as reviewing the written report by the radiologist.  US Venous Img Lower Unilateral Right  Result Date:  08/18/2016 CLINICAL DATA:  Right leg pain and swelling. Post total knee arthroplasty 10 days prior. EXAM: RIGHT LOWER EXTREMITY VENOUS DOPPLER ULTRASOUND TECHNIQUE: Gray-scale sonography with graded compression, as well as color Doppler and duplex ultrasound were performed to evaluate the lower extremity deep venous systems from the level of the common femoral vein and including the common femoral, femoral, profunda femoral, popliteal and calf veins including the posterior tibial, peroneal and gastrocnemius veins when visible. The superficial great saphenous vein was also interrogated. Spectral Doppler was utilized to evaluate flow at rest and with distal augmentation maneuvers in the common femoral, femoral and popliteal veins. COMPARISON:  None. FINDINGS: Contralateral Common Femoral Vein: Respiratory phasicity is normal and symmetric with the symptomatic side. No evidence of thrombus. Normal compressibility. Common Femoral Vein: No evidence of thrombus. Normal compressibility, respiratory phasicity and response to augmentation. Saphenofemoral Junction: No evidence of thrombus. Normal compressibility and flow on color Doppler imaging. Profunda Femoral Vein: No evidence of thrombus. Normal compressibility and flow on color Doppler imaging. Femoral Vein: No evidence of thrombus. Normal compressibility, respiratory phasicity and response to augmentation. Popliteal Vein: No evidence of thrombus. Normal compressibility, respiratory phasicity and response to augmentation. Calf Veins: No evidence of thrombus. Normal compressibility and flow on color Doppler imaging. Superficial Great Saphenous Vein: No evidence of thrombus. Normal compressibility and flow on color Doppler imaging. Venous Reflux:  None. Other Findings:  None. IMPRESSION: No evidence of right lower extremity deep venous thrombosis. Electronically Signed   By: Jeb Levering M.D.   On: 08/18/2016 01:26   Dg Chest Portable 1 View  Result Date:  08/17/2016 CLINICAL DATA:  Confirm PICC placement. EXAM: PORTABLE CHEST 1 VIEW COMPARISON:  12/01/2015 FINDINGS: Tip of the right upper extremity PICC in the axilla in the region of the axillary vein. No additional central line is seen. Lung volumes are low. Normal heart size and mediastinal contours. No focal airspace disease, pulmonary edema or pleural fluid. IMPRESSION: Tip of the right upper extremity PICC in the region of the axillary vein. Low lung volumes without acute abnormality. Electronically Signed   By: Jeb Levering M.D.   On: 08/17/2016 23:33    Procedures    INITIAL IMPRESSION / ASSESSMENT AND PLAN / ED COURSE  Pertinent labs & imaging results that were available during my care of the patient were reviewed by me and  considered in my medical decision making (see chart for details).  Patient discussed with Dr. Kyla Balzarine after wound that was evaluated and started working. Dr. Kyla Balzarine agreed with continuing the wound VAC until follow-up on 08/22/2016. In addition regarding PICC line and he asked that I remove it. Clinical Course     ____________________________________________  FINAL CLINICAL IMPRESSION(S) / ED DIAGNOSES  PICC line removal Right lower extremity swelling status post right knee arthroplasty  MEDICATIONS GIVEN DURING THIS VISIT:  Medications - No data to display   NEW OUTPATIENT MEDICATIONS STARTED DURING THIS VISIT:  New Prescriptions   No medications on file    Modified Medications   No medications on file    Discontinued Medications   No medications on file     Note:  This document was prepared using Dragon voice recognition software and may include unintentional dictation errors.    Gregor Hams, MD 08/18/16 [phone]

## 2016-08-17 NOTE — ED Notes (Signed)
Patient reports her wound vac was malfunctioning when she awoke this morning - not suctioning. Patient also reports that her picc line dressing started to come off at home. Pt redressed herself. Pt did not use sterile technique - patient reports she was not shown how prior to discharge. Pt's picc line does not appear to be in proper position upon inspection.   Pt c/o right leg/foot discomfort/change in sensation as well as edema. Patient reports discomfort/change in sensation was present when she awoke. Pt reports the edema began at approx 1400 today.

## 2016-08-17 NOTE — ED Triage Notes (Signed)
Pt states is here because her wound vac is not "sucking" anymore. Pt did not call her pmd to report of medical supply company. Pt states "i've only been wearing it since Friday and I didn't know what to do". Pt has wound to right leg that requires evacuation.

## 2016-08-17 NOTE — ED Notes (Signed)
MD Brown at bedside.

## 2016-08-18 ENCOUNTER — Emergency Department: Payer: BC Managed Care – PPO

## 2016-08-18 MED ORDER — MORPHINE SULFATE (PF) 4 MG/ML IV SOLN
INTRAVENOUS | Status: DC
Start: 2016-08-18 — End: 2016-08-18
  Filled 2016-08-18: qty 1

## 2016-08-18 NOTE — ED Notes (Signed)
MD Manson PasseyBrown removed PICC line at bedside. Pressure applied continuously for 15 minutes post removal. Site is clean, dry and intact at this time. RN secured dressing. RN will continue to monitor.

## 2016-10-28 ENCOUNTER — Other Ambulatory Visit: Payer: Self-pay | Admitting: *Deleted

## 2016-10-28 MED ORDER — GLUCOSE BLOOD VI STRP: ORAL_STRIP | 11 refills | Status: DC

## 2016-10-29 ENCOUNTER — Other Ambulatory Visit: Payer: Self-pay | Admitting: *Deleted

## 2016-10-29 MED ORDER — INSULIN PEN NEEDLE 31G X 6 MM MISC: 1.0000 | Freq: Every day | 11 refills | Status: AC

## 2016-10-31 ENCOUNTER — Ambulatory Visit (INDEPENDENT_AMBULATORY_CARE_PROVIDER_SITE_OTHER): Payer: BC Managed Care – PPO | Admitting: Family Medicine

## 2016-10-31 ENCOUNTER — Encounter: Payer: Self-pay | Admitting: Family Medicine

## 2016-10-31 VITALS — BP 136/78 | HR 88 | Temp 98.3°F | Resp 16 | Ht 66.0 in | Wt 210.0 lb

## 2016-10-31 DIAGNOSIS — F419 Anxiety disorder, unspecified: Secondary | ICD-10-CM

## 2016-10-31 DIAGNOSIS — Z794 Long term (current) use of insulin: Secondary | ICD-10-CM | POA: Diagnosis not present

## 2016-10-31 DIAGNOSIS — I10 Essential (primary) hypertension: Secondary | ICD-10-CM | POA: Diagnosis not present

## 2016-10-31 DIAGNOSIS — E785 Hyperlipidemia, unspecified: Secondary | ICD-10-CM

## 2016-10-31 DIAGNOSIS — E1169 Type 2 diabetes mellitus with other specified complication: Secondary | ICD-10-CM | POA: Diagnosis not present

## 2016-10-31 DIAGNOSIS — Z96651 Presence of right artificial knee joint: Secondary | ICD-10-CM

## 2016-10-31 DIAGNOSIS — E1142 Type 2 diabetes mellitus with diabetic polyneuropathy: Secondary | ICD-10-CM

## 2016-10-31 LAB — POCT GLYCOSYLATED HEMOGLOBIN (HGB A1C): Hemoglobin A1C: 7.3

## 2016-10-31 MED ORDER — ATORVASTATIN CALCIUM 10 MG PO TABS: 10.0000 mg | ORAL_TABLET | Freq: Every day | ORAL | 3 refills | Status: DC

## 2016-10-31 MED ORDER — METOPROLOL TARTRATE 25 MG PO TABS: 25.0000 mg | ORAL_TABLET | Freq: Two times a day (BID) | ORAL | 3 refills | Status: DC

## 2016-10-31 MED ORDER — INSULIN GLARGINE 300 UNIT/ML ~~LOC~~ SOPN: 40.0000 [IU] | PEN_INJECTOR | Freq: Two times a day (BID) | SUBCUTANEOUS | 3 refills | Status: DC

## 2016-10-31 MED ORDER — FLUOXETINE HCL 40 MG PO CAPS: 40.0000 mg | ORAL_CAPSULE | Freq: Every day | ORAL | 3 refills | Status: DC

## 2016-10-31 MED ORDER — METFORMIN HCL 1000 MG PO TABS: 1000.0000 mg | ORAL_TABLET | Freq: Two times a day (BID) | ORAL | 3 refills | Status: DC

## 2016-10-31 MED ORDER — GLIMEPIRIDE 4 MG PO TABS: 4.0000 mg | ORAL_TABLET | Freq: Two times a day (BID) | ORAL | 3 refills | Status: DC

## 2016-10-31 NOTE — Patient Instructions (Signed)
Thank you for coming in to clinic today.  1. Refilled all meds  Need all details on Pen Needle, G / MM  In future, consider alternative diabetes meds as discussed, read about these, ask ins coverage  - Bydureon BCise (Bydureon, Exenatide) - weekly - Trulicity (Dulaglutide) - weekly - Victoza (Liraglutide) - daily  Regarding leg - Recommend elevation as discussed, if worsening pain, swelling, redness, contact Dr Rosita KeaMenz  Please schedule a follow-up appointment with Dr. Althea CharonKaramalegos in 3 months DM A1c  If you have any other questions or concerns, please feel free to call the clinic or send a message through MyChart. You may also schedule an earlier appointment if necessary.  Saralyn PilarAlexander Karamalegos, DO [facility]

## 2016-10-31 NOTE — [place] Notes (Signed)
Subjective:    Patient ID: Patricia [name], female    DOB: 1966/06/10, 51 y.o.   MRN: [id number]  Patricia [name] is a 51 y.o. female presenting on 10/31/2016 for Diabetes (highest BS 330 and lowest BS 50)   HPI   CHRONIC DM, Type 2, with NEUROPATHY: Reports prior history of R knee surgery with infection, see below, had been on antibiotics affecting her appetite (with lower A1c), improved now off antibiotics no more nausea, increased appetite - Recent A1c trend 10 > 6.9 > 7.3 CBGs (OneTouch meter): Avg 110s, High 300s (occasionally in evening). Checks CBGs 1-3x daily, admits occasional overnight dramatic drop of blood sugar, can wake up, will feel dizzy, has had CBG in past at 40, usually around 60s - about 4-5 times in past 8 months Meds: Metformin 1000mg  BID, Toujeo (started 08/2015) uses BID (20-25 in AM between morning and lunch, 10-11am, and then evening dose about 30-40u depending on earlier sugars, uses a sliding scale), Amaryl 4mg  BID - no changes since 08/2015 Reports good compliance. Tolerating well w/o side-effects Currently on ACEi, on statin Lifestyle: Diet (changed some snacks during day with yogurt and special k cereal, sometimes eats late) / Exercise (limited currently due to R knee surgery, infection, previously was more active) - Takes ASA, Statin - Admits some neuropathy in feet Denies hypoglycemia, polyuria, visual changes, numbness or tingling.  CHRONIC HTN: Reports normally better controlled, initial check today elevated. Current Meds - Metoprolol 25mg  BID, Lisinopril 20mg  daily,    Reports good compliance, took meds today. Tolerating well, w/o complaints. Denies CP, dyspnea, HA, edema, dizziness / lightheadedness  HYPERLIPIDEMIA: - Reports no concerns. Last lipid panel 08/2015, abnormal LDL TG. - Currently taking atorvastatin 10mg , tolerating well without side effects or myalgias  S/p RIGHT KNEE TOTAL ARTHROPLASTY 07/2016 / H/o Septic Arthritis MRSA R Knee -  Past history R knee pain / swelling since 09/2015, eventually dx with MRSA, required extensive management (aspiration, arthroscopy, long-term antibiotics) see chart. Ultimately, now patient is s/p R knee total arthroplasty in 07/2016. Has been doing well, on outpatient PT/rehab. - Admits some gradual increased swelling Right lower leg below knee laterally, seems worse at end of day with prolonged standing, not sitting as much as advised, works as Runner, broadcasting/film/video. Also not always elevating leg as advised. - Takes Tramadol PRN rarely, per Orthopedics - Prior history of MRSA twice >8 years ago - Denies fever, chills, sweating, redness, nausea, vomiting, other joint pain or swelling  Additional PMH - GERD, Anxiety  Requesting all refills printed today, 90 day supply, had issues at pharmacy giving different durations of rx.  Social History  Substance Use Topics  . Smoking status: Never Smoker  . Smokeless tobacco: Never Used  . Alcohol use No     Comment: rare    Review of Systems Per HPI unless specifically indicated above     Objective:    BP 136/78 (BP Location: Left Arm, Patient Position: Sitting, Cuff Size: Normal)   Pulse 88   Temp 98.3 F (36.8 C) (Oral)   Resp 16   Ht 5\' 6"  (1.676 m)   Wt 210 lb (95.3 kg)   LMP 07/10/2015 (Approximate)   BMI 33.89 kg/m   Wt Readings from Last 3 Encounters:  10/31/16 210 lb (95.3 kg)  08/17/16 198 lb (89.8 kg)  08/08/16 200 lb (90.7 kg)    Physical Exam  Constitutional: She is oriented to person, place, and time. She appears well-developed and well-nourished. No distress.  Well-appearing, comfortable, cooperative  HENT:  Head: Normocephalic and atraumatic.  Mouth/Throat: Oropharynx is clear and moist.  Cardiovascular: Normal rate, regular rhythm, normal heart sounds and intact distal pulses.   No murmur heard. Pulmonary/Chest: Effort normal.  Musculoskeletal: She exhibits edema (mild Right lower leg lateral leg non pitting slight edema, non  tender, see picture).  Neurological: She is alert and oriented to person, place, and time.  Skin: Skin is warm and dry. No rash noted. She is not diaphoretic. No erythema.  Psychiatric: She has a normal mood and affect. Her behavior is normal.  Well groomed, good eye contact, normal speech and thoughts  Nursing note and vitals reviewed.   Right Knee / Leg      Results for orders placed or performed in visit on 10/31/16  POCT HgB A1C  Result Value Ref Range   Hemoglobin A1C 7.3       Assessment & Plan:   Problem List Items Addressed This Visit    S/P total knee arthroplasty, right    Improving, post-op per Ortho Dr Rosita Kea Continue rehab Advised to continue with Tramadol per ortho at this time Follow-up as needed      Hypertension    Initial mild elevated, improved on re-check. Controlled No complications  Plan: 1. Continue current regimen - Metoprolol 25mg  BID, Lisinopril 20mg  daily - printed refills      Relevant Medications   metoprolol tartrate (LOPRESSOR) 25 MG tablet   atorvastatin (LIPITOR) 10 MG tablet   Hyperlipidemia associated with type 2 diabetes mellitus (HCC)    Prior mild abnormal lipids TG, LDL on statin Continue atorvastatin 10mg  daily, tolerating well, refilled Follow-up future fasting lipids      Relevant Medications   metFORMIN (GLUCOPHAGE) 1000 MG tablet   Insulin Glargine (TOUJEO SOLOSTAR) 300 UNIT/ML SOPN   glimepiride (AMARYL) 4 MG tablet   metoprolol tartrate (LOPRESSOR) 25 MG tablet   atorvastatin (LIPITOR) 10 MG tablet   Controlled type 2 diabetes mellitus with neuropathy (HCC) - Primary    Remains well controlled, A1c 7.3 today (from 6.9) overall much improved from prior >10, did have notable hypoglycemia previously, seems much improved. Complicated by MRSA infection (R knee septic arthritis). - Complication with mild neuropathy  Plan: 1. Discussion on suspect DM is much better controlled now that infection is resolved - may consider  switching agents or reducing insulin in future to add GLP1 (improved wt management, and maybe more stabilizing, given prior hypoglycemia and variable dosing on Toujeo) - no change today. Handout given with names of GLP1s, check cost/coverage, consider options 2. Refilled Toujeo 20u AM / 40u PM (with some sliding range) 3. Reduce Amaryl from 4mg  BID to 4mg  daily - did not seem to make this change last time 4. Continue Metformin 1000mg  BID 5. Encouraged work on improving DM diet,, eventually improve exercise pending knee recovery post-op 6. Continue ASA, statin, ACEi - due for DM eye exam 7. Follow-up 3 months for DM A1c, will need future fasting lipids, if A1c stable for up to 1 year then consider changes or sooner      Relevant Medications   metFORMIN (GLUCOPHAGE) 1000 MG tablet   Insulin Glargine (TOUJEO SOLOSTAR) 300 UNIT/ML SOPN   glimepiride (AMARYL) 4 MG tablet   atorvastatin (LIPITOR) 10 MG tablet   Anxiety disorder    Stable, controlled Refilled Fluoxetine      Relevant Medications   FLUoxetine (PROZAC) 40 MG capsule      Meds ordered this encounter  Medications  .  metFORMIN (GLUCOPHAGE) 1000 MG tablet    Sig: Take 1 tablet (1,000 mg total) by mouth 2 (two) times daily with a meal.    Dispense:  180 tablet    Refill:  3  . Insulin Glargine (TOUJEO SOLOSTAR) 300 UNIT/ML SOPN    Sig: Inject 40-60 Units into the skin 2 (two) times daily. 20 units in the morning and 40 units at bedtime    Dispense:  9 pen    Refill:  3    90 day supply  . glimepiride (AMARYL) 4 MG tablet    Sig: Take 1 tablet (4 mg total) by mouth 2 (two) times daily.    Dispense:  180 tablet    Refill:  3  . metoprolol tartrate (LOPRESSOR) 25 MG tablet    Sig: Take 1 tablet (25 mg total) by mouth 2 (two) times daily.    Dispense:  180 tablet    Refill:  3  . FLUoxetine (PROZAC) 40 MG capsule    Sig: Take 1 capsule (40 mg total) by mouth daily.    Dispense:  90 capsule    Refill:  3  . atorvastatin  (LIPITOR) 10 MG tablet    Sig: Take 1 tablet (10 mg total) by mouth daily.    Dispense:  90 tablet    Refill:  3      Follow up plan: Return in about 3 months (around 01/31/2017) for diabetes, blood pressure.  Patricia PilarAlexander Monifa Blanchette, DO [facility] 10/31/2016, 10:53 PM

## 2016-10-31 NOTE — Assessment & Plan Note (Signed)
Remains well controlled, A1c 7.3 today (from 6.9) overall much improved from prior >10, did have notable hypoglycemia previously, seems much improved. Complicated by MRSA infection (R knee septic arthritis). - Complication with mild neuropathy  Plan: 1. Discussion on suspect DM is much better controlled now that infection is resolved - may consider switching agents or reducing insulin in future to add GLP1 (improved wt management, and maybe more stabilizing, given prior hypoglycemia and variable dosing on Toujeo) - no change today. Handout given with names of GLP1s, check cost/coverage, consider options 2. Refilled Toujeo 20u AM / 40u PM (with some sliding range) 3. Reduce Amaryl from 4mg  BID to 4mg  daily - did not seem to make this change last time 4. Continue Metformin 1000mg  BID 5. Encouraged work on improving DM diet,, eventually improve exercise pending knee recovery post-op 6. Continue ASA, statin, ACEi - due for DM eye exam 7. Follow-up 3 months for DM A1c, will need future fasting lipids, if A1c stable for up to 1 year then consider changes or sooner

## 2016-10-31 NOTE — Assessment & Plan Note (Signed)
Stable, controlled Refilled Fluoxetine

## 2016-10-31 NOTE — Assessment & Plan Note (Signed)
Prior mild abnormal lipids TG, LDL on statin Continue atorvastatin 10mg  daily, tolerating well, refilled Follow-up future fasting lipids

## 2016-10-31 NOTE — Assessment & Plan Note (Signed)
Improving, post-op per Ortho Dr Rosita KeaMenz Continue rehab Advised to continue with Tramadol per ortho at this time Follow-up as needed

## 2016-10-31 NOTE — Assessment & Plan Note (Signed)
Initial mild elevated, improved on re-check. Controlled No complications  Plan: 1. Continue current regimen - Metoprolol 25mg  BID, Lisinopril 20mg  daily - printed refills

## 2016-12-03 ENCOUNTER — Telehealth: Payer: Self-pay | Admitting: Family Medicine

## 2016-12-03 DIAGNOSIS — I10 Essential (primary) hypertension: Secondary | ICD-10-CM

## 2016-12-03 MED ORDER — LISINOPRIL 20 MG PO TABS: 20.0000 mg | ORAL_TABLET | Freq: Every day | ORAL | 3 refills | Status: DC

## 2016-12-03 NOTE — Telephone Encounter (Signed)
Pt got prescriptions for all of her medications except lisinopril at last visit.  She needs a prescription for a 90 day supply or it can be done electronically to [facility] [address]  Her call back number is [phone]

## 2016-12-03 NOTE — Telephone Encounter (Signed)
Refill Lisinopril  daily #90, +3 refills sent to [facility] pharmacy  Saralyn Pilar, DO [facility] 12/03/2016, 9:49 AM

## 2016-12-15 ENCOUNTER — Emergency Department: Payer: BC Managed Care – PPO

## 2016-12-15 ENCOUNTER — Emergency Department
Admission: EM | Admit: 2016-12-15 | Discharge: 2016-12-15 | Disposition: A | Discharge status: Home / Self Care | Payer: BC Managed Care – PPO | Attending: Emergency Medicine | Admitting: Emergency Medicine

## 2016-12-15 ENCOUNTER — Encounter: Payer: Self-pay | Admitting: Emergency Medicine

## 2016-12-15 ENCOUNTER — Ambulatory Visit (INDEPENDENT_AMBULATORY_CARE_PROVIDER_SITE_OTHER): Payer: BC Managed Care – PPO | Admitting: Nurse Practitioner

## 2016-12-15 DIAGNOSIS — Z79899 Other long term (current) drug therapy: Secondary | ICD-10-CM | POA: Insufficient documentation

## 2016-12-15 DIAGNOSIS — R0602 Shortness of breath: Secondary | ICD-10-CM | POA: Insufficient documentation

## 2016-12-15 DIAGNOSIS — R062 Wheezing: Secondary | ICD-10-CM | POA: Diagnosis not present

## 2016-12-15 DIAGNOSIS — Z7982 Long term (current) use of aspirin: Secondary | ICD-10-CM | POA: Insufficient documentation

## 2016-12-15 DIAGNOSIS — E119 Type 2 diabetes mellitus without complications: Secondary | ICD-10-CM | POA: Diagnosis not present

## 2016-12-15 DIAGNOSIS — R05 Cough: Secondary | ICD-10-CM | POA: Insufficient documentation

## 2016-12-15 DIAGNOSIS — Z794 Long term (current) use of insulin: Secondary | ICD-10-CM | POA: Diagnosis not present

## 2016-12-15 DIAGNOSIS — R059 Cough, unspecified: Secondary | ICD-10-CM

## 2016-12-15 DIAGNOSIS — I10 Essential (primary) hypertension: Secondary | ICD-10-CM | POA: Diagnosis not present

## 2016-12-15 LAB — BASIC METABOLIC PANEL
Anion gap: 8 (ref 5–15)
BUN: 22 mg/dL — AB (ref 6–20)
CALCIUM: 10 mg/dL (ref 8.9–10.3)
CO2: 23 mmol/L (ref 22–32)
CREATININE: 1.02 mg/dL — AB (ref 0.44–1.00)
Chloride: 104 mmol/L (ref 101–111)
GFR calc Af Amer: >60 mL/min (ref >60)
GLUCOSE: 148 mg/dL — AB (ref 65–99)
Potassium: 5.3 mmol/L — ABNORMAL HIGH (ref 3.5–5.1)
Sodium: 135 mmol/L (ref 135–145)

## 2016-12-15 LAB — CBC WITH DIFFERENTIAL/PLATELET
Basophils Absolute: 0.1 10*3/uL (ref 0–0.1)
Basophils Relative: 1 %
EOS PCT: 3 %
Eosinophils Absolute: 0.3 10*3/uL (ref 0–0.7)
HEMATOCRIT: 34.1 % — AB (ref 35.0–47.0)
Hemoglobin: 11.2 g/dL — ABNORMAL LOW (ref 12.0–16.0)
LYMPHS PCT: 17 %
Lymphs Abs: 2 10*3/uL (ref 1.0–3.6)
MCH: 25.6 pg — ABNORMAL LOW (ref 26.0–34.0)
MCHC: 32.7 g/dL (ref 32.0–36.0)
MCV: 78.2 fL — AB (ref 80.0–100.0)
MONO ABS: 0.7 10*3/uL (ref 0.2–0.9)
MONOS PCT: 6 %
NEUTROS ABS: 9.1 10*3/uL — AB (ref 1.4–6.5)
Neutrophils Relative %: 75 %
PLATELETS: 461 10*3/uL — AB (ref 150–440)
RBC: 4.36 MIL/uL (ref 3.80–5.20)
RDW: 16.6 % — AB (ref 11.5–14.5)
WBC: 12.2 10*3/uL — ABNORMAL HIGH (ref 3.6–11.0)

## 2016-12-15 LAB — TROPONIN I: Troponin I: <0.03 ng/mL (ref <0.03)

## 2016-12-15 LAB — GLUCOSE, CAPILLARY: Glucose-Capillary: 148 mg/dL — ABNORMAL HIGH (ref 65–99)

## 2016-12-15 MED ORDER — SODIUM CHLORIDE 0.9 % IV BOLUS (SEPSIS)
500.0000 mL | Freq: Once | INTRAVENOUS | Status: AC
  Administered 2016-12-15: 500 mL via INTRAVENOUS

## 2016-12-15 MED ORDER — ALBUTEROL SULFATE (2.5 MG/3ML) 0.083% IN NEBU
5.0000 mg | INHALATION_SOLUTION | Freq: Once | RESPIRATORY_TRACT | Status: AC
  Administered 2016-12-15: 5 mg via RESPIRATORY_TRACT
  Filled 2016-12-15: qty 6

## 2016-12-15 MED ORDER — METOPROLOL TARTRATE 25 MG PO TABS
25.0000 mg | ORAL_TABLET | Freq: Once | ORAL | Status: AC
  Administered 2016-12-15: 25 mg via ORAL
  Filled 2016-12-15: qty 1

## 2016-12-15 MED ORDER — ALBUTEROL SULFATE HFA 108 (90 BASE) MCG/ACT IN AERS: 2.0000 | INHALATION_SPRAY | Freq: Four times a day (QID) | RESPIRATORY_TRACT | 2 refills | Status: DC | PRN

## 2016-12-15 MED ORDER — IOPAMIDOL (ISOVUE-370) INJECTION 76%
75.0000 mL | Freq: Once | INTRAVENOUS | Status: AC | PRN
  Administered 2016-12-15: 75 mL via INTRAVENOUS

## 2016-12-15 MED ORDER — LORATADINE 10 MG PO TABS: 10.0000 mg | ORAL_TABLET | Freq: Every day | ORAL | 2 refills | Status: DC

## 2016-12-15 NOTE — ED Provider Notes (Addendum)
[facility] Emergency Department Provider Note  ____________________________________________   I have reviewed the triage vital signs and the nursing notes.   HISTORY  Chief Complaint Shortness of Breath and Cough    HPI Patricia [name] is a 51 y.o. female with a history of anemia, arthritis, and states that after surgery she has had a aspirational event but not other time. Patient states that she woke up in the night and felt like she was gagging on her spit, and since that time she's had a slight cough. Patient was also having some mild cough and wheeze last week from "pollen" to have seasonal allergies. She denies a fever or chills or chest pain. She has chronic leg pain from prior surgery, does not feel that she has any increased edema but states she does not walk very much as a result. No personal have family history of PE or DVT, and patient has had no chest pain. Symptoms started around 4:00 this morning. Does have a history of acid indigestion as well. Patient states she is very anxious about all this.     Past Medical History:  Diagnosis Date  . Anemia   . Arthritis   . Complication of anesthesia 12/13/2015   aspiration after breathing tube removed after several surgeries  . Depression   . Diabetes mellitus without complication ([place])   . GERD (gastroesophageal reflux disease)   . Hyperlipidemia   . Hypertension   . Infection 2012   right knee infection, required arthroscopy, long term antibiotic treatment, aspiration therapy    Patient Active Problem List   Diagnosis Date Noted  . Anxiety disorder 10/31/2016  . Hyperlipidemia 10/31/2016  . Hyperlipidemia associated with type 2 diabetes mellitus ([place]) 10/31/2016  . S/P total knee arthroplasty, right 10/31/2016  . Primary osteoarthritis of right knee 08/08/2016  . History of septic arthritis 11/30/2015  . Hypertension 09/05/2015  . BPPV (benign paroxysmal positional vertigo) 09/05/2015  .  Insomnia 09/05/2015  . Controlled type 2 diabetes mellitus with neuropathy ([place]) 09/05/2015    Past Surgical History:  Procedure Laterality Date  . CHOLECYSTECTOMY    . KNEE ARTHROSCOPY Right 11/30/2015   Procedure: ARTHROSCOPY KNEE;  Surgeon: Hessie Knows, MD;  Location: [facility] ORS;  Service: Orthopedics;  Laterality: Right;  . KNEE ARTHROSCOPY Right 12/13/2015   Procedure: ARTHROSCOPY KNEE AND DEBRIDEMENT;  Surgeon: Hessie Knows, MD;  Location: [facility] ORS;  Service: Orthopedics;  Laterality: Right;  . MRSA abdominal wall    . Right knee arthroscopic    . TOTAL KNEE ARTHROPLASTY Right 08/08/2016   Procedure: TOTAL KNEE ARTHROPLASTY;  Surgeon: Hessie Knows, MD;  Location: [facility] ORS;  Service: Orthopedics;  Laterality: Right;  . TUBAL LIGATION  1997    Prior to Admission medications   Medication Sig Start Date End Date Taking? Authorizing Provider  acetaminophen (TYLENOL 8 HOUR ARTHRITIS PAIN) 650 MG CR tablet Take 650 mg by mouth every 8 (eight) hours as needed for pain.   Yes Historical Provider, MD  aspirin EC 81 MG tablet Take 81 mg by mouth daily.    Yes Historical Provider, MD  atorvastatin (LIPITOR) 10 MG tablet Take 1 tablet (10 mg total) by mouth daily. 10/31/16  Yes Alexander J Karamalegos, DO  famotidine (TH FAMOTIDINE 10) 10 MG tablet Take 10 mg by mouth 2 (two) times daily.    Yes Historical Provider, MD  FLUoxetine (PROZAC) 40 MG capsule Take 1 capsule (40 mg total) by mouth daily. 10/31/16  Yes Olin Hauser,  DO  glimepiride (AMARYL) 4 MG tablet Take 1 tablet (4 mg total) by mouth 2 (two) times daily. 10/31/16  Yes Alexander Devin Going, DO  Insulin Glargine (TOUJEO SOLOSTAR) 300 UNIT/ML SOPN Inject 40-60 Units into the skin 2 (two) times daily. 20 units in the morning and 40 units at bedtime 10/31/16  Yes Alexander J Karamalegos, DO  lisinopril (PRINIVIL,ZESTRIL) 20 MG tablet Take 1 tablet (20 mg total) by mouth daily. 12/03/16  Yes Olin Hauser, DO  metFORMIN  (GLUCOPHAGE) 1000 MG tablet Take 1 tablet (1,000 mg total) by mouth 2 (two) times daily with a meal. 10/31/16  Yes Olin Hauser, DO  metoprolol tartrate (LOPRESSOR) 25 MG tablet Take 1 tablet (25 mg total) by mouth 2 (two) times daily. 10/31/16  Yes Alexander Devin Going, DO  blood glucose meter kit and supplies KIT Dispense based on patient and insurance preference. Use up to four times daily as directed. (FOR ICD-9 250.00, 250.01). 09/05/15   Amy Lauren Krebs, NP  glucose blood (ONE TOUCH ULTRA TEST) test strip CHECK BLOOD GLUCOSE UP TO FOUR TIMES DAILY AS DIRECTED 10/28/16   Olin Hauser, DO  Insulin Pen Needle (RELION PEN NEEDLES) 31G X 6 MM MISC 1 each by Does not apply route daily. Use with Toujeo insulin as directed. 10/29/16   Devonne Doughty Karamalegos, DO  ONETOUCH DELICA LANCETS 82N MISC USE TO CHECK BLOOD GLUCOSE UP TO FOUR TIMES DAILY AS DIRECTED 12/18/15   Amy Overton Mam, NP    Allergies Patient has no known allergies.  Family History  Problem Relation Age of Onset  . Diabetes Mother   . CAD Mother   . Diabetes Father   . Hyperlipidemia Father     Social History Social History  Substance Use Topics  . Smoking status: Never Smoker  . Smokeless tobacco: Never Used  . Alcohol use No     Comment: rare    Review of Systems Constitutional: No fever/chills Eyes: No visual changes. ENT: No sore throat. No stiff neck no neck pain Cardiovascular: Denies chest pain. Respiratory: Positive shortness of breath. Gastrointestinal:   no vomiting.  No diarrhea.  No constipation. Genitourinary: Negative for dysuria. Musculoskeletal: Negative lower extremity swelling Skin: Negative for rash. Neurological: Negative for severe headaches, focal weakness or numbness. 10-point ROS otherwise negative.  ____________________________________________   PHYSICAL EXAM:  VITAL SIGNS: ED Triage Vitals  Enc Vitals Group     BP 12/15/16 1203 122/83     Pulse Rate 12/15/16  1203 (!) 115     Resp 12/15/16 1203 18     Temp 12/15/16 1203 98.9 F (37.2 C)     Temp Source 12/15/16 1203 Oral     SpO2 12/15/16 1203 99 %     Weight 12/15/16 1204 200 lb (90.7 kg)     Height 12/15/16 1204 '5\' 6"'$  (1.676 m)     Head Circumference --      Peak Flow --      Pain Score --      Pain Loc --      Pain Edu? --      Excl. in [place]? --     Constitutional: Alert and oriented. Well appearing and in no acute distress. Eyes: Conjunctivae are normal. PERRL. EOMI. Head: Atraumatic. Nose: No congestion/rhinnorhea. Mouth/Throat: Mucous membranes are moist.  Oropharynx non-erythematous. Neck: No stridor.   Nontender with no meningismus Cardiovascular: Normal rate, regular rhythm. Grossly normal heart sounds.  Good peripheral circulation. Respiratory: Normal respiratory effort.  No retractions. Wheeze noted no rales no rhonchi no accessory muscle use. Abdominal: Soft and nontender. No distention. No guarding no rebound Back:  There is no focal tenderness or step off.  there is no midline tenderness there are no lesions noted. there is no CVA tenderness Musculoskeletal: No lower extremity tenderness, no upper extremity tenderness. No joint effusions, no DVT signs strong distal pulses no edema Neurologic:  Normal speech and language. No gross focal neurologic deficits are appreciated.  Skin:  Skin is warm, dry and intact. No rash noted. Psychiatric: Mood and affect are normal. Speech and behavior are normal.  ____________________________________________   LABS (all labs ordered are listed, but only abnormal results are displayed)  Labs Reviewed  CBC WITH DIFFERENTIAL/PLATELET - Abnormal; Notable for the following:       Result Value   WBC 12.2 (*)    Hemoglobin 11.2 (*)    HCT 34.1 (*)    MCV 78.2 (*)    MCH 25.6 (*)    RDW 16.6 (*)    Platelets 461 (*)    Neutro Abs 9.1 (*)    All other components within normal limits  BASIC METABOLIC PANEL - Abnormal; Notable for the  following:    Potassium 5.3 (*)    Glucose, Bld 148 (*)    BUN 22 (*)    Creatinine, Ser 1.02 (*)    All other components within normal limits  TROPONIN I   ____________________________________________  EKG  I personally interpreted any EKGs ordered by me or triage Sinus tach rate 113 no acute estimation acute ST depression normal axis, nonspecific ST changes ____________________________________________  RADIOLOGY  I reviewed any imaging ordered by me or triage that were performed during my shift and, if possible, patient and/or family made aware of any abnormal findings. ____________________________________________   PROCEDURES  Procedure(s) performed: None  Procedures  Critical Care performed: None  ____________________________________________   INITIAL IMPRESSION / ASSESSMENT AND PLAN / ED COURSE  Pertinent labs & imaging results that were available during my care of the patient were reviewed by me and considered in my medical decision making (see chart for details).  Patient with cough and wheeze. She did have slight wheeze which is completely cleared with neb and she feels much better. Heart rate is slightly up but it is likely also secondary to the nebulizer treatment. Heart rate is 108 at this time. Patient is somewhat anxious. Patient made aware of all findings including lung nodules. CT scan was obtained because of somewhat vague symptoms of shortness of breath associated with very low motility secondary to her baseline problems with her ambulation from her knee issues. CT is negative for PE or pneumonia. 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 She is clear to auscultation at this time with no evidence of pneumonitis or pneumonia. We will discharge her home. I don't take at about her indicated at this time. We will  however keep her on albuterol and return precautions and follow-up with been given and understood  ----------------------------------------- 6:00 PM on 12/15/2016 -----------------------------------------  Patient using her telephone in no acute distress. Her rate is mildly elevated but again patient states she was on metoprolol every day and has not taken her daily dose of metoprolol. This certainly cause a little bit around tachycardia. We will giver her home with Toprol continue  IV fluids we'll send a second cardiac enzyme and if all that is reassuring as anticipated we'll discharge her    ____________________________________________   FINAL CLINICAL IMPRESSION(S) / ED DIAGNOSES  Final diagnoses:  None      This chart was dictated using voice recognition software.  Despite best efforts to proofread,  errors can occur which can change meaning.      Schuyler Amor, MD 12/15/16 [place], MD 12/15/16 [id number]

## 2016-12-15 NOTE — Discharge Instructions (Addendum)
We are reassured by your findings here. CT does not show any evidence of pneumonia or pulmonary embolism/blood clot in your lungs. We do note some lung nodules, we do ask that you follow up with her primary care doctor for this, he may require repeat CT scan in 12 months. CT scan shows some atherosclerosis, and we do asked if follow-up with cardiology for this although it is not likely causing your  symptoms today. If you have any new or worrisome symptoms such as chest pain, shortness of breath, fever, weakness or feel worse in any way we ask that you  return to the emergency department. We asked that he follow up with GI medicine for your "gagging" issues. If at anytime you  feel worse please return to the emergency department

## 2016-12-15 NOTE — ED Triage Notes (Signed)
Pt here from home via [facility] after breathing difficulty. MD office thinks pt aspirated during sleep, EMS reports pt's room air sats 94% on RA. Pt has hx of aspiration pneumonia. EMS reports VSS, has not taken BP medication yet. Pt on 2L via [place] at present time.

## 2016-12-15 NOTE — ED Notes (Signed)
Patient ambulated to restroom without assistance. Returned to bed and placed on monitor.

## 2016-12-18 ENCOUNTER — Ambulatory Visit (INDEPENDENT_AMBULATORY_CARE_PROVIDER_SITE_OTHER): Payer: BC Managed Care – PPO | Admitting: Family Medicine

## 2016-12-18 ENCOUNTER — Encounter: Payer: Self-pay | Admitting: Family Medicine

## 2016-12-18 VITALS — BP 116/81 | HR 91 | Temp 98.6°F | Resp 16 | Ht 66.0 in | Wt 206.0 lb

## 2016-12-18 DIAGNOSIS — Z8719 Personal history of other diseases of the digestive system: Secondary | ICD-10-CM | POA: Diagnosis not present

## 2016-12-18 DIAGNOSIS — R198 Other specified symptoms and signs involving the digestive system and abdomen: Secondary | ICD-10-CM | POA: Diagnosis not present

## 2016-12-18 DIAGNOSIS — K224 Dyskinesia of esophagus: Secondary | ICD-10-CM | POA: Insufficient documentation

## 2016-12-18 DIAGNOSIS — R05 Cough: Secondary | ICD-10-CM

## 2016-12-18 DIAGNOSIS — R058 Other specified cough: Secondary | ICD-10-CM

## 2016-12-18 DIAGNOSIS — K219 Gastro-esophageal reflux disease without esophagitis: Secondary | ICD-10-CM | POA: Diagnosis not present

## 2016-12-18 MED ORDER — OMEPRAZOLE 20 MG PO CPDR: 20.0000 mg | DELAYED_RELEASE_CAPSULE | Freq: Two times a day (BID) | ORAL | 5 refills | Status: DC

## 2016-12-18 NOTE — Assessment & Plan Note (Signed)
Suspected chronic GERD with hiatal hernia may be complicating recent cough/wheezing gagging episode, and may actually be underlying cause altogether. Still has some refractory symptoms on H2 blocker BID. - History not suggestive of PUD - CT Chest showed some abnormal esophageal dysmotility with fluid level and concern for GERD - Concern with significant prior history of aspiration pneumonia s/p extubation in past  Plan: 1. Stop Famotidine. Switch to Omeprazole PPI  BID for 2-4 weeks, discussed daily vs BID dosing, considered  daily but she preferred to keep BID, 30 min prior to 1st meal, unfortunately she eats later in day 2. Diet modifications reduce GERD, continue elevated head of bed with pillows 3. Follow-up 4 weeks - consider switch to daily  or alternative PPI if needed. Additionally advised her that I do recommend GI referral for further diagnostic work-up with likely swallowing study vs esophageal manometry vs EGD, as this may be related to her chronic aspiration problems in past. She prefers to hold off on referral to another specialist for now, discuss at next visit 4 weeks - Return criteria given

## 2016-12-18 NOTE — Assessment & Plan Note (Addendum)
Suspect may be underlying factor with chronic GERD / hiatal hernia, causing Patricia [name] acute gagging episode with productive cough. - CT Chest showed some abnormal esophageal dysmotility with fluid level and concern for GERD - Concern with significant prior history of aspiration pneumonia s/p extubation in past  Plan: 1. See A&P GERD  Additionally advised Patricia [name] that I do recommend GI referral for further diagnostic work-up with likely swallowing study vs esophageal manometry vs EGD, as this may be related to Patricia [name] chronic aspiration problems in past. She prefers to hold off on referral to another specialist for now, discuss at next visit 4 weeks - Return criteria given

## 2016-12-18 NOTE — Progress Notes (Signed)
Subjective:    Patient ID: Patricia [name], female    DOB: 05-Jul-1966, 51 y.o.   MRN: [id number]  Patricia [name] is a 51 y.o. female presenting on 12/18/2016 for Hospitalization Follow-up (SOB getting improved litlle sore throat little white mucus)   HPI   ED FOLLOW-UP COUGHING / WHEEZING / GAGGING EPISODE / GERD: - Recent interval history, patient presented to our office [facility] on 12/15/16 for an acute appointment, however she was found to be more acutely ill with persistent coughing, gagging and thicker productive sputum affecting her breathing in the waiting room. Her vitals were appropriate, but given acute respiratory difficulty, EMS was called, she did improve before leaving office, and was taken to [facility] ED. I have reviewed ED chart, she had initial work-up with labs and EKG, troponin was negative, chest X-ray was unremarkable, concerns with some slight wheezing and coughing, wheeze cleared with albuterol nebulizer, still had tachycardia, and proceeded to Chest CT to rule out PNA / aspiration vs PE, results were negative for acute pulmonary etiology, did show some esophageal dysmotility vs GERD, incidental small pulm nodules, she significantly improved and was discharged home with Albuterol inhaler, also given loratadine rx, note mentions GI referral but this was not established. - Today patient here in follow-up overall doing much better. Still describes some thicker secretions with productive cough, infrequent now, voice is still a little hoarse, but her breathing has been fine without further severe gagging episode since. She has used albuterol 2 puffs in AM for past few days not repeating dose, with some improvement. - She reviews an extensive history of related diagnoses, such as hiatal hernia dx in age 41s, treated with PPI prilosec at that time, did not have any surgery. Also has had chronic problem with aspiration, mostly related to intubation/extubation with prior surgeries under general  anesthesia, had aspiration PNA complicating prior surgery 9 years ago, and again recurrence from other surgery, most recently with revision R knee anesthesia was aware and suctioned her stomach prior to and she did not have this problem - Admits to significant GERD symptoms now seem to be controlled on H2 blocker famotidine BID OTC, but has had episodes of waking up with "vomit" taste in mouth, some regurgitation and silent LPR symptoms, sleeps with 4-5 pillows at night for head elevation - Recently CBG 200-300s, caution on Prednisone, she is not interested in this right now - Denies any worsening dyspnea, chest pain or tightness, nausea, vomiting, abdominal pain, gagging choking, dysphagia with food/drink  Social History  Substance Use Topics  . Smoking status: Never Smoker  . Smokeless tobacco: Never Used  . Alcohol use No     Comment: rare    Review of Systems Per HPI unless specifically indicated above     Objective:    BP 116/81   Pulse 91   Temp 98.6 F (37 C) (Oral)   Resp 16   Ht '5\' 6"'$  (1.676 m)   Wt 206 lb (93.4 kg)   LMP 07/10/2015 (Approximate)   SpO2 98%   BMI 33.25 kg/m   Wt Readings from Last 3 Encounters:  12/18/16 206 lb (93.4 kg)  12/15/16 200 lb (90.7 kg)  10/31/16 210 lb (95.3 kg)    Physical Exam  Constitutional: She is oriented to person, place, and time. She appears well-developed and well-nourished. No distress.  Improved appearance today, mostly well appearing, comfortable, cooperative. Voice is slightly hoarse and occasional coughing but minimal, compared to recent evaluation in office.  HENT:  Head: Normocephalic and atraumatic.  Mouth/Throat: Oropharynx is clear and moist.  Frontal / maxillary sinuses non-tender. Nares patent without purulence or edema. Bilateral TMs clear without erythema, effusion or bulging. Oropharynx clear without erythema, exudates, edema or asymmetry - some excessive oropharyngeal secretions but no evidence of thicker sputum  or posterior pharyngeal drainage.  Eyes: Conjunctivae are normal. Right eye exhibits no discharge. Left eye exhibits no discharge.  Neck: Normal range of motion. Neck supple. No thyromegaly present.  Cardiovascular: Normal rate, regular rhythm, normal heart sounds and intact distal pulses.   No murmur heard. Pulmonary/Chest: Effort normal. No respiratory distress. She has no wheezes. She has no rales.  Significantly improved air movement. Some occasional coughing during deeper breathing. Mild coarse transmitted upper airway sound that clears with cough. No overt wheezing. Speaks full sentences.  Abdominal: Soft. She exhibits no distension. There is no tenderness.  Lymphadenopathy:    She has no cervical adenopathy.  Neurological: She is alert and oriented to person, place, and time.  Skin: Skin is warm and dry. No rash noted. She is not diaphoretic. No erythema.  Psychiatric: She has a normal mood and affect. Her behavior is normal.  Nursing note and vitals reviewed.   I have personally reviewed the following lab results from 12/15/16.  Results for orders placed or performed during the hospital encounter of 12/15/16  CBC with Differential  Result Value Ref Range   WBC 12.2 (H) 3.6 - 11.0 K/uL   RBC 4.36 3.80 - 5.20 MIL/uL   Hemoglobin 11.2 (L) 12.0 - 16.0 g/dL   HCT 34.1 (L) 35.0 - 47.0 %   MCV 78.2 (L) 80.0 - 100.0 fL   MCH 25.6 (L) 26.0 - 34.0 pg   MCHC 32.7 32.0 - 36.0 g/dL   RDW 16.6 (H) 11.5 - 14.5 %   Platelets 461 (H) 150 - 440 K/uL   Neutrophils Relative % 75 %   Neutro Abs 9.1 (H) 1.4 - 6.5 K/uL   Lymphocytes Relative 17 %   Lymphs Abs 2.0 1.0 - 3.6 K/uL   Monocytes Relative 6 %   Monocytes Absolute 0.7 0.2 - 0.9 K/uL   Eosinophils Relative 3 %   Eosinophils Absolute 0.3 0 - 0.7 K/uL   Basophils Relative 1 %   Basophils Absolute 0.1 0 - 0.1 K/uL  Basic metabolic panel  Result Value Ref Range   Sodium 135 135 - 145 mmol/L   Potassium 5.3 (H) 3.5 - 5.1 mmol/L    Chloride 104 101 - 111 mmol/L   CO2 23 22 - 32 mmol/L   Glucose, Bld 148 (H) 65 - 99 mg/dL   BUN 22 (H) 6 - 20 mg/dL   Creatinine, Ser 1.02 (H) 0.44 - 1.00 mg/dL   Calcium 10.0 8.9 - 10.3 mg/dL   GFR calc non Af Amer >60 >60 mL/min   GFR calc Af Amer >60 >60 mL/min   Anion gap 8 5 - 15  Troponin I  Result Value Ref Range   Troponin I <0.03 <0.03 ng/mL  Troponin I  Result Value Ref Range   Troponin I <0.03 <0.03 ng/mL  Glucose, capillary  Result Value Ref Range   Glucose-Capillary 148 (H) 65 - 99 mg/dL      Assessment & Plan:   Problem List Items Addressed This Visit    History of hiatal hernia   GERD (gastroesophageal reflux disease) - Primary    Suspected chronic GERD with hiatal hernia may be complicating recent cough/wheezing gagging  episode, and may actually be underlying cause altogether. Still has some refractory symptoms on H2 blocker BID. - History not suggestive of PUD - CT Chest showed some abnormal esophageal dysmotility with fluid level and concern for GERD - Concern with significant prior history of aspiration pneumonia s/p extubation in past  Plan: 1. Stop Famotidine. Switch to Omeprazole PPI '20mg'$  BID for 2-4 weeks, discussed daily vs BID dosing, considered '40mg'$  daily but she preferred to keep BID, 30 min prior to 1st meal, unfortunately she eats later in day 2. Diet modifications reduce GERD, continue elevated head of bed with pillows 3. Follow-up 4 weeks - consider switch to daily '40mg'$  or alternative PPI if needed. Additionally advised her that I do recommend GI referral for further diagnostic work-up with likely swallowing study vs esophageal manometry vs EGD, as this may be related to her chronic aspiration problems in past. She prefers to hold off on referral to another specialist for now, discuss at next visit 4 weeks - Return criteria given      Relevant Medications   omeprazole (PRILOSEC) 20 MG capsule   Esophageal dysmotility    Suspect may be underlying  factor with chronic GERD / hiatal hernia, causing her acute gagging episode with productive cough. - CT Chest showed some abnormal esophageal dysmotility with fluid level and concern for GERD - Concern with significant prior history of aspiration pneumonia s/p extubation in past  Plan: 1. See A&P GERD  Additionally advised her that I do recommend GI referral for further diagnostic work-up with likely swallowing study vs esophageal manometry vs EGD, as this may be related to her chronic aspiration problems in past. She prefers to hold off on referral to another specialist for now, discuss at next visit 4 weeks - Return criteria given       Other Visit Diagnoses    Gagging episode       Productive cough      Stable respiratory status today, no overt wheezing or focal abnormality. No identified PE or infiltrate on CT - Likely upper airway vs esophageal / GERD etiology  Plan: 1. Offered prednisone burst given likely some upper airway edema and prior wheezing, still productive cough, agree to hold for now given concern with CBGs DM 2. Increase use of Albuterol since helping for now, 2 puffs q 4-6 hours for 2-3 days then PRN 3. Follow-up as needed, return criteria given       I have reviewed the discharge medication list, and have reconciled the current and discharge medications today.   Current Outpatient Prescriptions:  .  acetaminophen (TYLENOL 8 HOUR ARTHRITIS PAIN) 650 MG CR tablet, Take 650 mg by mouth every 8 (eight) hours as needed for pain., Disp: , Rfl:  .  albuterol (PROVENTIL HFA;VENTOLIN HFA) 108 (90 Base) MCG/ACT inhaler, Inhale 2 puffs into the lungs every 6 (six) hours as needed for wheezing or shortness of breath., Disp: 1 Inhaler, Rfl: 2 .  aspirin EC 81 MG tablet, Take 81 mg by mouth daily. , Disp: , Rfl:  .  atorvastatin (LIPITOR) 10 MG tablet, Take 1 tablet (10 mg total) by mouth daily., Disp: 90 tablet, Rfl: 3 .  blood glucose meter kit and supplies KIT, Dispense  based on patient and insurance preference. Use up to four times daily as directed. (FOR ICD-9 250.00, 250.01)., Disp: 1 each, Rfl: 0 .  FLUoxetine (PROZAC) 40 MG capsule, Take 1 capsule (40 mg total) by mouth daily., Disp: 90 capsule, Rfl: 3 .  glimepiride (AMARYL) 4 MG tablet, Take 1 tablet (4 mg total) by mouth 2 (two) times daily., Disp: 180 tablet, Rfl: 3 .  glucose blood (ONE TOUCH ULTRA TEST) test strip, CHECK BLOOD GLUCOSE UP TO FOUR TIMES DAILY AS DIRECTED, Disp: 100 each, Rfl: 11 .  Insulin Glargine (TOUJEO SOLOSTAR) 300 UNIT/ML SOPN, Inject 40-60 Units into the skin 2 (two) times daily. 20 units in the morning and 40 units at bedtime, Disp: 9 pen, Rfl: 3 .  Insulin Pen Needle (RELION PEN NEEDLES) 31G X 6 MM MISC, 1 each by Does not apply route daily. Use with Toujeo insulin as directed., Disp: 100 each, Rfl: 11 .  lisinopril (PRINIVIL,ZESTRIL) 20 MG tablet, Take 1 tablet (20 mg total) by mouth daily., Disp: 90 tablet, Rfl: 3 .  loratadine (CLARITIN) 10 MG tablet, Take 1 tablet (10 mg total) by mouth daily., Disp: 30 tablet, Rfl: 2 .  metFORMIN (GLUCOPHAGE) 1000 MG tablet, Take 1 tablet (1,000 mg total) by mouth 2 (two) times daily with a meal., Disp: 180 tablet, Rfl: 3 .  metoprolol tartrate (LOPRESSOR) 25 MG tablet, Take 1 tablet (25 mg total) by mouth 2 (two) times daily., Disp: 180 tablet, Rfl: 3 .  ONETOUCH DELICA LANCETS 85F MISC, USE TO CHECK BLOOD GLUCOSE UP TO FOUR TIMES DAILY AS DIRECTED, Disp: 100 each, Rfl: 0 .  omeprazole (PRILOSEC) 20 MG capsule, Take 1 capsule (20 mg total) by mouth 2 (two) times daily before a meal., Disp: 60 capsule, Rfl: 5 .  traMADol (ULTRAM) 50 MG tablet, , Disp: , Rfl:    Meds ordered this encounter       . omeprazole (PRILOSEC) 20 MG capsule    Sig: Take 1 capsule (20 mg total) by mouth 2 (two) times daily before a meal.    Dispense:  60 capsule    Refill:  5    Follow up plan: Return in about 4 weeks (around 01/15/2017) for Esophagus, GERD,  Cough.  Nobie Putnam, [facility] 12/18/2016, 5:38 PM

## 2016-12-18 NOTE — Patient Instructions (Signed)
Thank you for coming in to clinic today.  1. Concern for possible esophageal dysmotility with concern that this may be related to acid reflux or hiatal hernia. Maybe this is the cause of your choking gagging, and not actually related to cough or lungs. Also with your prior history of aspiration.  Stop Famotidine twice daily  Your symptoms sound most consistent with Silent Reflux or (Laryngopharyngeal Reflux), this is similar to Acid Reflux (or GERD) but usually involves the Throat and has a variety of symptoms including a "globus sensation", or air bubble or pressure in throat. Commonly occurs in patients who have had some symptoms of traditional heartburn before, but this can occur even when not eating spicy foods.  Start Omeprazole  take one capsule 30 min before first meal of day and 15-30 min before dinner, same time every day for at least 2-4 weeks, and we may consider continuing twice daily dosing or reduce to ONCE daily, either 20 to   - Avoid spicy, greasy, fried foods, also things like caffeine, dark chocolate, peppermint can worsen - Avoid large meals and late night snacks, also do not go more than 4-5 hours without a snack or meal (not eating will worsen reflux symptoms due to stomach acid)  If the problem improves but keeps coming back, we can discuss higher dose or longer course at next visit.  If symptoms are worsening, persistent symptoms despite treatment or develop esophageal or abdominal pain, unable to swallow solids or liquids, nausea, vomiting, fever/chills, or unintentional weight loss / no appetite, please follow-up sooner or seek more immediate medical attention.  Continue Albuterol inhaler 2 puffs every 4-6 hours for next 2-3 days, maybe longer or use as needed.  If not improving or have more cough, wheezing, shortness of breath - notify office and we can consider prednisone burst 5 days.  If can't breath or acute change of symptoms need to go directly to  hospital ED again.  In future we can discuss referral to GI for further evaluation and likely esophageal testing and possible EGD scope  Please schedule a follow-up appointment with Dr. Althea Charon in 4 weeks for Esophagus, GERD, Cough  If you have any other questions or concerns, please feel free to call the clinic or send a message through MyChart. You may also schedule an earlier appointment if necessary.  Saralyn Pilar, DO [facility], [place]

## 2017-01-07 ENCOUNTER — Other Ambulatory Visit: Payer: Self-pay

## 2017-02-02 ENCOUNTER — Inpatient Hospital Stay
Admission: EM | Admit: 2017-02-02 | Discharge: 2017-02-06 | DRG: 871 | Disposition: A | Discharge status: Home / Self Care | Payer: BC Managed Care – PPO | Attending: Internal Medicine | Admitting: Internal Medicine

## 2017-02-02 ENCOUNTER — Ambulatory Visit (INDEPENDENT_AMBULATORY_CARE_PROVIDER_SITE_OTHER): Payer: BC Managed Care – PPO | Admitting: Nurse Practitioner

## 2017-02-02 ENCOUNTER — Encounter: Payer: Self-pay | Admitting: Nurse Practitioner

## 2017-02-02 ENCOUNTER — Encounter: Payer: Self-pay | Admitting: Emergency Medicine

## 2017-02-02 ENCOUNTER — Emergency Department: Payer: BC Managed Care – PPO

## 2017-02-02 VITALS — BP 135/86 | HR 132 | Temp 101.9°F | Ht 66.0 in | Wt 199.4 lb

## 2017-02-02 DIAGNOSIS — I10 Essential (primary) hypertension: Secondary | ICD-10-CM | POA: Diagnosis present

## 2017-02-02 DIAGNOSIS — Z79899 Other long term (current) drug therapy: Secondary | ICD-10-CM | POA: Diagnosis not present

## 2017-02-02 DIAGNOSIS — Z8614 Personal history of Methicillin resistant Staphylococcus aureus infection: Secondary | ICD-10-CM

## 2017-02-02 DIAGNOSIS — A419 Sepsis, unspecified organism: Secondary | ICD-10-CM | POA: Diagnosis present

## 2017-02-02 DIAGNOSIS — G039 Meningitis, unspecified: Secondary | ICD-10-CM

## 2017-02-02 DIAGNOSIS — B349 Viral infection, unspecified: Secondary | ICD-10-CM

## 2017-02-02 DIAGNOSIS — R5081 Fever presenting with conditions classified elsewhere: Secondary | ICD-10-CM | POA: Diagnosis not present

## 2017-02-02 DIAGNOSIS — Z96651 Presence of right artificial knee joint: Secondary | ICD-10-CM | POA: Diagnosis present

## 2017-02-02 DIAGNOSIS — B029 Zoster without complications: Secondary | ICD-10-CM | POA: Diagnosis present

## 2017-02-02 DIAGNOSIS — F419 Anxiety disorder, unspecified: Secondary | ICD-10-CM | POA: Diagnosis present

## 2017-02-02 DIAGNOSIS — E1142 Type 2 diabetes mellitus with diabetic polyneuropathy: Secondary | ICD-10-CM | POA: Diagnosis present

## 2017-02-02 DIAGNOSIS — F329 Major depressive disorder, single episode, unspecified: Secondary | ICD-10-CM | POA: Diagnosis present

## 2017-02-02 DIAGNOSIS — B021 Zoster meningitis: Secondary | ICD-10-CM | POA: Diagnosis present

## 2017-02-02 DIAGNOSIS — R5381 Other malaise: Secondary | ICD-10-CM

## 2017-02-02 DIAGNOSIS — G030 Nonpyogenic meningitis: Secondary | ICD-10-CM | POA: Diagnosis present

## 2017-02-02 DIAGNOSIS — K210 Gastro-esophageal reflux disease with esophagitis: Secondary | ICD-10-CM | POA: Diagnosis present

## 2017-02-02 DIAGNOSIS — M1990 Unspecified osteoarthritis, unspecified site: Secondary | ICD-10-CM | POA: Diagnosis present

## 2017-02-02 DIAGNOSIS — E785 Hyperlipidemia, unspecified: Secondary | ICD-10-CM | POA: Diagnosis present

## 2017-02-02 DIAGNOSIS — E11649 Type 2 diabetes mellitus with hypoglycemia without coma: Secondary | ICD-10-CM | POA: Diagnosis present

## 2017-02-02 DIAGNOSIS — Z794 Long term (current) use of insulin: Secondary | ICD-10-CM

## 2017-02-02 DIAGNOSIS — E860 Dehydration: Secondary | ICD-10-CM | POA: Diagnosis not present

## 2017-02-02 DIAGNOSIS — R1114 Bilious vomiting: Secondary | ICD-10-CM | POA: Diagnosis not present

## 2017-02-02 DIAGNOSIS — Z7982 Long term (current) use of aspirin: Secondary | ICD-10-CM

## 2017-02-02 LAB — COMPREHENSIVE METABOLIC PANEL
ALT: 18 U/L (ref 14–54)
ANION GAP: 9 (ref 5–15)
AST: 20 U/L (ref 15–41)
Albumin: 3.9 g/dL (ref 3.5–5.0)
Alkaline Phosphatase: 76 U/L (ref 38–126)
BUN: 16 mg/dL (ref 6–20)
CHLORIDE: 103 mmol/L (ref 101–111)
CO2: 24 mmol/L (ref 22–32)
Calcium: 9.5 mg/dL (ref 8.9–10.3)
Creatinine, Ser: 0.92 mg/dL (ref 0.44–1.00)
Glucose, Bld: 222 mg/dL — ABNORMAL HIGH (ref 65–99)
POTASSIUM: 4.6 mmol/L (ref 3.5–5.1)
Sodium: 136 mmol/L (ref 135–145)
Total Bilirubin: 0.6 mg/dL (ref 0.3–1.2)
Total Protein: 7.6 g/dL (ref 6.5–8.1)

## 2017-02-02 LAB — PROTEIN, CSF: TOTAL PROTEIN, CSF: 96 mg/dL — AB (ref 15–45)

## 2017-02-02 LAB — CBC WITH DIFFERENTIAL/PLATELET
BASOS ABS: 0 10*3/uL (ref 0–0.1)
Basophils Relative: 1 %
Eosinophils Absolute: 0.1 10*3/uL (ref 0–0.7)
Eosinophils Relative: 1 %
HCT: 30.7 % — ABNORMAL LOW (ref 35.0–47.0)
HEMOGLOBIN: 10.2 g/dL — AB (ref 12.0–16.0)
LYMPHS ABS: 1 10*3/uL (ref 1.0–3.6)
LYMPHS PCT: 14 %
MCH: 25.3 pg — ABNORMAL LOW (ref 26.0–34.0)
MCHC: 33.3 g/dL (ref 32.0–36.0)
MCV: 76 fL — AB (ref 80.0–100.0)
Monocytes Absolute: 0.4 10*3/uL (ref 0.2–0.9)
Monocytes Relative: 5 %
NEUTROS ABS: 5.4 10*3/uL (ref 1.4–6.5)
NEUTROS PCT: 79 %
PLATELETS: 369 10*3/uL (ref 150–440)
RBC: 4.04 MIL/uL (ref 3.80–5.20)
RDW: 16.6 % — ABNORMAL HIGH (ref 11.5–14.5)
WBC: 6.8 10*3/uL (ref 3.6–11.0)

## 2017-02-02 LAB — PATHOLOGIST SMEAR REVIEW

## 2017-02-02 LAB — CSF CELL COUNT WITH DIFFERENTIAL
Eosinophils, CSF: 1 %
LYMPHS CSF: 87 %
Monocyte-Macrophage-Spinal Fluid: 8 %
RBC COUNT CSF: 0 /mm3 (ref 0–3)
SEGMENTED NEUTROPHILS-CSF: 8 %
Tube #: 3
WBC, CSF: 264 /mm3 (ref 0–5)

## 2017-02-02 LAB — URINALYSIS, COMPLETE (UACMP) WITH MICROSCOPIC
BILIRUBIN URINE: NEGATIVE
Bacteria, UA: NONE SEEN
GLUCOSE, UA: 50 mg/dL — AB
Hgb urine dipstick: NEGATIVE
KETONES UR: 20 mg/dL — AB
NITRITE: NEGATIVE
PH: 5 (ref 5.0–8.0)
PROTEIN: 30 mg/dL — AB
Specific Gravity, Urine: 1.023 (ref 1.005–1.030)

## 2017-02-02 LAB — GLUCOSE, CAPILLARY
GLUCOSE-CAPILLARY: 147 mg/dL — AB (ref 65–99)
Glucose-Capillary: 202 mg/dL — ABNORMAL HIGH (ref 65–99)

## 2017-02-02 LAB — LIPASE, BLOOD: LIPASE: 20 U/L (ref 11–51)

## 2017-02-02 LAB — LACTIC ACID, PLASMA: LACTIC ACID, VENOUS: 0.9 mmol/L (ref 0.5–1.9)

## 2017-02-02 LAB — TROPONIN I

## 2017-02-02 LAB — GLUCOSE, CSF: GLUCOSE CSF: 87 mg/dL — AB (ref 40–70)

## 2017-02-02 MED ORDER — IBUPROFEN 400 MG PO TABS
400.0000 mg | ORAL_TABLET | Freq: Four times a day (QID) | ORAL | Status: DC | PRN
  Administered 2017-02-02 – 2017-02-06 (×8): 400 mg via ORAL
  Filled 2017-02-02 (×9): qty 1

## 2017-02-02 MED ORDER — METOPROLOL TARTRATE 25 MG PO TABS
25.0000 mg | ORAL_TABLET | Freq: Two times a day (BID) | ORAL | Status: DC
  Administered 2017-02-02 – 2017-02-06 (×9): 25 mg via ORAL
  Filled 2017-02-02 (×9): qty 1

## 2017-02-02 MED ORDER — INSULIN DETEMIR 100 UNIT/ML ~~LOC~~ SOLN
24.0000 [IU] | Freq: Two times a day (BID) | SUBCUTANEOUS | Status: DC
  Administered 2017-02-02 – 2017-02-03 (×3): 24 [IU] via SUBCUTANEOUS
  Filled 2017-02-02 (×4): qty 0.24

## 2017-02-02 MED ORDER — INSULIN ASPART 100 UNIT/ML ~~LOC~~ SOLN
0.0000 [IU] | Freq: Three times a day (TID) | SUBCUTANEOUS | Status: DC
  Administered 2017-02-03 – 2017-02-05 (×4): 3 [IU] via SUBCUTANEOUS
  Administered 2017-02-05: 5 [IU] via SUBCUTANEOUS
  Administered 2017-02-05: 2 [IU] via SUBCUTANEOUS
  Administered 2017-02-06: 3 [IU] via SUBCUTANEOUS
  Administered 2017-02-06: 5 [IU] via SUBCUTANEOUS
  Filled 2017-02-02: qty 1
  Filled 2017-02-02: qty 3
  Filled 2017-02-02: qty 1
  Filled 2017-02-02: qty 3
  Filled 2017-02-02: qty 2
  Filled 2017-02-02 (×4): qty 1

## 2017-02-02 MED ORDER — POLYETHYLENE GLYCOL 3350 17 G PO PACK: 17.0000 g | PACK | Freq: Every day | ORAL | Status: DC | PRN

## 2017-02-02 MED ORDER — VANCOMYCIN HCL 10 G IV SOLR
1250.0000 mg | Freq: Two times a day (BID) | INTRAVENOUS | Status: DC
  Administered 2017-02-03: 1250 mg via INTRAVENOUS
  Filled 2017-02-02 (×3): qty 1250

## 2017-02-02 MED ORDER — SODIUM CHLORIDE 0.9 % IV SOLN
INTRAVENOUS | Status: DC
  Administered 2017-02-02 – 2017-02-03 (×2): via INTRAVENOUS

## 2017-02-02 MED ORDER — PROCHLORPERAZINE EDISYLATE 5 MG/ML IJ SOLN
10.0000 mg | Freq: Once | INTRAMUSCULAR | Status: AC
  Administered 2017-02-02: 10 mg via INTRAVENOUS
  Filled 2017-02-02: qty 2

## 2017-02-02 MED ORDER — ONDANSETRON HCL 4 MG/2ML IJ SOLN: 4.0000 mg | Freq: Four times a day (QID) | INTRAMUSCULAR | Status: DC | PRN

## 2017-02-02 MED ORDER — SODIUM CHLORIDE 0.9 % IV SOLN: 1000.0000 mL | INTRAVENOUS | Status: DC

## 2017-02-02 MED ORDER — ACETAMINOPHEN 650 MG RE SUPP: 650.0000 mg | Freq: Four times a day (QID) | RECTAL | Status: DC | PRN

## 2017-02-02 MED ORDER — SODIUM CHLORIDE 0.9 % IV BOLUS (SEPSIS)
1000.0000 mL | Freq: Once | INTRAVENOUS | Status: AC
  Administered 2017-02-02: 1000 mL via INTRAVENOUS

## 2017-02-02 MED ORDER — KETOROLAC TROMETHAMINE 30 MG/ML IJ SOLN
30.0000 mg | Freq: Four times a day (QID) | INTRAMUSCULAR | Status: DC | PRN
  Administered 2017-02-03: 30 mg via INTRAVENOUS
  Filled 2017-02-02: qty 1

## 2017-02-02 MED ORDER — INSULIN ASPART 100 UNIT/ML ~~LOC~~ SOLN
0.0000 [IU] | Freq: Every day | SUBCUTANEOUS | Status: DC
  Administered 2017-02-02 – 2017-02-05 (×3): 2 [IU] via SUBCUTANEOUS
  Filled 2017-02-02: qty 1
  Filled 2017-02-02: qty 2
  Filled 2017-02-02: qty 1

## 2017-02-02 MED ORDER — DOXYCYCLINE HYCLATE 100 MG PO TABS
100.0000 mg | ORAL_TABLET | Freq: Two times a day (BID) | ORAL | Status: DC
  Administered 2017-02-02 – 2017-02-06 (×9): 100 mg via ORAL
  Filled 2017-02-02 (×9): qty 1

## 2017-02-02 MED ORDER — DEXTROSE 5 % IV SOLN: 2.0000 g | Freq: Two times a day (BID) | INTRAVENOUS | Status: DC

## 2017-02-02 MED ORDER — PANTOPRAZOLE SODIUM 40 MG PO TBEC
40.0000 mg | DELAYED_RELEASE_TABLET | Freq: Two times a day (BID) | ORAL | Status: DC
  Administered 2017-02-02 – 2017-02-06 (×7): 40 mg via ORAL
  Filled 2017-02-02 (×7): qty 1

## 2017-02-02 MED ORDER — LORATADINE 10 MG PO TABS: 10.0000 mg | ORAL_TABLET | Freq: Every day | ORAL | Status: DC | PRN

## 2017-02-02 MED ORDER — ACETAMINOPHEN 325 MG PO TABS
650.0000 mg | ORAL_TABLET | Freq: Four times a day (QID) | ORAL | Status: DC | PRN
  Administered 2017-02-02 – 2017-02-05 (×8): 650 mg via ORAL
  Filled 2017-02-02 (×8): qty 2

## 2017-02-02 MED ORDER — LISINOPRIL 20 MG PO TABS
20.0000 mg | ORAL_TABLET | Freq: Every day | ORAL | Status: DC
  Administered 2017-02-02 – 2017-02-06 (×5): 20 mg via ORAL
  Filled 2017-02-02 (×5): qty 1

## 2017-02-02 MED ORDER — DEXTROSE 5 % IV SOLN
2.0000 g | Freq: Two times a day (BID) | INTRAVENOUS | Status: DC
  Administered 2017-02-03: 2 g via INTRAVENOUS
  Filled 2017-02-02 (×3): qty 2

## 2017-02-02 MED ORDER — DEXTROSE 5 % IV SOLN
2.0000 g | Freq: Once | INTRAVENOUS | Status: AC
  Administered 2017-02-02: 2 g via INTRAVENOUS
  Filled 2017-02-02: qty 2

## 2017-02-02 MED ORDER — ALBUTEROL SULFATE (2.5 MG/3ML) 0.083% IN NEBU: 2.5000 mg | INHALATION_SOLUTION | RESPIRATORY_TRACT | Status: DC | PRN

## 2017-02-02 MED ORDER — FENTANYL CITRATE (PF) 100 MCG/2ML IJ SOLN
INTRAMUSCULAR | Status: AC
  Administered 2017-02-02: 50 ug
  Filled 2017-02-02: qty 2

## 2017-02-02 MED ORDER — VANCOMYCIN HCL 10 G IV SOLR
1500.0000 mg | Freq: Once | INTRAVENOUS | Status: AC
  Administered 2017-02-02: 1500 mg via INTRAVENOUS
  Filled 2017-02-02: qty 1500

## 2017-02-02 MED ORDER — GLIMEPIRIDE 2 MG PO TABS
4.0000 mg | ORAL_TABLET | Freq: Two times a day (BID) | ORAL | Status: DC
  Administered 2017-02-02 – 2017-02-03 (×2): 4 mg via ORAL
  Filled 2017-02-02 (×2): qty 2

## 2017-02-02 MED ORDER — FLUOXETINE HCL 20 MG PO CAPS
40.0000 mg | ORAL_CAPSULE | Freq: Every day | ORAL | Status: DC
  Administered 2017-02-02 – 2017-02-06 (×5): 40 mg via ORAL
  Filled 2017-02-02 (×5): qty 2

## 2017-02-02 MED ORDER — BUPIVACAINE HCL (PF) 0.5 % IJ SOLN
30.0000 mL | Freq: Once | INTRAMUSCULAR | Status: AC
  Administered 2017-02-02: 30 mL
  Filled 2017-02-02: qty 30

## 2017-02-02 MED ORDER — DEXTROSE 5 % IV SOLN
10.0000 mg/kg | Freq: Once | INTRAVENOUS | Status: AC
  Administered 2017-02-02: 715 mg via INTRAVENOUS
  Filled 2017-02-02: qty 14.3

## 2017-02-02 MED ORDER — DEXTROSE 5 % IV SOLN
600.0000 mg | Freq: Three times a day (TID) | INTRAVENOUS | Status: DC
  Administered 2017-02-02 – 2017-02-05 (×9): 600 mg via INTRAVENOUS
  Filled 2017-02-02 (×11): qty 12

## 2017-02-02 MED ORDER — ONDANSETRON HCL 4 MG PO TABS
4.0000 mg | ORAL_TABLET | Freq: Four times a day (QID) | ORAL | Status: DC | PRN
  Administered 2017-02-04: 4 mg via ORAL
  Filled 2017-02-02: qty 1

## 2017-02-02 MED ORDER — ENOXAPARIN SODIUM 40 MG/0.4ML ~~LOC~~ SOLN
40.0000 mg | SUBCUTANEOUS | Status: DC
  Administered 2017-02-02 – 2017-02-05 (×4): 40 mg via SUBCUTANEOUS
  Filled 2017-02-02 (×4): qty 0.4

## 2017-02-02 MED ORDER — METFORMIN HCL 500 MG PO TABS
1000.0000 mg | ORAL_TABLET | Freq: Two times a day (BID) | ORAL | Status: DC
  Administered 2017-02-02 – 2017-02-03 (×2): 1000 mg via ORAL
  Filled 2017-02-02 (×2): qty 2

## 2017-02-02 MED ORDER — DIPHENHYDRAMINE HCL 50 MG/ML IJ SOLN
25.0000 mg | Freq: Once | INTRAMUSCULAR | Status: AC
  Administered 2017-02-02: 25 mg via INTRAVENOUS
  Filled 2017-02-02: qty 1

## 2017-02-02 MED ORDER — DEXAMETHASONE SODIUM PHOSPHATE 10 MG/ML IJ SOLN
10.0000 mg | Freq: Once | INTRAMUSCULAR | Status: AC
  Administered 2017-02-02: 10 mg via INTRAVENOUS
  Filled 2017-02-02: qty 1

## 2017-02-02 MED ORDER — ATORVASTATIN CALCIUM 10 MG PO TABS
10.0000 mg | ORAL_TABLET | Freq: Every day | ORAL | Status: DC
  Administered 2017-02-02 – 2017-02-06 (×5): 10 mg via ORAL
  Filled 2017-02-02 (×5): qty 1

## 2017-02-02 MED ORDER — ASPIRIN EC 81 MG PO TBEC
81.0000 mg | DELAYED_RELEASE_TABLET | Freq: Every day | ORAL | Status: DC
  Administered 2017-02-02 – 2017-02-06 (×5): 81 mg via ORAL
  Filled 2017-02-02 (×5): qty 1

## 2017-02-02 NOTE — Progress Notes (Addendum)
Pt arrived from Er via stretcher at approx 1615. Pt ambulated to bed with staff assist. Pt is alert and oriented, stated that her head and her back hurt, pt found to be febrile at 101.9 and received tylenol po. Pt quickly fell asleep, admission questions had to be asked in several episodes. Pt is on room air, lungs clear bilat. Under her l breast, pt has some flat scabbed areas with a new blister forming. Pt stated that this area does not hurt or burn. Pt denied difficulty voiding,ppp, no edema noted. piv #22 intact to top of r hand. Pt has received medications, and ns hung per md order. Pt able to independently order her dinner. Insulin coverage held due to pt's poor po intake. Pt oriented to room and call bell. SRx2, call bell in reach. Prior to pt arrival, this Clinical research associatewriter received report from ED, who stated that this patient will require airborne precautions due to diagnosis of Shingles. Pt was placed on contact and airborne precautions on ariival, with education regarding this given to patient.

## 2017-02-02 NOTE — ED Notes (Signed)
Dr Roxan Hockeyobinson notified of CSF results called by lab

## 2017-02-02 NOTE — H&P (Signed)
[facility] [place] NAME: Patricia [name]    MR#:  [id number]  DATE OF BIRTH:  10-07-1965  DATE OF ADMISSION:  02/02/2017  PRIMARY CARE PHYSICIAN: Olin Hauser, DO   REQUESTING/REFERRING PHYSICIAN: Dr. Quentin Cornwall  CHIEF COMPLAINT:   Chief Complaint  Patient presents with  . Fever  . Headache  . Abdominal Pain  . Back Pain  . Neck Pain    HISTORY OF PRESENT ILLNESS:  Patricia [name]  is a 51 y.o. female with a known history of Hypertension, diabetes, MRSA infection in the past presents to the emergency room complaining of feeling weak, fatigued and headaches since Friday. Patient started having headache, photophobia and neck pain. Later starting a myalgias and diffuse pain in the back all over. Today she went to urgent care was found to have fever 101.9, tachycardia and sepsis and sent to the emergency room. Here her CSF shows elevated WBC of 264 with no RBCs. Protein elevated at 95.  Patient is being admitted for meningitis. She did notice a rash 2 days back on left chest with no pain or itching.  PAST MEDICAL HISTORY:   Past Medical History:  Diagnosis Date  . Anemia   . Arthritis   . Complication of anesthesia 12/13/2015   aspiration after breathing tube removed after several surgeries  . Depression   . Diabetes mellitus without complication ([place])   . GERD (gastroesophageal reflux disease)   . Hyperlipidemia   . Hypertension   . Infection 2012   right knee infection, required arthroscopy, long term antibiotic treatment, aspiration therapy    PAST SURGICAL HISTORY:   Past Surgical History:  Procedure Laterality Date  . CHOLECYSTECTOMY    . KNEE ARTHROSCOPY Right 11/30/2015   Procedure: ARTHROSCOPY KNEE;  Surgeon: Hessie Knows, MD;  Location: [facility] ORS;  Service: Orthopedics;  Laterality: Right;  . KNEE ARTHROSCOPY Right 12/13/2015   Procedure: ARTHROSCOPY KNEE AND DEBRIDEMENT;  Surgeon: Hessie Knows, MD;  Location:  [facility] ORS;  Service: Orthopedics;  Laterality: Right;  . MRSA abdominal wall    . Right knee arthroscopic    . TOTAL KNEE ARTHROPLASTY Right 08/08/2016   Procedure: TOTAL KNEE ARTHROPLASTY;  Surgeon: Hessie Knows, MD;  Location: [facility] ORS;  Service: Orthopedics;  Laterality: Right;  . TUBAL LIGATION  1997    SOCIAL HISTORY:   Social History  Substance Use Topics  . Smoking status: Never Smoker  . Smokeless tobacco: Never Used  . Alcohol use No     Comment: rare    FAMILY HISTORY:   Family History  Problem Relation Age of Onset  . Diabetes Mother   . CAD Mother   . Diabetes Father   . Hyperlipidemia Father     DRUG ALLERGIES:  No Known Allergies  REVIEW OF SYSTEMS:   Review of Systems  Constitutional: Positive for chills, fever and malaise/fatigue.  HENT: Negative for sore throat.   Eyes: Positive for photophobia. Negative for blurred vision, double vision and pain.  Respiratory: Negative for cough, hemoptysis, shortness of breath and wheezing.   Cardiovascular: Negative for chest pain, palpitations, orthopnea and leg swelling.  Gastrointestinal: Positive for nausea. Negative for abdominal pain, constipation, diarrhea, heartburn and vomiting.  Genitourinary: Negative for dysuria and hematuria.  Musculoskeletal: Positive for back pain, myalgias and neck pain. Negative for joint pain.  Skin: Negative for rash.  Neurological: Positive for weakness. Negative for sensory change, speech change, focal weakness and headaches.  Endo/Heme/Allergies: Does not bruise/bleed easily.  Psychiatric/Behavioral: Negative for depression. The patient is not nervous/anxious.     MEDICATIONS AT HOME:   Prior to Admission medications   Medication Sig Start Date End Date Taking? Authorizing Provider  aspirin EC 81 MG tablet Take 81 mg by mouth daily.    Yes [provider]  atorvastatin (LIPITOR) 10 MG tablet Take 1 tablet (10 mg total) by mouth daily. 10/31/16  Yes Karamalegos,  Devonne Doughty, DO  FLUoxetine (PROZAC) 40 MG capsule Take 1 capsule (40 mg total) by mouth daily. 10/31/16  Yes Karamalegos, Devonne Doughty, DO  glimepiride (AMARYL) 4 MG tablet Take 1 tablet (4 mg total) by mouth 2 (two) times daily. 10/31/16  Yes Karamalegos, Devonne Doughty, DO  Insulin Pen Needle (RELION PEN NEEDLES) 31G X 6 MM MISC 1 each by Does not apply route daily. Use with [place] insulin as directed. 10/29/16  Yes Karamalegos, Devonne Doughty, DO  lisinopril (PRINIVIL,ZESTRIL) 20 MG tablet Take 1 tablet (20 mg total) by mouth daily. 12/03/16  Yes Karamalegos, Devonne Doughty, DO  loratadine (CLARITIN) 10 MG tablet Take 1 tablet (10 mg total) by mouth daily. 12/15/16 12/15/17 Yes Schuyler Amor, MD  metFORMIN (GLUCOPHAGE) 1000 MG tablet Take 1 tablet (1,000 mg total) by mouth 2 (two) times daily with a meal. 10/31/16  Yes Karamalegos, Devonne Doughty, DO  metoprolol tartrate (LOPRESSOR) 25 MG tablet Take 1 tablet (25 mg total) by mouth 2 (two) times daily. 10/31/16  Yes Karamalegos, Devonne Doughty, DO  omeprazole (PRILOSEC) 20 MG capsule Take 1 capsule (20 mg total) by mouth 2 (two) times daily before a meal. 12/18/16  Yes Karamalegos, Devonne Doughty, DO  acetaminophen (TYLENOL 8 HOUR ARTHRITIS PAIN) 650 MG CR tablet Take 650 mg by mouth every 8 (eight) hours as needed for pain.    [provider]  albuterol (PROVENTIL HFA;VENTOLIN HFA) 108 (90 Base) MCG/ACT inhaler Inhale 2 puffs into the lungs every 6 (six) hours as needed for wheezing or shortness of breath. 12/15/16   Schuyler Amor, MD  blood glucose meter kit and supplies KIT Dispense based on patient and insurance preference. Use up to four times daily as directed. (FOR ICD-9 250.00, 250.01). 09/05/15   Krebs, Amy Lauren, NP  glucose blood (ONE TOUCH ULTRA TEST) test strip CHECK BLOOD GLUCOSE UP TO FOUR TIMES DAILY AS DIRECTED 10/28/16   Parks Ranger, Devonne Doughty, DO  Insulin Glargine (TOUJEO SOLOSTAR) 300 UNIT/ML SOPN Inject 40-60 Units into the skin 2 (two) times  daily. 20 units in the morning and 40 units at bedtime 10/31/16   Karamalegos, Alexander J, DO  ONETOUCH DELICA LANCETS 25K MISC USE TO CHECK BLOOD GLUCOSE UP TO FOUR TIMES DAILY AS DIRECTED 12/18/15   Krebs, Genevie Cheshire, NP     VITAL SIGNS:  Blood pressure (!) 158/91, pulse (!) 141, temperature 100 F (37.8 C), resp. rate (!) 24, height '5\' 6"'$  (1.676 m), weight 90.3 kg (199 lb), last menstrual period 07/10/2015, SpO2 96 %.  PHYSICAL EXAMINATION:  Physical Exam  GENERAL:  51 y.o.-year-old patient lying in the bed with no acute distress.  EYES: Pupils equal, round, reactive to light and accommodation. No scleral icterus. Extraocular muscles intact.  HEENT: Head atraumatic, normocephalic. Oropharynx and nasopharynx clear. No oropharyngeal erythema, moist oral mucosa  NECK:  Supple, no jugular venous distention. No thyroid enlargement, no tenderness.  LUNGS: Normal breath sounds bilaterally, no wheezing, rales, rhonchi. No use of accessory muscles of respiration.  CARDIOVASCULAR: S1, S2 normal. No murmurs, rubs, or gallops.  ABDOMEN:  Soft, nontender, nondistended. Bowel sounds present. No organomegaly or mass.  EXTREMITIES: No pedal edema, cyanosis, or clubbing. + 2 pedal & radial pulses b/l.   NEUROLOGIC: Cranial nerves II through XII are intact. No focal Motor or sensory deficits appreciated b/l PSYCHIATRIC: The patient is alert and oriented x 3. Good affect.  SKIN: No obvious rash, lesion, or ulcer.  Tenderness along neck and back.  LABORATORY PANEL:   CBC  Recent Labs Lab 02/02/17 1012  WBC 6.8  HGB 10.2*  HCT 30.7*  PLT 369   ------------------------------------------------------------------------------------------------------------------  Chemistries   Recent Labs Lab 02/02/17 1012  NA 136  K 4.6  CL 103  CO2 24  GLUCOSE 222*  BUN 16  CREATININE 0.92  CALCIUM 9.5  AST 20  ALT 18  ALKPHOS 76  BILITOT 0.6    ------------------------------------------------------------------------------------------------------------------  Cardiac Enzymes  Recent Labs Lab 02/02/17 1012  TROPONINI <0.03   ------------------------------------------------------------------------------------------------------------------  RADIOLOGY:  Dg Chest Port 1 View  Result Date: 02/02/2017 CLINICAL DATA:  Migraine for 2 days with fever, abdominal pain, nausea and back pain. EXAM: PORTABLE CHEST 1 VIEW COMPARISON:  CT chest and chest radiograph 12/15/2016. FINDINGS: Trachea is midline. Heart size stable. Lungs are low in volume but clear. No pleural fluid. IMPRESSION: Low lung volumes.  No acute findings. Electronically Signed   By: Lorin Picket M.D.   On: 02/02/2017 10:16     IMPRESSION AND PLAN:   * Acute meningitis, likely bacterial With sepsis. No encephalitis IV fluids He does have shingles on the left chest area. Will start ceftriaxone, vancomycin and acyclovir. She did get 1 dose of Decadron in the emergency room. Wait for CSF cultures. Add HSV PCR. Consult infectious disease. Droplet precautions.  * Shingles, left chest. On IV acyclovir.  * Diabetes mellitus. Add sliding scale insulin. She did receive 1 dose of Decadron. The likely have uncontrolled blood sugars.  * Hypertension. Continue medications.  * DVT prophylaxis with Lovenox  All the records are reviewed and case discussed with ED provider. Management plans discussed with the patient, family and they are in agreement.  CODE STATUS: FULL CODE  TOTAL TIME TAKING CARE OF THIS PATIENT: 40 minutes.   Hillary Bow R M.D on 02/02/2017 at 2:43 PM  Between 7am to 6pm - Pager - [phone]  After 6pm go to [email or web address] - password [email or web address] [facility]  Office  [phone]  CC: Primary care physician; Olin Hauser, DO  Note: This dictation was prepared with Dragon dictation along with smaller phrase  technology. Any transcriptional errors that result from this process are unintentional.

## 2017-02-02 NOTE — Progress Notes (Signed)
I have reviewed this encounter including the documentation in this note and/or discussed this patient with the provider, Wilhelmina McardleLauren Kennedy, AGPCNP-BC. I am certifying that I agree with the content of this note as supervising physician.  Saralyn PilarAlexander Karamalegos, DO [facility] 02/02/2017, 9:35 AM

## 2017-02-02 NOTE — Patient Instructions (Addendum)
Ms. Patricia [name], Thank you for coming in to clinic today.  1. Please travel to the Emergency Room now. - You need IV fluids because you are unable to drink and keep anything on your stomach. - This is likely a viral infection.    Please schedule a follow-up appointment with Wilhelmina McardleLauren Clifford Coudriet, AGNP to Return 5-7 days if symptoms worsen or fail to improve.  If you have any other questions or concerns, please feel free to call the clinic or send a message through MyChart. You may also schedule an earlier appointment if necessary.  Wilhelmina McardleLauren Jaynia Fendley, DNP, AGNP-BC Adult Gerontology Nurse Practitioner [facility], [facility]

## 2017-02-02 NOTE — Consult Note (Signed)
[facility] Infectious Disease     Reason for Consult:Meningitis, aseptic   Referring Physician: Boykin Reaper Date of Admission:  02/02/2017   Active Problems:   Meningitis   HPI: Patricia [name] is a 51 y.o. female admitted with several days of increasing HA, weakness and fatigue. She works at an [employer]. She was found at [facility] to have fever 101.9 and tachycardia and was sent to ED. LP done. Noted also to have shingles like eruption over L chest.  On admit wbc 8, temp 101.9. No recent travels or sick contacts. No known tick bites.  Past Medical History:  Diagnosis Date  . Anemia   . Arthritis   . Complication of anesthesia 12/13/2015   aspiration after breathing tube removed after several surgeries  . Depression   . Diabetes mellitus without complication ([place])   . GERD (gastroesophageal reflux disease)   . Hyperlipidemia   . Hypertension   . Infection 2012   right knee infection, required arthroscopy, long term antibiotic treatment, aspiration therapy   Past Surgical History:  Procedure Laterality Date  . CHOLECYSTECTOMY    . KNEE ARTHROSCOPY Right 11/30/2015   Procedure: ARTHROSCOPY KNEE;  Surgeon: Hessie Knows, MD;  Location: [facility] ORS;  Service: Orthopedics;  Laterality: Right;  . KNEE ARTHROSCOPY Right 12/13/2015   Procedure: ARTHROSCOPY KNEE AND DEBRIDEMENT;  Surgeon: Hessie Knows, MD;  Location: [facility] ORS;  Service: Orthopedics;  Laterality: Right;  . MRSA abdominal wall    . Right knee arthroscopic    . TOTAL KNEE ARTHROPLASTY Right 08/08/2016   Procedure: TOTAL KNEE ARTHROPLASTY;  Surgeon: Hessie Knows, MD;  Location: [facility] ORS;  Service: Orthopedics;  Laterality: Right;  . TUBAL LIGATION  1997   Social History  Substance Use Topics  . Smoking status: Never Smoker  . Smokeless tobacco: Never Used  . Alcohol use No     Comment: rare   Family History  Problem Relation Age of Onset  . Diabetes Mother   . CAD Mother   . Diabetes Father   . Hyperlipidemia  Father     Allergies: No Known Allergies  Current antibiotics: Antibiotics Given (last 72 hours)    Date/Time Action Medication Dose Rate   02/02/17 1136 New Bag/Given   acyclovir (ZOVIRAX) 715 mg in dextrose 5 % 150 mL IVPB 715 mg 164.3 mL/hr   02/02/17 1433 New Bag/Given   cefTRIAXone (ROCEPHIN) 2 g in dextrose 5 % 50 mL IVPB 2 g 100 mL/hr   02/02/17 1518 New Bag/Given   vancomycin (VANCOCIN) 1,500 mg in sodium chloride 0.9 % 500 mL IVPB 1,500 mg 250 mL/hr      MEDICATIONS: . aspirin EC  81 mg Oral Daily  . atorvastatin  10 mg Oral Daily  . doxycycline  100 mg Oral Q12H  . enoxaparin (LOVENOX) injection  40 mg Subcutaneous Q24H  . FLUoxetine  40 mg Oral Daily  . glimepiride  4 mg Oral BID  . insulin aspart  0-15 Units Subcutaneous TID WC  . insulin aspart  0-5 Units Subcutaneous QHS  . insulin detemir  24 Units Subcutaneous BID  . lisinopril  20 mg Oral Daily  . metFORMIN  1,000 mg Oral BID WC  . metoprolol tartrate  25 mg Oral BID  . pantoprazole  40 mg Oral BID AC    Review of Systems - 11 systems reviewed and negative per HPI   OBJECTIVE: Temp:  [100 F (37.8 C)-101.9 F (38.8 C)] 101.9 F (38.8 C) (06/11 1619) Pulse  Rate:  [72-141] 115 (06/11 1619) Resp:  [16-24] 17 (06/11 1619) BP: (128-158)/(74-91) 156/84 (06/11 1619) SpO2:  [96 %-98 %] 97 % (06/11 1619) Weight:  [90.3 kg (199 lb)-90.4 kg (199 lb 6.4 oz)] 90.3 kg (199 lb) (06/11 1000) Physical Exam  Constitutional:  Ill appearing febrile oriented to person, place, and time.  HENT: [place]/AT, PERRLA, no scleral icterus Mouth/Throat: Oropharynx is clear and moist. No oropharyngeal exudate.  Cardiovascular: tachy, regu Pulmonary/Chest: Effort normal and breath sounds normal. No respiratory distress.  has no wheezes.  Neck =mild pain with flexion but supple  Abdominal: Soft. Bowel sounds are normal.  exhibits no distension. There is no tenderness.  Lymphadenopathy: no cervical adenopathy. No axillary  adenopathy Neurological: alert and oriented to person, place, and time.  Skin: L chest has discrete lesions with erythema and wrap aound back in dermatomal pattern Psychiatric: a normal mood and affect.  behavior is normal.    LABS: Results for orders placed or performed during the hospital encounter of 02/02/17 (from the past 48 hour(s))  Lactic acid, plasma     Status: None   Collection Time: 02/02/17 10:12 AM  Result Value Ref Range   Lactic Acid, Venous 0.9 0.5 - 1.9 mmol/L  Comprehensive metabolic panel     Status: Abnormal   Collection Time: 02/02/17 10:12 AM  Result Value Ref Range   Sodium 136 135 - 145 mmol/L   Potassium 4.6 3.5 - 5.1 mmol/L   Chloride 103 101 - 111 mmol/L   CO2 24 22 - 32 mmol/L   Glucose, Bld 222 (H) 65 - 99 mg/dL   BUN 16 6 - 20 mg/dL   Creatinine, Ser 8.72 0.44 - 1.00 mg/dL   Calcium 9.5 8.9 - 84.7 mg/dL   Total Protein 7.6 6.5 - 8.1 g/dL   Albumin 3.9 3.5 - 5.0 g/dL   AST 20 15 - 41 U/L   ALT 18 14 - 54 U/L   Alkaline Phosphatase 76 38 - 126 U/L   Total Bilirubin 0.6 0.3 - 1.2 mg/dL   GFR calc non Af Amer >60 >60 mL/min   GFR calc Af Amer >60 >60 mL/min    Comment: (NOTE) The eGFR has been calculated using the CKD EPI equation. This calculation has not been validated in all clinical situations. eGFR's persistently <60 mL/min signify possible Chronic Kidney Disease.    Anion gap 9 5 - 15  Troponin I     Status: None   Collection Time: 02/02/17 10:12 AM  Result Value Ref Range   Troponin I <0.03 <0.03 ng/mL  CBC WITH DIFFERENTIAL     Status: Abnormal   Collection Time: 02/02/17 10:12 AM  Result Value Ref Range   WBC 6.8 3.6 - 11.0 K/uL   RBC 4.04 3.80 - 5.20 MIL/uL   Hemoglobin 10.2 (L) 12.0 - 16.0 g/dL   HCT 34.6 (L) 57.4 - 96.3 %   MCV 76.0 (L) 80.0 - 100.0 fL   MCH 25.3 (L) 26.0 - 34.0 pg   MCHC 33.3 32.0 - 36.0 g/dL   RDW 90.2 (H) 28.0 - 03.6 %   Platelets 369 150 - 440 K/uL   Neutrophils Relative % 79 %   Neutro Abs 5.4 1.4 -  6.5 K/uL   Lymphocytes Relative 14 %   Lymphs Abs 1.0 1.0 - 3.6 K/uL   Monocytes Relative 5 %   Monocytes Absolute 0.4 0.2 - 0.9 K/uL   Eosinophils Relative 1 %   Eosinophils Absolute 0.1 0 -  0.7 K/uL   Basophils Relative 1 %   Basophils Absolute 0.0 0 - 0.1 K/uL  Lipase, blood     Status: None   Collection Time: 02/02/17 10:12 AM  Result Value Ref Range   Lipase 20 11 - 51 U/L  Urinalysis, Complete w Microscopic     Status: Abnormal   Collection Time: 02/02/17 10:12 AM  Result Value Ref Range   Color, Urine YELLOW (A) YELLOW   APPearance HAZY (A) CLEAR   Specific Gravity, Urine 1.023 1.005 - 1.030   pH 5.0 5.0 - 8.0   Glucose, UA 50 (A) NEGATIVE mg/dL   Hgb urine dipstick NEGATIVE NEGATIVE   Bilirubin Urine NEGATIVE NEGATIVE   Ketones, ur 20 (A) NEGATIVE mg/dL   Protein, ur 30 (A) NEGATIVE mg/dL   Nitrite NEGATIVE NEGATIVE   Leukocytes, UA TRACE (A) NEGATIVE   RBC / HPF 0-5 0 - 5 RBC/hpf   WBC, UA 6-30 0 - 5 WBC/hpf   Bacteria, UA NONE SEEN NONE SEEN   Squamous Epithelial / LPF 6-30 (A) NONE SEEN  CSF cell count with differential     Status: Abnormal   Collection Time: 02/02/17 12:50 PM  Result Value Ref Range   Tube # 3    Color, CSF COLORLESS COLORLESS   Appearance, CSF CLEAR (A) CLEAR   RBC Count, CSF 0 0 - 3 /cu mm   WBC, CSF 264 (HH) 0 - 5 /cu mm    Comment: CRITICAL RESULT CALLED TO, READ BACK BY AND VERIFIED WITH: TERESA HUDSON 02/02/17 AT 1420 ALV    Segmented Neutrophils-CSF 8 %   Lymphs, CSF 87 %   Monocyte-Macrophage-Spinal Fluid 8 %   Eosinophils, CSF 1 %  Glucose, CSF     Status: Abnormal   Collection Time: 02/02/17 12:50 PM  Result Value Ref Range   Glucose, CSF 87 (H) 40 - 70 mg/dL  Protein, CSF     Status: Abnormal   Collection Time: 02/02/17 12:50 PM  Result Value Ref Range   Total  Protein, CSF 96 (H) 15 - 45 mg/dL  Pathologist smear review     Status: None   Collection Time: 02/02/17 12:50 PM  Result Value Ref Range   Path Review       Cerebrospinal fluid review is notable for increased inflammatory cells.  87 percent lympocytes, rare basophils and eosinophils, and 8 percent neutrophils. Tube 3 reviewed.  Dr. Excell Seltzer.  CSF culture with Stat gram stain     Status: None (Preliminary result)   Collection Time: 02/02/17 12:58 PM  Result Value Ref Range   Specimen Description CSF    Special Requests NONE    Gram Stain      WBC PRESENT,BOTH PMN AND MONONUCLEAR RBCS PRESENT NO ORGANISMS SEEN    Culture PENDING    Report Status PENDING    No components found for: ESR, C REACTIVE PROTEIN MICRO: Recent Results (from the past 720 hour(s))  CSF culture with Stat gram stain     Status: None (Preliminary result)   Collection Time: 02/02/17 12:58 PM  Result Value Ref Range Status   Specimen Description CSF  Final   Special Requests NONE  Final   Gram Stain   Final    WBC PRESENT,BOTH PMN AND MONONUCLEAR RBCS PRESENT NO ORGANISMS SEEN    Culture PENDING  Incomplete   Report Status PENDING  Incomplete    IMAGING: Dg Chest Port 1 View  Result Date: 02/02/2017 CLINICAL DATA:  Migraine for 2  days with fever, abdominal pain, nausea and back pain. EXAM: PORTABLE CHEST 1 VIEW COMPARISON:  CT chest and chest radiograph 12/15/2016. FINDINGS: Trachea is midline. Heart size stable. Lungs are low in volume but clear. No pleural fluid. IMPRESSION: Low lung volumes.  No acute findings. Electronically Signed   By: Lorin Picket M.D.   On: 02/02/2017 10:16    Assessment:   Patricia [name] is a 51 y.o. female with hx HTN DM admitted with neck pain fever photophobia and found to have shingles eruption as well as aseptic meningitis. LP with lymphocytic predominant wbc 264.  She works at an [employer] but no other sick contacts.  Findings consistent with aseptic meningitis. With differential being enteorvirus, VZV, HSV most likely.   Recommendations I have added on vzv and enteroviral PCR to CSF HSV and cx pending Contin acyclovir and  doxy. Can dc vanco and ceftriaxone. No need for steroids. WIll follow but if clinically improving and studies negative can dc in 1-2 days or oral valtrex and doxycyclin Thank you very much for allowing me to participate in the care of this patient. Please call with questions.   Cheral Marker. Ola Spurr, MD

## 2017-02-02 NOTE — Progress Notes (Signed)
Subjective:    Patient ID: Patricia AskewPhyllis J [name], female    DOB: 09/18/1965, 51 y.o.   MRN: [id number]  Patricia Askewhyllis J [name] is a 51 y.o. female presenting on 02/02/2017 for Headache (possible migraine, nausea, neck pain, back pain,)   HPI  Headache Back achy on left side a few days ago.  Yesterday onset of acute illness w/ chills, worsening back pain, neck pain/stiffness, and a bad headache - ache/pressure, nausea, cough (w/ increased head pressure w/ cough).    Headache gradually worsened for onset (not thunderclap).  Really cold yesterday (w/ two blankets).  Occasional rhinorrhea.  Nausea present constantly w/ inability to drink water and keep it down.  Has some vomiting when consuming liquid, none otherwise.  Denies sinus pressure/pain, sore throat., diarrhea and constipation.  Took ibuprofen 800 mg yesterday am and pm without relief.  No other sick contacts.  Social History  Substance Use Topics  . Smoking status: Never Smoker  . Smokeless tobacco: Never Used  . Alcohol use No     Comment: rare    Review of Systems Per HPI unless specifically indicated above     Objective:    BP 135/86 (BP Location: Right Arm, Patient Position: Sitting, Cuff Size: Large)   Pulse (!) 132   Temp (!) 101.9 F (38.8 C) (Oral)   Ht 5\' 6"  (1.676 m)   Wt 199 lb 6.4 oz (90.4 kg)   LMP 07/10/2015 (Approximate)   BMI 32.18 kg/m   Wt Readings from Last 3 Encounters:  02/02/17 199 lb 6.4 oz (90.4 kg)  12/18/16 206 lb (93.4 kg)  12/15/16 200 lb (90.7 kg)    Physical Exam  Constitutional: She is oriented to person, place, and time. She appears well-developed and well-nourished. She appears distressed.  HENT:  Head: Normocephalic and atraumatic.  Right Ear: Hearing, external ear and ear canal normal. Tympanic membrane is erythematous. A middle ear effusion is present.  Left Ear: Hearing, external ear and ear canal normal. Tympanic membrane is erythematous.  Nose: Nose normal. Right sinus  exhibits no maxillary sinus tenderness and no frontal sinus tenderness. Left sinus exhibits no maxillary sinus tenderness and no frontal sinus tenderness.  Mouth/Throat: Uvula is midline, oropharynx is clear and moist and mucous membranes are normal.  TM bilateral cloudy, dull appearance  Neck: JVD present. No tracheal deviation present.  Limited ROM of neck Negative brudzinski sign Positive kernig sign Positive straight leg raise bilateral    Cardiovascular: Regular rhythm, normal heart sounds and normal pulses.  Tachycardia present.  PMI is not displaced.   Pulmonary/Chest: Effort normal and breath sounds normal. No respiratory distress.  Abdominal: Soft. Bowel sounds are normal. She exhibits no distension. There is tenderness in the left upper quadrant and left lower quadrant. There is no rebound and no guarding.  No hepatosplenomegaly noted w/ scratch test  Bilious emesis in clinic.  Lymphadenopathy:    She has cervical adenopathy.  Neurological: She is alert and oriented to person, place, and time. She has normal strength. No cranial nerve deficit or sensory deficit.  Positive Kernig sign and straight leg raise. Positive Spurling test Negative Brudzinski sign Negative Romberg  Skin: Skin is warm and dry.  Psychiatric: She has a normal mood and affect. Her behavior is normal. Judgment and thought content normal.  Vitals reviewed.  Bilious emesis in clinic.  Results for orders placed or performed during the hospital encounter of 12/15/16  CBC with Differential  Result Value Ref Range   WBC 12.2 (  H) 3.6 - 11.0 K/uL   RBC 4.36 3.80 - 5.20 MIL/uL   Hemoglobin 11.2 (L) 12.0 - 16.0 g/dL   HCT 69.6 (L) 29.5 - 28.4 %   MCV 78.2 (L) 80.0 - 100.0 fL   MCH 25.6 (L) 26.0 - 34.0 pg   MCHC 32.7 32.0 - 36.0 g/dL   RDW 13.2 (H) 44.0 - 10.2 %   Platelets 461 (H) 150 - 440 K/uL   Neutrophils Relative % 75 %   Neutro Abs 9.1 (H) 1.4 - 6.5 K/uL   Lymphocytes Relative 17 %   Lymphs Abs 2.0  1.0 - 3.6 K/uL   Monocytes Relative 6 %   Monocytes Absolute 0.7 0.2 - 0.9 K/uL   Eosinophils Relative 3 %   Eosinophils Absolute 0.3 0 - 0.7 K/uL   Basophils Relative 1 %   Basophils Absolute 0.1 0 - 0.1 K/uL  Basic metabolic panel  Result Value Ref Range   Sodium 135 135 - 145 mmol/L   Potassium 5.3 (H) 3.5 - 5.1 mmol/L   Chloride 104 101 - 111 mmol/L   CO2 23 22 - 32 mmol/L   Glucose, Bld 148 (H) 65 - 99 mg/dL   BUN 22 (H) 6 - 20 mg/dL   Creatinine, Ser 7.25 (H) 0.44 - 1.00 mg/dL   Calcium 36.6 8.9 - 44.0 mg/dL   GFR calc non Af Amer >60 >60 mL/min   GFR calc Af Amer >60 >60 mL/min   Anion gap 8 5 - 15  Troponin I  Result Value Ref Range   Troponin I <0.03 <0.03 ng/mL  Troponin I  Result Value Ref Range   Troponin I <0.03 <0.03 ng/mL  Glucose, capillary  Result Value Ref Range   Glucose-Capillary 148 (H) 65 - 99 mg/dL      Assessment & Plan:   Problem List Items Addressed This Visit    None    Visit Diagnoses    Acute viral syndrome    -  Primary   Malaise       Bilious vomiting with nausea       Fever in other diseases       Dehydration     Pt w/ likely viral illness x 1-2 days.  Unable to hydrate r/t n&v.  Fever not responsive to ibuprofen.  Possible neurological source of infection w/ headache severity - non responsive to ibuprofen, positive kernig sign. Nausea constant, worsened w/ bilious vomiting after neurological tests performed.  Plan: 1. Pt refuses EMS transport.  Pt verbalized understanding w/ risks to drive self to ER.  Instructions given to patient to go to ER now - not urgent care.  [facility] ED or [facility] ED. 2. At minimum recommend IV fluids for dehydration. 3. Only antiemetic available is phenergan.  Pt will drive self to ER. No medications administered in clinic.      No orders of the defined types were placed in this encounter.     Follow up plan: Return 5-7 days if symptoms worsen or fail to improve & Go straight to ER.   [name], DNP, AGPCNP-BC Adult Gerontology Primary Care Nurse Practitioner [facility] 02/02/2017, 9:24 AM

## 2017-02-02 NOTE — ED Notes (Signed)
Headache since Saturday.  Says turned into migraine causing nausea since yesterday.  Says she has not had a migrain in at least 20 years.  Also pain in spine/neck and aching.  Says the office tested her for flu and it was negative.

## 2017-02-02 NOTE — ED Provider Notes (Signed)
[facility] Emergency Department Provider Note    First MD Initiated Contact with Patient 02/02/17 [phone]     (approximate)  I have reviewed the triage vital signs and the nursing notes.   HISTORY  Chief Complaint Fever; Headache; Abdominal Pain; Back Pain; and Neck Pain    HPI DEVANI ODONNEL is a 51 y.o. female who presents with chief complaint of severe headache starting yesterday associated with neck pain nausea vomiting abdominal pain. Did develop a fever yesterday. Came in today because the pain became acutely worse.Denies any dysuria.  Denies any chest pain or shortness of breath.  No previous back surgeries.   Past Medical History:  Diagnosis Date  . Anemia   . Arthritis   . Complication of anesthesia 12/13/2015   aspiration after breathing tube removed after several surgeries  . Depression   . Diabetes mellitus without complication ([place])   . GERD (gastroesophageal reflux disease)   . Hyperlipidemia   . Hypertension   . Infection 2012   right knee infection, required arthroscopy, long term antibiotic treatment, aspiration therapy   Family History  Problem Relation Age of Onset  . Diabetes Mother   . CAD Mother   . Diabetes Father   . Hyperlipidemia Father    Past Surgical History:  Procedure Laterality Date  . CHOLECYSTECTOMY    . KNEE ARTHROSCOPY Right 11/30/2015   Procedure: ARTHROSCOPY KNEE;  Surgeon: Hessie Knows, MD;  Location: [facility] ORS;  Service: Orthopedics;  Laterality: Right;  . KNEE ARTHROSCOPY Right 12/13/2015   Procedure: ARTHROSCOPY KNEE AND DEBRIDEMENT;  Surgeon: Hessie Knows, MD;  Location: [facility] ORS;  Service: Orthopedics;  Laterality: Right;  . MRSA abdominal wall    . Right knee arthroscopic    . TOTAL KNEE ARTHROPLASTY Right 08/08/2016   Procedure: TOTAL KNEE ARTHROPLASTY;  Surgeon: Hessie Knows, MD;  Location: [facility] ORS;  Service: Orthopedics;  Laterality: Right;  . TUBAL LIGATION  1997   Patient Active Problem  List   Diagnosis Date Noted  . GERD (gastroesophageal reflux disease) 12/18/2016  . History of hiatal hernia 12/18/2016  . Esophageal dysmotility 12/18/2016  . Anxiety disorder 10/31/2016  . Hyperlipidemia 10/31/2016  . Hyperlipidemia associated with type 2 diabetes mellitus ([place]) 10/31/2016  . S/P total knee arthroplasty, right 10/31/2016  . Primary osteoarthritis of right knee 08/08/2016  . Pyogenic arthritis ([place]) 12/13/2015  . History of septic arthritis 11/30/2015  . Acute pain of right knee 10/24/2015  . Hypertension 09/05/2015  . BPPV (benign paroxysmal positional vertigo) 09/05/2015  . Insomnia 09/05/2015  . Controlled type 2 diabetes mellitus with neuropathy ([place]) 09/05/2015  . Type 2 diabetes mellitus with diabetic polyneuropathy ([place]) 09/05/2015      Prior to Admission medications   Medication Sig Start Date End Date Taking? Authorizing Provider  aspirin EC 81 MG tablet Take 81 mg by mouth daily.    Yes [provider]  atorvastatin (LIPITOR) 10 MG tablet Take 1 tablet (10 mg total) by mouth daily. 10/31/16  Yes Karamalegos, Devonne Doughty, DO  FLUoxetine (PROZAC) 40 MG capsule Take 1 capsule (40 mg total) by mouth daily. 10/31/16  Yes Karamalegos, Devonne Doughty, DO  glimepiride (AMARYL) 4 MG tablet Take 1 tablet (4 mg total) by mouth 2 (two) times daily. 10/31/16  Yes Karamalegos, Devonne Doughty, DO  Insulin Pen Needle (RELION PEN NEEDLES) 31G X 6 MM MISC 1 each by Does not apply route daily. Use with Toujeo insulin as directed. 10/29/16  Yes Karamalegos, Devonne Doughty,  DO  lisinopril (PRINIVIL,ZESTRIL) 20 MG tablet Take 1 tablet (20 mg total) by mouth daily. 12/03/16  Yes Karamalegos, Netta Neat, DO  loratadine (CLARITIN) 10 MG tablet Take 1 tablet (10 mg total) by mouth daily. 12/15/16 12/15/17 Yes Jeanmarie Plant, MD  metFORMIN (GLUCOPHAGE) 1000 MG tablet Take 1 tablet (1,000 mg total) by mouth 2 (two) times daily with a meal. 10/31/16  Yes Karamalegos, Netta Neat, DO  metoprolol  tartrate (LOPRESSOR) 25 MG tablet Take 1 tablet (25 mg total) by mouth 2 (two) times daily. 10/31/16  Yes Karamalegos, Netta Neat, DO  omeprazole (PRILOSEC) 20 MG capsule Take 1 capsule (20 mg total) by mouth 2 (two) times daily before a meal. 12/18/16  Yes Karamalegos, Netta Neat, DO  acetaminophen (TYLENOL 8 HOUR ARTHRITIS PAIN) 650 MG CR tablet Take 650 mg by mouth every 8 (eight) hours as needed for pain.    [provider]  albuterol (PROVENTIL HFA;VENTOLIN HFA) 108 (90 Base) MCG/ACT inhaler Inhale 2 puffs into the lungs every 6 (six) hours as needed for wheezing or shortness of breath. 12/15/16   Jeanmarie Plant, MD  blood glucose meter kit and supplies KIT Dispense based on patient and insurance preference. Use up to four times daily as directed. (FOR ICD-9 250.00, 250.01). 09/05/15   Krebs, Amy Lauren, NP  glucose blood (ONE TOUCH ULTRA TEST) test strip CHECK BLOOD GLUCOSE UP TO FOUR TIMES DAILY AS DIRECTED 10/28/16   Althea Charon, Netta Neat, DO  Insulin Glargine (TOUJEO SOLOSTAR) 300 UNIT/ML SOPN Inject 40-60 Units into the skin 2 (two) times daily. 20 units in the morning and 40 units at bedtime 10/31/16   Karamalegos, Alexander J, DO  ONETOUCH DELICA LANCETS 33G MISC USE TO CHECK BLOOD GLUCOSE UP TO FOUR TIMES DAILY AS DIRECTED 12/18/15   Laroy Apple, Laurel Dimmer, NP    Allergies Patient has no known allergies.    Social History Social History  Substance Use Topics  . Smoking status: Never Smoker  . Smokeless tobacco: Never Used  . Alcohol use No     Comment: rare    Review of Systems Patient denies headaches, rhinorrhea, blurry vision, numbness, shortness of breath, chest pain, edema, cough, abdominal pain, nausea, vomiting, diarrhea, dysuria, fevers, rashes or hallucinations unless otherwise stated above in HPI. ____________________________________________   PHYSICAL EXAM:  VITAL SIGNS: Vitals:   02/02/17 0951  BP: (!) 158/91  Pulse: (!) 141  Resp: (!) 24  Temp: 100 F  (37.8 C)    Constitutional: Alert and oriented. Ill appearing Eyes: Conjunctivae are normal.  Head: Atraumatic. Nose: No congestion/rhinnorhea. Mouth/Throat: Mucous membranes are moist.   Neck: No stridor. + meningismus Cardiovascular: Normal rate, regular rhythm. Grossly normal heart sounds.  Good peripheral circulation. Respiratory: Normal respiratory effort.  No retractions. Lungs CTAB. Gastrointestinal: Soft and nontender. No distention. No abdominal bruits. No CVA tenderness. Musculoskeletal: No lower extremity tenderness nor edema.  No joint effusions. Neurologic:  CN- intact.  No facial droop, Normal FNF.  Normal heel to shin.  Sensation intact bilaterally. Normal speech and language. No gross focal neurologic deficits are appreciated. No gait instability. Skin:  Skin is warm, dry and intact.  Dermatomal rash to left thorax roughly in the t5 or t6 distribution consistent with shingles Psychiatric: Mood and affect are normal. Speech and behavior are normal.  ____________________________________________   LABS (all labs ordered are listed, but only abnormal results are displayed)  Results for orders placed or performed during the hospital encounter of 02/02/17 (from the past  24 hour(s))  Lactic acid, plasma     Status: None   Collection Time: 02/02/17 10:12 AM  Result Value Ref Range   Lactic Acid, Venous 0.9 0.5 - 1.9 mmol/L  Comprehensive metabolic panel     Status: Abnormal   Collection Time: 02/02/17 10:12 AM  Result Value Ref Range   Sodium 136 135 - 145 mmol/L   Potassium 4.6 3.5 - 5.1 mmol/L   Chloride 103 101 - 111 mmol/L   CO2 24 22 - 32 mmol/L   Glucose, Bld 222 (H) 65 - 99 mg/dL   BUN 16 6 - 20 mg/dL   Creatinine, Ser 0.92 0.44 - 1.00 mg/dL   Calcium 9.5 8.9 - 10.3 mg/dL   Total Protein 7.6 6.5 - 8.1 g/dL   Albumin 3.9 3.5 - 5.0 g/dL   AST 20 15 - 41 U/L   ALT 18 14 - 54 U/L   Alkaline Phosphatase 76 38 - 126 U/L   Total Bilirubin 0.6 0.3 - 1.2 mg/dL   GFR  calc non Af Amer >60 >60 mL/min   GFR calc Af Amer >60 >60 mL/min   Anion gap 9 5 - 15  Troponin I     Status: None   Collection Time: 02/02/17 10:12 AM  Result Value Ref Range   Troponin I <0.03 <0.03 ng/mL  CBC WITH DIFFERENTIAL     Status: Abnormal   Collection Time: 02/02/17 10:12 AM  Result Value Ref Range   WBC 6.8 3.6 - 11.0 K/uL   RBC 4.04 3.80 - 5.20 MIL/uL   Hemoglobin 10.2 (L) 12.0 - 16.0 g/dL   HCT 30.7 (L) 35.0 - 47.0 %   MCV 76.0 (L) 80.0 - 100.0 fL   MCH 25.3 (L) 26.0 - 34.0 pg   MCHC 33.3 32.0 - 36.0 g/dL   RDW 16.6 (H) 11.5 - 14.5 %   Platelets 369 150 - 440 K/uL   Neutrophils Relative % 79 %   Neutro Abs 5.4 1.4 - 6.5 K/uL   Lymphocytes Relative 14 %   Lymphs Abs 1.0 1.0 - 3.6 K/uL   Monocytes Relative 5 %   Monocytes Absolute 0.4 0.2 - 0.9 K/uL   Eosinophils Relative 1 %   Eosinophils Absolute 0.1 0 - 0.7 K/uL   Basophils Relative 1 %   Basophils Absolute 0.0 0 - 0.1 K/uL  Lipase, blood     Status: None   Collection Time: 02/02/17 10:12 AM  Result Value Ref Range   Lipase 20 11 - 51 U/L  Urinalysis, Complete w Microscopic     Status: Abnormal   Collection Time: 02/02/17 10:12 AM  Result Value Ref Range   Color, Urine YELLOW (A) YELLOW   APPearance HAZY (A) CLEAR   Specific Gravity, Urine 1.023 1.005 - 1.030   pH 5.0 5.0 - 8.0   Glucose, UA 50 (A) NEGATIVE mg/dL   Hgb urine dipstick NEGATIVE NEGATIVE   Bilirubin Urine NEGATIVE NEGATIVE   Ketones, ur 20 (A) NEGATIVE mg/dL   Protein, ur 30 (A) NEGATIVE mg/dL   Nitrite NEGATIVE NEGATIVE   Leukocytes, UA TRACE (A) NEGATIVE   RBC / HPF 0-5 0 - 5 RBC/hpf   WBC, UA 6-30 0 - 5 WBC/hpf   Bacteria, UA NONE SEEN NONE SEEN   Squamous Epithelial / LPF 6-30 (A) NONE SEEN  CSF cell count with differential     Status: Abnormal   Collection Time: 02/02/17 12:50 PM  Result Value Ref Range   Tube #  3    Color, CSF COLORLESS COLORLESS   Appearance, CSF CLEAR (A) CLEAR   RBC Count, CSF 0 0 - 3 /cu mm   WBC, CSF  264 (HH) 0 - 5 /cu mm   Segmented Neutrophils-CSF 8 %   Lymphs, CSF 87 %   Monocyte-Macrophage-Spinal Fluid 8 %   Eosinophils, CSF 1 %  Glucose, CSF     Status: Abnormal   Collection Time: 02/02/17 12:50 PM  Result Value Ref Range   Glucose, CSF 87 (H) 40 - 70 mg/dL  Protein, CSF     Status: Abnormal   Collection Time: 02/02/17 12:50 PM  Result Value Ref Range   Total  Protein, CSF 96 (H) 15 - 45 mg/dL  CSF culture with Stat gram stain     Status: None (Preliminary result)   Collection Time: 02/02/17 12:58 PM  Result Value Ref Range   Specimen Description CSF    Special Requests NONE    Gram Stain      WBC PRESENT,BOTH PMN AND MONONUCLEAR RBCS PRESENT NO ORGANISMS SEEN    Culture PENDING    Report Status PENDING    ____________________________________________  EKG My review and personal interpretation at Time: 9:46   Indication: tachycardia  Rate: 130  Rhythm: sinus tach Axis: normal Other: normal intervals, non specific t wave abn ____________________________________________  RADIOLOGY  I personally reviewed all radiographic images ordered to evaluate for the above acute complaints and reviewed radiology reports and findings.  These findings were personally discussed with the patient.  Please see medical record for radiology report.  ____________________________________________   PROCEDURES  Procedure(s) performed:  .Lumbar Puncture Date/Time: 02/02/2017 2:32 PM Performed by: Merlyn Lot Authorized by: Merlyn Lot   Consent:    Consent obtained:  Verbal   Consent given by:  Patient   Risks discussed:  Bleeding, infection, pain, repeat procedure, nerve damage and headache   Alternatives discussed:  No treatment and delayed treatment Pre-procedure details:    Procedure purpose:  Diagnostic Anesthesia (see MAR for exact dosages):    Anesthesia method:  None Procedure details:    Lumbar space:  L3-L4 interspace   Patient position:  Sitting   Needle  gauge:  22   Needle type:  Whitacre pencil tip   Needle length (in):  3.5   Number of attempts:  1   Fluid appearance:  Clear   Tubes of fluid:  4   Total volume (ml):  4 Post-procedure:    Puncture site:  Adhesive bandage applied   Patient tolerance of procedure:  Tolerated well, no immediate complications       Critical Care performed: yes CRITICAL CARE Performed by: Merlyn Lot   Total critical care time: 45 minutes  Critical care time was exclusive of separately billable procedures and treating other patients.  Critical care was necessary to treat or prevent imminent or life-threatening deterioration.  Critical care was time spent personally by me on the following activities: development of treatment plan with patient and/or surrogate as well as nursing, discussions with consultants, evaluation of patient's response to treatment, examination of patient, obtaining history from patient or surrogate, ordering and performing treatments and interventions, ordering and review of laboratory studies, ordering and review of radiographic studies, pulse oximetry and re-evaluation of patient's condition.  ____________________________________________   INITIAL IMPRESSION / ASSESSMENT AND PLAN / ED COURSE  Pertinent labs & imaging results that were available during my care of the patient were reviewed by me and considered in my medical  decision making (see chart for details).  DDX: meningitis, encephalitis, shingles, pna, uti  RHIANNA RAULERSON is a 51 y.o. who presents to the ED with fever or tachycardia and severe headache with meningismus.  Patient febrile and tachycardic. Ill appearing but no focal neurodeficits. Her abdominal exam is soft and benign. No evidence of pneumonia.  Lower puncture performed due to concern for acute meningitis. CSF study does show lymphocytic predominance but does have significant white blood cell count. In the setting of her shingles on the left side  of her chest is likely viral in nature but as she is septic upon arrival patient will need admission to hospital for IV fluids and continued IV antibiotic and antiviral therapy until cultures return. I spoke with Dr. Darvin Neighbours who kindly agrees to admit patient for further evaluation and management.  Have discussed with the patient and available family all diagnostics and treatments performed thus far and all questions were answered to the best of my ability. The patient demonstrates understanding and agreement with plan.       ____________________________________________   FINAL CLINICAL IMPRESSION(S) / ED DIAGNOSES  Final diagnoses:  Sepsis, due to unspecified organism ([place])  Meningitis      NEW MEDICATIONS STARTED DURING THIS VISIT:  New Prescriptions   No medications on file     Note:  This document was prepared using Dragon voice recognition software and may include unintentional dictation errors.    Merlyn Lot, MD 02/02/17 [phone]

## 2017-02-02 NOTE — ED Triage Notes (Signed)
Pt to ed with c/o headache that started on Saturday, then fever, abd pain, nausea, and back pain that started last night. Pt sent from MD office for eval.  ekg done at triage.

## 2017-02-02 NOTE — Progress Notes (Signed)
Pharmacy Antibiotic Note  Patricia [name] is a 51 y.o. female admitted on 02/02/2017 with Meningitis. Pharmacy has been consulted for Vancomycin, ceftriaxone, and Acyclovir dosing. Patient received Ceftriaxone 2gm x 1, Vancomycin 1500mg  IV x 1 , and Acyclovir 715mg  IV x 1 dose in ED. Patient will also receive Doxycycline for possible RMSF.  ID following.   Plan: DW: 73 kg    Ke: 0.071   T1/2: 9.76    Vd: 50.4  Will start vancomycin 1250mg  IV every 12 hours, with 8 hours stack dose. Calculated trough at Css is 19. Trough ordered prior to 4th dose.    Will start patient on Ceftriaxone 2gm IV every 12 hours.   Will start patient on Acyclovir  600mg  IV every 8 hours (10mg /kg IBW Q8H due to BMI>30).     Height: 5\' 6"  (167.6 cm) Weight: 199 lb (90.3 kg) IBW/kg (Calculated) : 59.3  Temp (24hrs), Avg:101 F (38.3 C), Min:100 F (37.8 C), Max:101.9 F (38.8 C)   Recent Labs Lab 02/02/17 1012  WBC 6.8  CREATININE 0.92  LATICACIDVEN 0.9    Estimated Creatinine Clearance: 80 mL/min (by C-G formula based on SCr of 0.92 mg/dL).    No Known Allergies  Antimicrobials this admission: 6/11 Ceftriaxone >>  6/11 vancomycin  >>  6/11 Acyclovir>> 6/11 doxycycline >>  Dose adjustments this admission:  Microbiology results: 6/11 CSF Cx: sent 6/11 BCx: pending 6/11 UCx: pending 6/11  MRSA PCR: sent  Thank you for allowing pharmacy to be a part of this patient's care.  Gardner CandleSheema M Antwain Caliendo, PharmD, BCPS Clinical Pharmacist 02/02/2017 2:58 PM

## 2017-02-03 LAB — CBC
HCT: 27.4 % — ABNORMAL LOW (ref 35.0–47.0)
HEMOGLOBIN: 9.1 g/dL — AB (ref 12.0–16.0)
MCH: 25.7 pg — AB (ref 26.0–34.0)
MCHC: 33.1 g/dL (ref 32.0–36.0)
MCV: 77.6 fL — ABNORMAL LOW (ref 80.0–100.0)
Platelets: 333 10*3/uL (ref 150–440)
RBC: 3.53 MIL/uL — AB (ref 3.80–5.20)
RDW: 16.2 % — ABNORMAL HIGH (ref 11.5–14.5)
WBC: 7.2 10*3/uL (ref 3.6–11.0)

## 2017-02-03 LAB — GLUCOSE, CAPILLARY
GLUCOSE-CAPILLARY: 152 mg/dL — AB (ref 65–99)
GLUCOSE-CAPILLARY: 69 mg/dL (ref 65–99)
Glucose-Capillary: 60 mg/dL — ABNORMAL LOW (ref 65–99)
Glucose-Capillary: 86 mg/dL (ref 65–99)
Glucose-Capillary: 58 mg/dL — ABNORMAL LOW (ref 65–99)
Glucose-Capillary: 135 mg/dL — ABNORMAL HIGH (ref 65–99)

## 2017-02-03 LAB — BASIC METABOLIC PANEL
Anion gap: 7 (ref 5–15)
BUN: 17 mg/dL (ref 6–20)
CHLORIDE: 107 mmol/L (ref 101–111)
CO2: 24 mmol/L (ref 22–32)
Calcium: 8.3 mg/dL — ABNORMAL LOW (ref 8.9–10.3)
Creatinine, Ser: 0.92 mg/dL (ref 0.44–1.00)
GFR calc Af Amer: >60 mL/min (ref >60)
GFR calc non Af Amer: >60 mL/min (ref >60)
Glucose, Bld: 211 mg/dL — ABNORMAL HIGH (ref 65–99)
POTASSIUM: 4 mmol/L (ref 3.5–5.1)
SODIUM: 138 mmol/L (ref 135–145)

## 2017-02-03 LAB — URINE CULTURE

## 2017-02-03 LAB — HERPES SIMPLEX VIRUS(HSV) DNA BY PCR
HSV 1 DNA: NEGATIVE
HSV 2 DNA: NEGATIVE

## 2017-02-03 LAB — HEMOGLOBIN A1C
Hgb A1c MFr Bld: 7.6 % — ABNORMAL HIGH (ref 4.8–5.6)
Mean Plasma Glucose: 171 mg/dL

## 2017-02-03 LAB — MRSA PCR SCREENING: MRSA by PCR: NEGATIVE

## 2017-02-03 LAB — HSV 2 ANTIBODY, IGG

## 2017-02-03 MED ORDER — SODIUM CHLORIDE 0.9 % IV SOLN: 1000.0000 mL | INTRAVENOUS | Status: DC

## 2017-02-03 NOTE — Progress Notes (Addendum)
Inpatient Diabetes Program Recommendations  AACE/ADA: New Consensus Statement on Inpatient Glycemic Control (2015)  Target Ranges:  Prepandial:   less than 140 mg/dL      Peak postprandial:   less than 180 mg/dL (1-2 hours)      Critically ill patients:  140 - 180 mg/dL   Lab Results  Component Value Date   GLUCAP 152 (H) 02/03/2017   HGBA1C 7.6 (H) 02/02/2017    Review of Glycemic Control Results for Patricia [name], Patricia [name] (MRN [id number]) as of 02/03/2017 09:47  Ref. Range 02/02/2017 17:05 02/02/2017 20:57 02/03/2017 07:43  Glucose-Capillary Latest Ref Range: 65 - 99 mg/dL 213147 (H) 086202 (H) 578152 (H)    Diabetes history: Type 2 Outpatient Diabetes medications: Amaryl 4mg  bid, Toujeo 20 units qam, 40 units , Metformin 1000mg  bid Current orders for Inpatient glycemic control:  Levemir 24 units bid, Amaryl 4mg  bid, Novolog 0-15 units tid, Novolog 0-5 units qhs   Inpatient Diabetes Program Recommendations: Consider d/c Amaryl while patient is inpatient.   Susette RacerJulie Alex Mcmanigal, RN, BA, MHA, CDE Diabetes Coordinator Inpatient Diabetes Program  [phone] (Team Pager) [phone] [name] Office) 02/03/2017 9:51 AM

## 2017-02-03 NOTE — Plan of Care (Signed)
Problem: Physical Regulation: Goal: Ability to maintain clinical measurements within normal limits will improve Outcome: Not Progressing Pt has had two episodes of hypoglycemia today. Dr. Is aware and meds adjusted.

## 2017-02-03 NOTE — Progress Notes (Signed)
[facility] Date of Admission:  02/02/2017     ID: Doyle Askewhyllis J Allard is a 51 y.o. female with aseptic meningitis and shingles Active Problems:   Meningitis  Subjective: Doing better  ROS  Eleven systems are reviewed and negative except per hpi  Medications:  Antibiotics Given (last 72 hours)    Date/Time Action Medication Dose Rate   02/02/17 1136 New Bag/Given   acyclovir (ZOVIRAX) 715 mg in dextrose 5 % 150 mL IVPB 715 mg 164.3 mL/hr   02/02/17 1433 New Bag/Given   cefTRIAXone (ROCEPHIN) 2 g in dextrose 5 % 50 mL IVPB 2 g 100 mL/hr   02/02/17 1518 New Bag/Given   vancomycin (VANCOCIN) 1,500 mg in sodium chloride 0.9 % 500 mL IVPB 1,500 mg 250 mL/hr   02/02/17 1734 Given   doxycycline (VIBRA-TABS) tablet 100 mg 100 mg    02/02/17 2135 Given   doxycycline (VIBRA-TABS) tablet 100 mg 100 mg    02/02/17 2135 New Bag/Given   acyclovir (ZOVIRAX) 600 mg in dextrose 5 % 100 mL IVPB 600 mg 112 mL/hr   02/03/17 0002 New Bag/Given   vancomycin (VANCOCIN) 1,250 mg in sodium chloride 0.9 % 250 mL IVPB 1,250 mg 166.7 mL/hr   02/03/17 0301 New Bag/Given   cefTRIAXone (ROCEPHIN) 2 g in dextrose 5 % 50 mL IVPB 2 g 100 mL/hr   02/03/17 [id number] New Bag/Given   acyclovir (ZOVIRAX) 600 mg in dextrose 5 % 100 mL IVPB 600 mg 112 mL/hr   02/03/17 0850 Given   doxycycline (VIBRA-TABS) tablet 100 mg 100 mg    02/03/17 1403 New Bag/Given   acyclovir (ZOVIRAX) 600 mg in dextrose 5 % 100 mL IVPB 600 mg 112 mL/hr     . aspirin EC  81 mg Oral Daily  . atorvastatin  10 mg Oral Daily  . doxycycline  100 mg Oral Q12H  . enoxaparin (LOVENOX) injection  40 mg Subcutaneous Q24H  . FLUoxetine  40 mg Oral Daily  . glimepiride  4 mg Oral BID  . insulin aspart  0-15 Units Subcutaneous TID WC  . insulin aspart  0-5 Units Subcutaneous QHS  . insulin detemir  24 Units Subcutaneous BID  . lisinopril  20 mg Oral Daily  . metFORMIN  1,000 mg Oral BID WC  . metoprolol tartrate   25 mg Oral BID  . pantoprazole  40 mg Oral BID AC    Objective: Vital signs in last 24 hours: Temp:  [98.5 F (36.9 C)-101.9 F (38.8 C)] 98.6 F (37 C) (06/12 0804) Pulse Rate:  [78-115] 89 (06/12 0804) Resp:  [16-18] 18 (06/12 0804) BP: (128-156)/(74-84) 152/74 (06/12 0804) SpO2:  [97 %-98 %] 97 % (06/12 0804) Weight:  [90.8 kg (200 lb 3.2 oz)] 90.8 kg (200 lb 3.2 oz) (06/12 0536) Constitutional:  More alert HENT: [place]/AT, PERRLA, no scleral icterus Mouth/Throat: Oropharynx is clear and moist. No oropharyngeal exudate.  Cardiovascular: tachy, regu Pulmonary/Chest: Effort normal and breath sounds normal. No respiratory distress.  has no wheezes.  Neck =mild pain with flexion but supple  Abdominal: Soft. Bowel sounds are normal.  exhibits no distension. There is no tenderness.  Lymphadenopathy: no cervical adenopathy. No axillary adenopathy Neurological: alert and oriented to person, place, and time.  Skin: L chest has discrete lesions with erythema and wrap aound back in dermatomal pattern Psychiatric: a normal mood and affect.  behavior is normal.   Lab Results  Recent Labs  02/02/17 1012 02/03/17 0348  WBC  6.8 7.2  HGB 10.2* 9.1*  HCT 30.7* 27.4*  NA 136 138  K 4.6 4.0  CL 103 107  CO2 24 24  BUN 16 17  CREATININE 0.92 0.92    Microbiology: Results for orders placed or performed during the hospital encounter of 02/02/17  Urine culture     Status: Abnormal   Collection Time: 02/02/17  9:57 AM  Result Value Ref Range Status   Specimen Description URINE, RANDOM  Final   Special Requests NONE  Final   Culture MULTIPLE SPECIES PRESENT, SUGGEST RECOLLECTION (A)  Final   Report Status 02/03/2017 FINAL  Final  Blood Culture (routine x 2)     Status: None (Preliminary result)   Collection Time: 02/02/17 10:12 AM  Result Value Ref Range Status   Specimen Description BLOOD L AC  Final   Special Requests   Final    BOTTLES DRAWN AEROBIC AND ANAEROBIC Blood Culture  results may not be optimal due to an excessive volume of blood received in culture bottles   Culture NO GROWTH < 24 HOURS  Final   Report Status PENDING  Incomplete  Blood Culture (routine x 2)     Status: None (Preliminary result)   Collection Time: 02/02/17 10:14 AM  Result Value Ref Range Status   Specimen Description BLOOD R HAND  Final   Special Requests   Final    BOTTLES DRAWN AEROBIC AND ANAEROBIC Blood Culture adequate volume   Culture NO GROWTH < 24 HOURS  Final   Report Status PENDING  Incomplete  CSF culture with Stat gram stain     Status: None (Preliminary result)   Collection Time: 02/02/17 12:58 PM  Result Value Ref Range Status   Specimen Description CSF  Final   Special Requests NONE  Final   Gram Stain   Final    WBC PRESENT,BOTH PMN AND MONONUCLEAR RBCS PRESENT NO ORGANISMS SEEN    Culture   Final    NO GROWTH < 24 HOURS Performed at [facility], [address], [place], [place] [address]    Report Status PENDING  Incomplete    Studies/Results: Dg Chest Port 1 View  Result Date: 02/02/2017 CLINICAL DATA:  Migraine for 2 days with fever, abdominal pain, nausea and back pain. EXAM: PORTABLE CHEST 1 VIEW COMPARISON:  CT chest and chest radiograph 12/15/2016. FINDINGS: Trachea is midline. Heart size stable. Lungs are low in volume but clear. No pleural fluid. IMPRESSION: Low lung volumes.  No acute findings. Electronically Signed   By: Leanna Battles M.D.   On: 02/02/2017 10:16    Assessment/Plan: LYNSEY ANGE is a 51 y.o. female with hx HTN DM admitted with neck pain fever photophobia and found to have shingles eruption as well as aseptic meningitis. LP with lymphocytic predominant wbc 264.  She works at an [employer] but no other sick contacts.  Findings consistent with aseptic meningitis. With differential being enteorvirus, VZV, HSV most likely.   Recommendations Await vzv and enteroviral PCR to CSF HSV and cx pending Contin acyclovir and  doxy.. If stable in AM can dc on doxy x 5 total days and valtreex x 10 days with fu with me next week  Thank you very much for the consult. Will follow with you.  Kanesha Cadle P   02/03/2017, 2:37 PM

## 2017-02-03 NOTE — Progress Notes (Addendum)
Pt Fs is 58 pre prandial. Pt given 15gm carbs and recheck is 69. Pt is not symptomatic, no shakiness, no diaphoresis, speech is clear and pt is mentating. Discussed diabetic meds with pt, who stated that her normal glucose lvels at home are in 120's. This Clinical research associatewriter asked if diet here is different than home, pt stated she is eating more here. Dr. Luberta MutterKonidena paged and responded. This Clinical research associatewriter related blood sugar info. This Clinical research associatewriter received verbal orders to d/c amaryl, hold metformin at 1700, and do fs q3h until glucose is >100.

## 2017-02-03 NOTE — Progress Notes (Signed)
[facility] [place] [facility]   PATIENT NAME: Patricia [name]    MR#:  [id number]  DATE OF BIRTH:  01/05/1966  SUBJECTIVE: Patient is seen at bedside. Admitted for meningitis. No further fever. Still has some headache but no neck stiffness. Patient has shingles on left side of the chest below the breast. There are dry erythematous but no active drainage.   CHIEF COMPLAINT:   Chief Complaint  Patient presents with  . Fever  . Headache  . Abdominal Pain  . Back Pain  . Neck Pain    REVIEW OF SYSTEMS:    Review of Systems  Constitutional: Negative for chills and fever.  HENT: Negative for hearing loss.   Eyes: Negative for blurred vision, double vision and photophobia.  Respiratory: Negative for cough, hemoptysis and shortness of breath.   Cardiovascular: Negative for palpitations, orthopnea and leg swelling.  Gastrointestinal: Negative for abdominal pain, diarrhea and vomiting.  Genitourinary: Negative for dysuria and urgency.  Musculoskeletal: Negative for myalgias and neck pain.  Skin: Positive for rash.  Neurological: Positive for headaches. Negative for dizziness, focal weakness, seizures and weakness.  Psychiatric/Behavioral: Negative for memory loss. The patient does not have insomnia.     Nutrition:  Tolerating Diet: Tolerating PT:      DRUG ALLERGIES:  No Known Allergies  VITALS:  Blood pressure (!) 152/74, pulse 89, temperature 98.6 F (37 C), temperature source Oral, resp. rate 18, height 5\' 6"  (1.676 m), weight 90.8 kg (200 lb 3.2 oz), last menstrual period 07/10/2015, SpO2 97 %.  PHYSICAL EXAMINATION:   Physical Exam  GENERAL:  51 y.o.-year-old patient lying in the bed with no acute distress.  EYES: Pupils equal, round, reactive to light  No scleral icterus. Extraocular muscles intact.  HEENT: Head atraumatic, normocephalic. Oropharynx and nasopharynx clear.  NECK:  Supple, no jugular venous distention. No thyroid  enlargement, no tenderness.  LUNGS: Normal breath sounds bilaterally, no wheezing, rales,rhonchi or crepitation. No use of accessory muscles of respiration.  CARDIOVASCULAR: S1, S2 normal. No murmurs, rubs, or gallops.  ABDOMEN: Soft, nontender, nondistended. Bowel sounds present. No organomegaly or mass.  EXTREMITIES: No pedal edema, cyanosis, or clubbing.  NEUROLOGIC: Cranial nerves II through XII are intact. Muscle strength 5/5 in all extremities. Sensation intact. Gait not checked.  PSYCHIATRIC: The patient is alert and oriented x 3.  SKIN: dry scabbed erythematous rash on left side below breast.  LABORATORY PANEL:   CBC  Recent Labs Lab 02/03/17 0348  WBC 7.2  HGB 9.1*  HCT 27.4*  PLT 333   ------------------------------------------------------------------------------------------------------------------  Chemistries   Recent Labs Lab 02/02/17 1012 02/03/17 0348  NA 136 138  K 4.6 4.0  CL 103 107  CO2 24 24  GLUCOSE 222* 211*  BUN 16 17  CREATININE 0.92 0.92  CALCIUM 9.5 8.3*  AST 20  --   ALT 18  --   ALKPHOS 76  --   BILITOT 0.6  --    ------------------------------------------------------------------------------------------------------------------  Cardiac Enzymes  Recent Labs Lab 02/02/17 1012  TROPONINI <0.03   ------------------------------------------------------------------------------------------------------------------  RADIOLOGY:  Dg Chest Port 1 View  Result Date: 02/02/2017 CLINICAL DATA:  Migraine for 2 days with fever, abdominal pain, nausea and back pain. EXAM: PORTABLE CHEST 1 VIEW COMPARISON:  CT chest and chest radiograph 12/15/2016. FINDINGS: Trachea is midline. Heart size stable. Lungs are low in volume but clear. No pleural fluid. IMPRESSION: Low lung volumes.  No acute findings. Electronically Signed   By: Juliette AlcideMelinda  Blietz M.D.   On: 02/02/2017 10:16     ASSESSMENT AND PLAN:   Active Problems:   Meningitis  1.acute  meningitis;likely due to shingles;continue acyclovir and also enteroviral on doxycycline.check hsv cultures.stop vanco.no further fever..Continue airborne isolation. 2.htn';' controlled; continue lisinopril, metoprolol. 3 diabetes mellitus type 2: Continue Amaryl 4 mg by mouth twice a day, SSI with coverage. 4 GI, DVT prophylaxis.  All the records are reviewed and case discussed with Care Management/Social Workerr. Management plans discussed with the patient, family and they are in agreement.  CODE STATUS: full  TOTAL TIME TAKING CARE OF THIS PATIENT: 35 minutes.   POSSIBLE D/C IN 1-2DAYS, DEPENDING ON CLINICAL CONDITION.   Katha Hamming M.D on 02/03/2017 at 9:40 AM  Between 7am to 6pm - Pager - [phone]  After 6pm go to [email or web address] - password [email or web address] [facility]  [place] [facility]  Office  [phone]  CC: Primary care physician; Smitty Cords, DO

## 2017-02-03 NOTE — Progress Notes (Signed)
Shift assessment completed. Pt is awake, alert and oriented, able to tolerate full light in the room and is talking on the phone. Pt stated she did have a headache. Pt is on room air, denied feeling sob. telebox in place. Abdomen is soft, bs heard. Pt is oob to bathroom prn to void, ppp, no edema noted. Pt has same darker red scattered areas under her L breast, now has a light pink extension of these in striae with small raised areas beginning to surface extending down to LUQ, and around to L flank. Pt stated some slight sensation to the area but overall not much pain or burning . Discussed with patient the difference between bacterial and viral meningitis, the need to continue airborne precautions for pt's dx of shingles, and what this means for possible visitors to pt. Also discussed plan of care. Dr. Amado CoeGouru has rounded on pt, ivf has been dc'd, and pt received motrin for headache initially, then has received tylenol and toradol iv for pain as well. Pt recent fs was 60, pt not symptomatic with this, was given orange juice and lunch arrived a few minutes later.

## 2017-02-04 LAB — GLUCOSE, CAPILLARY
GLUCOSE-CAPILLARY: 57 mg/dL — AB (ref 65–99)
GLUCOSE-CAPILLARY: 159 mg/dL — AB (ref 65–99)
Glucose-Capillary: 86 mg/dL (ref 65–99)
Glucose-Capillary: 129 mg/dL — ABNORMAL HIGH (ref 65–99)
Glucose-Capillary: 195 mg/dL — ABNORMAL HIGH (ref 65–99)
Glucose-Capillary: 203 mg/dL — ABNORMAL HIGH (ref 65–99)

## 2017-02-04 LAB — MISC LABCORP TEST (SEND OUT): Labcorp test code: 138313

## 2017-02-04 LAB — HIV ANTIBODY (ROUTINE TESTING W REFLEX): HIV Screen 4th Generation wRfx: NONREACTIVE

## 2017-02-04 LAB — ENTEROVIRUS PCR: Enterovirus PCR: NEGATIVE

## 2017-02-04 MED ORDER — ALUM & MAG HYDROXIDE-SIMETH 200-200-20 MG/5ML PO SUSP: 30.0000 mL | Freq: Four times a day (QID) | ORAL | Status: DC | PRN

## 2017-02-04 NOTE — Progress Notes (Signed)
[facility] [place] [facility]   PATIENT NAME: Patricia [name]    MR#:  [id number]  DATE OF BIRTH:  04/14/1966  SUBJECTIVE: Patient is seen at bedside. Admitted for meningitis. No further fever. Still has some headache but no neck stiffness. Low sugars since yesterday afternoon, as well as 58 yesterday evening and the latest sugar was 86. Patient has headache but no other complaints.   CHIEF COMPLAINT:   Chief Complaint  Patient presents with  . Fever  . Headache  . Abdominal Pain  . Back Pain  . Neck Pain    REVIEW OF SYSTEMS:    Review of Systems  Constitutional: Negative for chills and fever.  HENT: Negative for hearing loss.   Eyes: Negative for blurred vision, double vision and photophobia.  Respiratory: Negative for cough, hemoptysis and shortness of breath.   Cardiovascular: Negative for palpitations, orthopnea and leg swelling.  Gastrointestinal: Negative for abdominal pain, diarrhea and vomiting.  Genitourinary: Negative for dysuria and urgency.  Musculoskeletal: Negative for myalgias and neck pain.  Skin: Positive for rash.  Neurological: Positive for headaches. Negative for dizziness, focal weakness, seizures and weakness.  Psychiatric/Behavioral: Negative for memory loss. The patient does not have insomnia.     Nutrition:  Tolerating Diet: Tolerating PT:      DRUG ALLERGIES:  No Known Allergies  VITALS:  Blood pressure (!) 144/72, pulse 76, temperature 97.8 F (36.6 C), temperature source Oral, resp. rate 16, height 5\' 6"  (1.676 m), weight 93.1 kg (205 lb 3.2 oz), last menstrual period 07/10/2015, SpO2 96 %.  PHYSICAL EXAMINATION:   Physical Exam  GENERAL:  51 y.o.-year-old patient lying in the bed with no acute distress.  EYES: Pupils equal, round, reactive to light  No scleral icterus. Extraocular muscles intact.  HEENT: Head atraumatic, normocephalic. Oropharynx and nasopharynx clear.  NECK:  Supple, no jugular venous  distention. No thyroid enlargement, no tenderness.  LUNGS: Normal breath sounds bilaterally, no wheezing, rales,rhonchi or crepitation. No use of accessory muscles of respiration.  CARDIOVASCULAR: S1, S2 normal. No murmurs, rubs, or gallops.  ABDOMEN: Soft, nontender, nondistended. Bowel sounds present. No organomegaly or mass.  EXTREMITIES: No pedal edema, cyanosis, or clubbing.  NEUROLOGIC: Cranial nerves II through XII are intact. Muscle strength 5/5 in all extremities. Sensation intact. Gait not checked.  PSYCHIATRIC: The patient is alert and oriented x 3.  SKIN: dry scabbed erythematous rash on left side below breast.  LABORATORY PANEL:   CBC  Recent Labs Lab 02/03/17 0348  WBC 7.2  HGB 9.1*  HCT 27.4*  PLT 333   ------------------------------------------------------------------------------------------------------------------  Chemistries   Recent Labs Lab 02/02/17 1012 02/03/17 0348  NA 136 138  K 4.6 4.0  CL 103 107  CO2 24 24  GLUCOSE 222* 211*  BUN 16 17  CREATININE 0.92 0.92  CALCIUM 9.5 8.3*  AST 20  --   ALT 18  --   ALKPHOS 76  --   BILITOT 0.6  --    ------------------------------------------------------------------------------------------------------------------  Cardiac Enzymes  Recent Labs Lab 02/02/17 1012  TROPONINI <0.03   ------------------------------------------------------------------------------------------------------------------  RADIOLOGY:  Dg Chest Port 1 View  Result Date: 02/02/2017 CLINICAL DATA:  Migraine for 2 days with fever, abdominal pain, nausea and back pain. EXAM: PORTABLE CHEST 1 VIEW COMPARISON:  CT chest and chest radiograph 12/15/2016. FINDINGS: Trachea is midline. Heart size stable. Lungs are low in volume but clear. No pleural fluid. IMPRESSION: Low lung volumes.  No acute findings. Electronically Signed  By: Leanna Battles M.D.   On: 02/02/2017 10:16     ASSESSMENT AND PLAN:   Active Problems:    Meningitis  1.acute meningitis;likely due to shingles;continue acyclovir and also enteroviral on doxycycline.check hsv cultures.stop vanco.no further fever..Continue airborne isolationPossible discharge tomorrow on doxycycline for 4 days, Valtrex for 9 days. Unable to discharge today because of hypoglycemia and patient has symptoms like headache. While stop the Amaryl, Lantus, metformin and watch today for further development of hypoglycemia, if hypoglycemia resolves patient can be discharged home with low doses of Lantus, follow up with Dr. Sampson Goon in the office for meningitis.,   . 2.htn';' controlled; continue lisinopril, metoprolol. 3 .diabetes mellitus type 2: Hypoglycemia, hypoglycemia likely related to Amaryl. Stop the Amaryl, Lantus, watch blood sugars every 3 hours and if needed will start D5 or D10 in IV fluids.  4 GI, DVT prophylaxis.  All the records are reviewed and case discussed with Care Management/Social Workerr. Management plans discussed with the patient, family and they are in agreement.  CODE STATUS: full  TOTAL TIME TAKING CARE OF THIS PATIENT: 35 minutes.   POSSIBLE D/C IN 1-2DAYS, DEPENDING ON CLINICAL CONDITION.   Katha Hamming M.D on 02/04/2017 at 7:19 AM  Between 7am to 6pm - Pager - [phone]  After 6pm go to [email or web address] - password [email or web address] [facility]  [place] [facility]  Office  [phone]  CC: Primary care physician; Smitty Cords, DO

## 2017-02-04 NOTE — Progress Notes (Signed)
Inpatient Diabetes Program Recommendations  AACE/ADA: New Consensus Statement on Inpatient Glycemic Control (2015)  Target Ranges:  Prepandial:   less than 140 mg/dL      Peak postprandial:   less than 180 mg/dL (1-2 hours)      Critically ill patients:  140 - 180 mg/dL   Results for Patricia [name], Patricia [name] (MRN [id number]) as of 02/04/2017 10:50  Ref. Range 02/03/2017 07:43 02/03/2017 11:43 02/03/2017 12:41 02/03/2017 16:37 02/03/2017 17:01 02/03/2017 20:09 02/04/2017 04:43 02/04/2017 05:01 02/04/2017 07:41  Glucose-Capillary Latest Ref Range: 65 - 99 mg/dL 244152 (H) 60 (L) 86 58 (L) 69 135 (H) 57 (L) 86 159 (H)   Review of Glycemic Control  Diabetes history: DM2 Outpatient Diabetes medications: Amaryl 4 mg BID, Toujeo 20 units QAM, Toujeo 40 units QHS , Metformin 1000 mg BID Current orders for Inpatient glycemic control:  Novolog 0-15 units TID, Novolog 0-5 units QHS   Inpatient Diabetes Program Recommendations:  Insulin - Basal: Noted Levemir 24 units BID was discontinued. Patient received Levemir 24 units twice on 02/03/17 and glucose 57 mg/dl today at 0:104:43 am. Also noted Amaryl was discontinued on 02/03/17. Anticipate patient will need basal insulin at lower dose. Recommend ordering Levemir 20 units QHS starting today (approximately based on  93 kg x 0.2 units).  Thanks, Patricia PennerMarie Peaches Vanoverbeke, RN, MSN, CDE Diabetes Coordinator Inpatient Diabetes Program [phone] (Team Pager from 8am to 5pm)

## 2017-02-04 NOTE — Plan of Care (Signed)
Problem: Bowel/Gastric: Goal: Will not experience complications related to bowel motility Outcome: Progressing Pt is progressing toward goals. Pt is aware of plan of care and need to regular blood sugar before discharge.

## 2017-02-04 NOTE — Progress Notes (Signed)
Hypoglycemic Event  CBG: 57 at 0443  Treatment: OJ 120 ml, Pt also ate a pack of graham crackers  Symptoms: Pt states feeling bad  Follow-up CBG: Time: 0501 CBG Result:86  Possible Reasons for Event:Not sure, possibly related to illness.  Comments/MD notified: Will pass on in report to continue q3h CBG checks.    Waynard EdwardsEmily B Neri Samek

## 2017-02-04 NOTE — Progress Notes (Signed)
Shift assessment completed. Pt is alert and oriented, stated that her head still hurts and that the shingle area under her l breast is beginning to feel some burning. Pt also reported that her stomach felt some burning as well. Pt remains on room air, denied any other symptoms. Dr. Luberta MutterKonidena has rounded on patient. Discussed with pt that diabetic meds will be held until blood sugar is stabilized. Pt received zofran for her stomach and motrin for pain.

## 2017-02-04 NOTE — Progress Notes (Signed)
Pt received tylenol po at thsi time per her request for ongoing discomfort.

## 2017-02-05 LAB — GLUCOSE, CAPILLARY
GLUCOSE-CAPILLARY: 189 mg/dL — AB (ref 65–99)
Glucose-Capillary: 168 mg/dL — ABNORMAL HIGH (ref 65–99)
Glucose-Capillary: 198 mg/dL — ABNORMAL HIGH (ref 65–99)
Glucose-Capillary: 206 mg/dL — ABNORMAL HIGH (ref 65–99)
Glucose-Capillary: 138 mg/dL — ABNORMAL HIGH (ref 65–99)
Glucose-Capillary: 226 mg/dL — ABNORMAL HIGH (ref 65–99)

## 2017-02-05 LAB — CSF CULTURE W GRAM STAIN: Culture: NO GROWTH

## 2017-02-05 MED ORDER — VALACYCLOVIR HCL 500 MG PO TABS
1000.0000 mg | ORAL_TABLET | Freq: Three times a day (TID) | ORAL | Status: DC
  Administered 2017-02-05 – 2017-02-06 (×2): 1000 mg via ORAL
  Filled 2017-02-05 (×4): qty 2

## 2017-02-05 MED ORDER — INSULIN DETEMIR 100 UNIT/ML ~~LOC~~ SOLN
10.0000 [IU] | Freq: Every day | SUBCUTANEOUS | Status: DC
  Administered 2017-02-05: 10 [IU] via SUBCUTANEOUS
  Filled 2017-02-05 (×2): qty 0.1

## 2017-02-05 NOTE — Progress Notes (Signed)
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

## 2017-02-05 NOTE — Progress Notes (Signed)
[facility] [place] [facility]   PATIENT NAME: Patricia [name]    MR#:  [id number]  DATE OF BIRTH:  [birth year]  SUBJECTIVE: Patient is seen at bedside. Admitted for meningitis. No further fever. Still has some headache but no neck stiffness. Uncomfortable to go home today from headache  denies any other complaints. Blood sugars are better today around 160-200   CHIEF COMPLAINT:   Chief Complaint  Patient presents with  . Fever  . Headache  . Abdominal Pain  . Back Pain  . Neck Pain    REVIEW OF SYSTEMS:    Review of Systems  Constitutional: Negative for chills and fever.  HENT: Negative for hearing loss.   Eyes: Negative for blurred vision, double vision and photophobia.  Respiratory: Negative for cough, hemoptysis and shortness of breath.   Cardiovascular: Negative for palpitations, orthopnea and leg swelling.  Gastrointestinal: Negative for abdominal pain, diarrhea and vomiting.  Genitourinary: Negative for dysuria and urgency.  Musculoskeletal: Negative for myalgias and neck pain.  Skin: Positive for rash.  Neurological: Positive for headaches. Negative for dizziness, focal weakness, seizures and weakness.  Psychiatric/Behavioral: Negative for memory loss. The patient does not have insomnia.     Nutrition:  Tolerating Diet: Tolerating PT:      DRUG ALLERGIES:  No Known Allergies  VITALS:  Blood pressure (!) 150/74, pulse 80, temperature 98.6 F (37 C), temperature source Oral, resp. rate 18, height 5\' 6"  (1.676 m), weight 92.9 kg (204 lb 12.8 oz), last menstrual period 07/10/2015, SpO2 98 %.  PHYSICAL EXAMINATION:   Physical Exam  GENERAL:  51 y.o.-year-old patient lying in the bed with no acute distress.  EYES: Pupils equal, round, reactive to light  No scleral icterus. Extraocular muscles intact.  HEENT: Head atraumatic, normocephalic. Oropharynx and nasopharynx clear.  NECK:  Supple, no jugular venous distention. No thyroid  enlargement, no tenderness.  LUNGS: Normal breath sounds bilaterally, no wheezing, rales,rhonchi or crepitation. No use of accessory muscles of respiration.  CARDIOVASCULAR: S1, S2 normal. No murmurs, rubs, or gallops.  ABDOMEN: Soft, nontender, nondistended. Bowel sounds present. No organomegaly or mass.  EXTREMITIES: No pedal edema, cyanosis, or clubbing.  NEUROLOGIC: Cranial nerves II through XII are intact. Muscle strength 5/5 in all extremities. Sensation intact. Gait not checked.  PSYCHIATRIC: The patient is alert and oriented x 3.  SKIN: dry scabbed erythematous rash on left side below breast.  LABORATORY PANEL:   CBC  Recent Labs Lab 02/03/17 0348  WBC 7.2  HGB 9.1*  HCT 27.4*  PLT 333   ------------------------------------------------------------------------------------------------------------------  Chemistries   Recent Labs Lab 02/02/17 1012 02/03/17 0348  NA 136 138  K 4.6 4.0  CL 103 107  CO2 24 24  GLUCOSE 222* 211*  BUN 16 17  CREATININE 0.92 0.92  CALCIUM 9.5 8.3*  AST 20  --   ALT 18  --   ALKPHOS 76  --   BILITOT 0.6  --    ------------------------------------------------------------------------------------------------------------------  Cardiac Enzymes  Recent Labs Lab 02/02/17 1012  TROPONINI <0.03   ------------------------------------------------------------------------------------------------------------------  RADIOLOGY:  No results found.   ASSESSMENT AND PLAN:   Active Problems:   Meningitis  1.acute meningitis; due to Varicella-zoster virus  Patient is treated with the IV is acyclovir. Dr. Sampson Goon has recommended Valtrex thousand milligrams 3 times a day for total 14 day course. Patient still needs 10 day of Valtrex  .Continue airborne isolation Possible discharge tomorrow on doxycycline for 1 more days, Valtrex for 9  days. Unable to discharge today because of headache. Pain medication as needed for headache Urine  cultures with the multiple species contaminated   # Hypoglycemia improved  Patient's Amaryl, Lantus and metformin were discontinued   patient can be discharged home with low doses of Lantus, follow up with Dr. Sampson GoonFitzgerald in the office for meningitis.,   . 2.htn';' controlled; continue lisinopril, metoprolol.  3 .diabetes mellitus type 2: Hypoglycemia, hypoglycemia likely related to Amaryl. Stop the Amaryl, Lantus,   4 GI, DVT prophylaxis.  All the records are reviewed and case discussed with Care Management/Social Workerr. Management plans discussed with the patient, family and they are in agreement.  CODE STATUS: full  TOTAL TIME TAKING CARE OF THIS PATIENT: 35 minutes.   POSSIBLE D/C IN 1DAYS, DEPENDING ON CLINICAL CONDITION.   Ramonita LabGouru, Henery Betzold M.D on 02/05/2017 at 3:00 PM  Between 7am to 6pm - Pager - [phone]  After 6pm go to [email or web address] - password [email or web address]  [facility]  Office  [phone]  CC: Primary care physician; Smitty CordsKaramalegos, Alexander J, DO

## 2017-02-05 NOTE — Progress Notes (Signed)
Inpatient Diabetes Program Recommendations  AACE/ADA: New Consensus Statement on Inpatient Glycemic Control (2015)  Target Ranges:  Prepandial:   less than 140 mg/dL      Peak postprandial:   less than 180 mg/dL (1-2 hours)      Critically ill patients:  140 - 180 mg/dL  Results for Patricia [name], Patricia [name] (MRN [id number]) as of 02/05/2017 09:41  Ref. Range 02/04/2017 04:43 02/04/2017 05:01 02/04/2017 07:41 02/04/2017 12:04 02/04/2017 16:06 02/04/2017 21:01 02/05/2017 00:07 02/05/2017 04:09 02/05/2017 07:57  Glucose-Capillary Latest Ref Range: 65 - 99 mg/dL 57 (L) 86 782159 (H) 956129 (H) 195 (H) 203 (H) 189 (H) 168 (H) 198 (H)    Review of Glycemic Control  Diabetes history: DM2 Outpatient Diabetes medications: Amaryl 4 mg BID, Toujeo 20 units QAM, Toujeo 40 units QHS , Metformin 1000 mg BID Current orders for Inpatient glycemic control: Novolog 0-15 units TID, Novolog 0-5 units QHS   Inpatient Diabetes Program Recommendations:  Insulin - Basal: Patient was ordered Levemir 24 units BID which has been discontinued. Patient received Levemir 24 units twice on 02/03/17 and fasting glucose 57 mg/dl 2/13/086/13/18. Also noted Amaryl was discontinued. If CBGs are consistently greater than 180 mg/dl, recommend reordering Levemir at low dose (Levemir 10 units Q24H).  Thanks, Orlando PennerMarie Faryn Sieg, RN, MSN, CDE Diabetes Coordinator Inpatient Diabetes Program [phone] (Team Pager from 8am to 5pm)

## 2017-02-06 LAB — GLUCOSE, CAPILLARY
GLUCOSE-CAPILLARY: 249 mg/dL — AB (ref 65–99)
Glucose-Capillary: 191 mg/dL — ABNORMAL HIGH (ref 65–99)

## 2017-02-06 MED ORDER — INSULIN GLARGINE 300 UNIT/ML ~~LOC~~ SOPN: 15.0000 [IU] | PEN_INJECTOR | Freq: Every day | SUBCUTANEOUS | 1 refills | Status: DC

## 2017-02-06 MED ORDER — IBUPROFEN 400 MG PO TABS: 400.0000 mg | ORAL_TABLET | Freq: Four times a day (QID) | ORAL | 0 refills | Status: DC | PRN

## 2017-02-06 MED ORDER — INSULIN DETEMIR 100 UNIT/ML ~~LOC~~ SOLN
14.0000 [IU] | Freq: Every day | SUBCUTANEOUS | Status: DC
  Administered 2017-02-06: 14 [IU] via SUBCUTANEOUS
  Filled 2017-02-06 (×2): qty 0.14

## 2017-02-06 MED ORDER — VALACYCLOVIR HCL 1 G PO TABS: 1000.0000 mg | ORAL_TABLET | Freq: Three times a day (TID) | ORAL | 0 refills | Status: DC

## 2017-02-06 NOTE — Discharge Summary (Signed)
[place] [facility] [place] NAME: Patricia [name]    MR#:  [id number]  DATE OF BIRTH:  [birth year]  DATE OF ADMISSION:  02/02/2017 ADMITTING PHYSICIAN: Hillary Bow, MD  DATE OF DISCHARGE: No discharge date for patient encounter.  PRIMARY CARE PHYSICIAN: Olin Hauser, DO    ADMISSION DIAGNOSIS:  Meningitis [G03.9] Sepsis, due to unspecified organism ([employer]) [A41.9]  DISCHARGE DIAGNOSIS:  Active Problems:   Meningitis   SECONDARY DIAGNOSIS:   Past Medical History:  Diagnosis Date  . Anemia   . Arthritis   . Complication of anesthesia 12/13/2015   aspiration after breathing tube removed after several surgeries  . Depression   . Diabetes mellitus without complication ([place])   . GERD (gastroesophageal reflux disease)   . Hyperlipidemia   . Hypertension   . Infection 2012   right knee infection, required arthroscopy, long term antibiotic treatment, aspiration therapy    HOSPITAL COURSE:  hpi  Patricia [name]  is a 51 y.o. female with a known history of Hypertension, diabetes, MRSA infection in the past presents to the emergency room complaining of feeling weak, fatigued and headaches since Friday. Patient started having headache, photophobia and neck pain. Later starting a myalgias and diffuse pain in the back all over. Today she went to urgent care was found to have fever 101.9, tachycardia and sepsis and sent to the emergency room. Here her CSF shows elevated WBC of 264 with no RBCs. Protein elevated at 95.  Patient is being admitted for meningitis. She did notice a rash 2 days back on left chest with no pain or itching.  1.acute meningitis; due to Varicella-zoster virus  LP with lymphocytic predominant wbc 264.  Patient is treated with the IV is acyclovir. Dr. Ola Spurr has recommended Valtrex thousand milligrams 3 times a day for total 14 day course as Confirmed VZV meningitis with zoster   . Patient still needs 9 more  day of Valtrex Patient completed a course of doxycycline during the hospital course   airborne isolation Pain medication as needed for headache Urine cultures with the multiple species contaminated   # Hypoglycemia improved  Patient's Amaryl,discontinued   patient can be discharged home with low doses of Lantus, follow up with Dr. Ola Spurr in the office for meningitis.,   . 2.htn';' controlled; continue lisinopril, metoprolol.  3 .diabetes mellitus type 2: Hypoglycemia, hypoglycemia likely related to Amaryl. Stop the Amaryl, patient will be discharged home with the low-dose glargine- 15 units  4 GI, DVT prophylaxis.   DISCHARGE CONDITIONS:   fair  CONSULTS OBTAINED:  Treatment Team:  Leonel Ramsay, MD   PROCEDURES   DRUG ALLERGIES:  No Known Allergies  DISCHARGE MEDICATIONS:   Current Discharge Medication List    START taking these medications   Details  ibuprofen (ADVIL,MOTRIN) 400 MG tablet Take 1 tablet (400 mg total) by mouth every 6 (six) hours as needed for moderate pain. Qty: 30 tablet, Refills: 0    valACYclovir (VALTREX) 1000 MG tablet Take 1 tablet (1,000 mg total) by mouth 3 (three) times daily. Qty: 28 tablet, Refills: 0      CONTINUE these medications which have CHANGED   Details  Insulin Glargine (TOUJEO SOLOSTAR) 300 UNIT/ML SOPN Inject 15 Units into the skin daily. 20 units in the morning and 40 units at bedtime Qty: 9 pen, Refills: 1   Associated Diagnoses: Controlled type 2 diabetes mellitus with diabetic polyneuropathy, with long-term current use of insulin ([place])  CONTINUE these medications which have NOT CHANGED   Details  aspirin EC 81 MG tablet Take 81 mg by mouth daily.     atorvastatin (LIPITOR) 10 MG tablet Take 1 tablet (10 mg total) by mouth daily. Qty: 90 tablet, Refills: 3   Associated Diagnoses: Hyperlipidemia associated with type 2 diabetes mellitus (HCC)    FLUoxetine (PROZAC) 40 MG capsule Take 1 capsule  (40 mg total) by mouth daily. Qty: 90 capsule, Refills: 3   Associated Diagnoses: Anxiety disorder, unspecified type    Insulin Pen Needle (RELION PEN NEEDLES) 31G X 6 MM MISC 1 each by Does not apply route daily. Use with Toujeo insulin as directed. Qty: 100 each, Refills: 11    lisinopril (PRINIVIL,ZESTRIL) 20 MG tablet Take 1 tablet (20 mg total) by mouth daily. Qty: 90 tablet, Refills: 3   Associated Diagnoses: Essential hypertension    loratadine (CLARITIN) 10 MG tablet Take 1 tablet (10 mg total) by mouth daily. Qty: 30 tablet, Refills: 2    metFORMIN (GLUCOPHAGE) 1000 MG tablet Take 1 tablet (1,000 mg total) by mouth 2 (two) times daily with a meal. Qty: 180 tablet, Refills: 3   Associated Diagnoses: Controlled type 2 diabetes mellitus with diabetic polyneuropathy, with long-term current use of insulin (HCC)    metoprolol tartrate (LOPRESSOR) 25 MG tablet Take 1 tablet (25 mg total) by mouth 2 (two) times daily. Qty: 180 tablet, Refills: 3   Associated Diagnoses: Essential hypertension    omeprazole (PRILOSEC) 20 MG capsule Take 1 capsule (20 mg total) by mouth 2 (two) times daily before a meal. Qty: 60 capsule, Refills: 5   Associated Diagnoses: Gastroesophageal reflux disease, esophagitis presence not specified    acetaminophen (TYLENOL 8 HOUR ARTHRITIS PAIN) 650 MG CR tablet Take 650 mg by mouth every 8 (eight) hours as needed for pain.    albuterol (PROVENTIL HFA;VENTOLIN HFA) 108 (90 Base) MCG/ACT inhaler Inhale 2 puffs into the lungs every 6 (six) hours as needed for wheezing or shortness of breath. Qty: 1 Inhaler, Refills: 2    blood glucose meter kit and supplies KIT Dispense based on patient and insurance preference. Use up to four times daily as directed. (FOR ICD-9 250.00, 250.01). Qty: 1 each, Refills: 0    glucose blood (ONE TOUCH ULTRA TEST) test strip CHECK BLOOD GLUCOSE UP TO FOUR TIMES DAILY AS DIRECTED Qty: 100 each, Refills: 11    ONETOUCH DELICA LANCETS  62Z MISC USE TO CHECK BLOOD GLUCOSE UP TO FOUR TIMES DAILY AS DIRECTED Qty: 100 each, Refills: 0      STOP taking these medications     glimepiride (AMARYL) 4 MG tablet          DISCHARGE INSTRUCTIONS:    Follow-up with primary care physician in a week and Dr. Ola Spurr infectious disease in 5-7 days  DIET:  Diabetic diet  DISCHARGE CONDITION:  Fair  ACTIVITY:  Activity as tolerated  OXYGEN:  Home Oxygen: No.   Oxygen Delivery: room air  DISCHARGE LOCATION:  home   If you experience worsening of your admission symptoms, develop shortness of breath, life threatening emergency, suicidal or homicidal thoughts you must seek medical attention immediately by calling 911 or calling your MD immediately  if symptoms less severe.  You Must read complete instructions/literature along with all the possible adverse reactions/side effects for all the Medicines you take and that have been prescribed to you. Take any new Medicines after you have completely understood and accpet all the possible adverse reactions/side  effects.   Please note  You were cared for by a hospitalist during your hospital stay. If you have any questions about your discharge medications or the care you received while you were in the hospital after you are discharged, you can call the unit and asked to speak with the hospitalist on call if the hospitalist that took care of you is not available. Once you are discharged, your primary care physician will handle any further medical issues. Please note that NO REFILLS for any discharge medications will be authorized once you are discharged, as it is imperative that you return to your primary care physician (or establish a relationship with a primary care physician if you do not have one) for your aftercare needs so that they can reassess your need for medications and monitor your lab values.     Today  Chief Complaint  Patient presents with  . Fever  . Headache  .  Abdominal Pain  . Back Pain  . Neck Pain   Patient is feeling much better headache significantly improved and wants to go home  ROS:  CONSTITUTIONAL: Denies fevers, chills. Denies any fatigue, weakness.  EYES: Denies blurry vision, double vision, eye pain. EARS, NOSE, THROAT: Denies tinnitus, ear pain, hearing loss. RESPIRATORY: Denies cough, wheeze, shortness of breath.  CARDIOVASCULAR: Denies chest pain, palpitations, edema.  GASTROINTESTINAL: Denies nausea, vomiting, diarrhea, abdominal pain. Denies bright red blood per rectum. GENITOURINARY: Denies dysuria, hematuria. ENDOCRINE: Denies nocturia or thyroid problems. HEMATOLOGIC AND LYMPHATIC: Denies easy bruising or bleeding. SKIN: Denies rash or lesion. MUSCULOSKELETAL: Denies pain in neck, back, shoulder, knees, hips or arthritic symptoms.  NEUROLOGIC: Denies paralysis, paresthesias.  PSYCHIATRIC: Denies anxiety or depressive symptoms.   VITAL SIGNS:  Blood pressure 134/76, pulse 81, temperature 99.3 F (37.4 C), temperature source Oral, resp. rate 16, height '5\' 6"'$  (1.676 m), weight 91.9 kg (202 lb 11.2 oz), last menstrual period 07/10/2015, SpO2 95 %.  I/O:  No intake or output data in the 24 hours ending 02/06/17 1325  PHYSICAL EXAMINATION:  GENERAL:  51 y.o.-year-old patient lying in the bed with no acute distress.  EYES: Pupils equal, round, reactive to light and accommodation. No scleral icterus. Extraocular muscles intact.  HEENT: Head atraumatic, normocephalic. Oropharynx and nasopharynx clear.  NECK:  Supple, no jugular venous distention. No thyroid enlargement, no tenderness.  LUNGS: Normal breath sounds bilaterally, no wheezing, rales,rhonchi or crepitation. No use of accessory muscles of respiration.  CARDIOVASCULAR: S1, S2 normal. No murmurs, rubs, or gallops.  ABDOMEN: Soft, non-tender, non-distended. Bowel sounds present. No organomegaly or mass.  EXTREMITIES: No pedal edema, cyanosis, or clubbing.   NEUROLOGIC: Cranial nerves II through XII are intact. Muscle strength 5/5 in all extremities. Sensation intact. Gait not checked.  PSYCHIATRIC: The patient is alert and oriented x 3.  SKIN: No obvious rash, lesion, or ulcer.   DATA REVIEW:   CBC  Recent Labs Lab 02/03/17 0348  WBC 7.2  HGB 9.1*  HCT 27.4*  PLT 333    Chemistries   Recent Labs Lab 02/02/17 1012 02/03/17 0348  NA 136 138  K 4.6 4.0  CL 103 107  CO2 24 24  GLUCOSE 222* 211*  BUN 16 17  CREATININE 0.92 0.92  CALCIUM 9.5 8.3*  AST 20  --   ALT 18  --   ALKPHOS 76  --   BILITOT 0.6  --     Cardiac Enzymes  Recent Labs Lab 02/02/17 1012  TROPONINI <0.03    Microbiology  Results  Results for orders placed or performed during the hospital encounter of 02/02/17  Urine culture     Status: Abnormal   Collection Time: 02/02/17  9:57 AM  Result Value Ref Range Status   Specimen Description URINE, RANDOM  Final   Special Requests NONE  Final   Culture MULTIPLE SPECIES PRESENT, SUGGEST RECOLLECTION (A)  Final   Report Status 02/03/2017 FINAL  Final  Blood Culture (routine x 2)     Status: None (Preliminary result)   Collection Time: 02/02/17 10:12 AM  Result Value Ref Range Status   Specimen Description BLOOD L AC  Final   Special Requests   Final    BOTTLES DRAWN AEROBIC AND ANAEROBIC Blood Culture results may not be optimal due to an excessive volume of blood received in culture bottles   Culture NO GROWTH 4 DAYS  Final   Report Status PENDING  Incomplete  Blood Culture (routine x 2)     Status: None (Preliminary result)   Collection Time: 02/02/17 10:14 AM  Result Value Ref Range Status   Specimen Description BLOOD R HAND  Final   Special Requests   Final    BOTTLES DRAWN AEROBIC AND ANAEROBIC Blood Culture adequate volume   Culture NO GROWTH 4 DAYS  Final   Report Status PENDING  Incomplete  CSF culture with Stat gram stain     Status: None   Collection Time: 02/02/17 12:58 PM  Result  Value Ref Range Status   Specimen Description CSF  Final   Special Requests NONE  Final   Gram Stain   Final    MANY WBC PRESENT,BOTH PMN AND MONONUCLEAR MANY RED BLOOD CELLS PRESENT NO ORGANISMS SEEN    Culture   Final    NO GROWTH 3 DAYS Performed at [facility], [address], [place], [place] [address]    Report Status 02/05/2017 FINAL  Final  MRSA PCR Screening     Status: None   Collection Time: 02/02/17 10:17 PM  Result Value Ref Range Status   MRSA by PCR NEGATIVE NEGATIVE Final    Comment:        The GeneXpert MRSA Assay (FDA approved for NASAL specimens only), is one component of a comprehensive MRSA colonization surveillance program. It is not intended to diagnose MRSA infection nor to guide or monitor treatment for MRSA infections.     RADIOLOGY:  No results found.  EKG:   Orders placed or performed during the hospital encounter of 02/02/17  . EKG 12-Lead  . EKG 12-Lead      Management plans discussed with the patient, family and they are in agreement.  CODE STATUS:     Code Status Orders        Start     Ordered   02/02/17 1443  Full code  Continuous     02/02/17 1443    Code Status History    Date Active Date Inactive Code Status Order ID Comments User Context   08/08/2016  2:47 PM 08/11/2016  9:54 PM Full Code [id number]  Hessie Knows, MD Inpatient      TOTAL TIME TAKING CARE OF THIS PATIENT: 45  minutes.   Note: This dictation was prepared with Dragon dictation along with smaller phrase technology. Any transcriptional errors that result from this process are unintentional.   '@M[facility]'$ @  on 02/06/2017 at 1:25 PM  Between 7am to 6pm - Pager - [phone]  After 6pm go to [email or web address] - [place]  [facility]  Office  [phone]  CC: Primary care physician; Olin Hauser, DO

## 2017-02-06 NOTE — Discharge Instructions (Signed)
Follow-up with primary care physician in a week and Dr. Sampson GoonFitzgerald infectious disease in 5-7 days

## 2017-02-06 NOTE — Progress Notes (Signed)
Patient is alert and oriented and able to verbalize needs. No complaints of pain at this time. VS stable. PIV removed. Discharge instructions gone over with patient at this time. Printed AVS and hard scripts for Valtrex, Toujeo and Motrin given to patient. No concerns voiced at this time. Patient will be discharged to home.    Suzan SlickAlison L Geron Mulford

## 2017-02-11 LAB — CULTURE, BLOOD (ROUTINE X 2)
CULTURE: NO GROWTH
Culture: NO GROWTH
SPECIAL REQUESTS: ADEQUATE

## 2017-02-16 ENCOUNTER — Inpatient Hospital Stay: Payer: BC Managed Care – PPO | Admitting: Family Medicine

## 2017-03-02 DIAGNOSIS — R898 Other abnormal findings in specimens from other organs, systems and tissues: Secondary | ICD-10-CM | POA: Insufficient documentation

## 2017-03-02 DIAGNOSIS — R768 Other specified abnormal immunological findings in serum: Secondary | ICD-10-CM | POA: Insufficient documentation

## 2017-03-02 DIAGNOSIS — D721 Eosinophilia: Secondary | ICD-10-CM

## 2017-04-08 ENCOUNTER — Encounter: Payer: Self-pay | Admitting: Family Medicine

## 2017-04-08 ENCOUNTER — Other Ambulatory Visit: Payer: Self-pay | Admitting: Family Medicine

## 2017-04-08 ENCOUNTER — Ambulatory Visit (INDEPENDENT_AMBULATORY_CARE_PROVIDER_SITE_OTHER): Payer: BC Managed Care – PPO | Admitting: Family Medicine

## 2017-04-08 VITALS — BP 119/76 | HR 102 | Temp 98.9°F | Resp 16 | Ht 66.0 in | Wt 197.0 lb

## 2017-04-08 DIAGNOSIS — D500 Iron deficiency anemia secondary to blood loss (chronic): Secondary | ICD-10-CM | POA: Diagnosis not present

## 2017-04-08 DIAGNOSIS — B021 Zoster meningitis: Secondary | ICD-10-CM | POA: Diagnosis not present

## 2017-04-08 DIAGNOSIS — E1140 Type 2 diabetes mellitus with diabetic neuropathy, unspecified: Secondary | ICD-10-CM

## 2017-04-08 DIAGNOSIS — I10 Essential (primary) hypertension: Secondary | ICD-10-CM

## 2017-04-08 DIAGNOSIS — Z0000 Encounter for general adult medical examination without abnormal findings: Secondary | ICD-10-CM

## 2017-04-08 DIAGNOSIS — E1169 Type 2 diabetes mellitus with other specified complication: Secondary | ICD-10-CM

## 2017-04-08 DIAGNOSIS — D638 Anemia in other chronic diseases classified elsewhere: Secondary | ICD-10-CM | POA: Insufficient documentation

## 2017-04-08 DIAGNOSIS — E785 Hyperlipidemia, unspecified: Secondary | ICD-10-CM

## 2017-04-08 NOTE — Assessment & Plan Note (Addendum)
Remains mostly controlled, A1c slight inc 7.3 to 7.6 today - Complication with mild neuropathy  Plan: 1. Continue Toujeo split dose AM/PM, Metformin 1000mg  BID - Will confirm with her Glimepiride instructions as was DC'd in hospital on discharge, but she is taking it still - Suspect would do well on GLP1 but never heard back if would like to proceed cost/coverage 2. Encouraged work on improving DM diet, eventually improve exercise pending knee recovery post-op 3. Continue ASA, statin, ACEi - due for DM eye exam, handout given 4. Follow-up 3 months for annual phys and fasting labs

## 2017-04-08 NOTE — Patient Instructions (Addendum)
Thank you for coming to the clinic today.  Continue Ferrous Sulfate 325mg  (Fe 65) one daily with food, improved absorption if taken with vitamin C (asorbic acid) check OTC for a combination or just the supplement  Keep up the good work with Diabetes  Will stay tuned from [facility] and Dr Sampson GoonFitzgerald if any other updates.  Let me know about any rx changes required. FYI you do have a printed rx for Touejo from the hospital.  DUE for FASTING BLOOD WORK (no food or drink after midnight before the lab appointment, only water or coffee without cream/sugar on the morning of)  SCHEDULE "Lab Only" visit in the morning at the clinic for lab draw in 3 months  - Make sure Lab Only appointment is at about 1 week before your next appointment, so that results will be available  For Lab Results, once available within 2-3 days of blood draw, you can can log in to MyChart online to view your results and a brief explanation. Also, we can discuss results at next follow-up visit.   Please schedule a Follow-up Appointment to: Return in about 3 months (around 07/09/2017) for Annual Physical.  If you have any other questions or concerns, please feel free to call the clinic or send a message through MyChart. You may also schedule an earlier appointment if necessary.  Additionally, you may be receiving a survey about your experience at our clinic within a few days to 1 week by e-mail or mail. We value your feedback.  Saralyn PilarAlexander Zendayah Hardgrave, DO [facility]

## 2017-04-08 NOTE — Assessment & Plan Note (Signed)
Resolved Shingles outbreak, L flank / breast, now lesions resolving Hospitalized 6.2018 [facility] Goon[facility]itzgerald following, treated with Valtrex outpatient

## 2017-04-08 NOTE — Progress Notes (Addendum)
Subjective:    Patient ID: Patricia [name], female    DOB: 1966/03/05, 51 y.o.   MRN: [id number]  Patricia [name] is a 51 y.o. female presenting on 04/08/2017 for Diabetes   HPI   CHRONIC DM, Type 2, with NEUROPATHY: No new concerns. Thinks he glucose is better controlled, set back with recent illness. - Recent A1c trend 10 > 6.9 > 7.3 and today 7.6 CBGs (OneTouch meter): Avg 120-140s AM / 150-160s PM, High < 270s (occasionally in evening). Checks CBGs 1-3x daily, rarely CBG can drop overnight, eats an evening snack if lower sugar Meds: Metformin 1000mg  BID, Toujeo BID (20-25 in AM between morning and lunch, 10-11am, and then evening dose about 30-40u depending on earlier sugars, uses a sliding scale), Amaryl 4mg  BID (despite advised to DC it on discharge from hospital in 01/2017) Reports good compliance. Tolerating well w/o side-effects Currently on ACEi, on statin Lifestyle: - Diet (improved DM diet, still has late night snack due to concern hypoglycemia) - Exercise (limited currently due to R knee surgery, infection, previously was more active) - Takes ASA, Statin - Admits some neuropathy in feet Denies hypoglycemia, polyuria, visual changes, numbness or tingling.  Hospital Follow-up (>2 months ago) for Meningitis d/t Herpes Zoster (Shingles) Reviewed recent DC summary, admit 6/11 and discharged on 6/15. Completed Acyclovir IV and then Valtrex 1000mg  TID x 14 days, and completed doxycycline. 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 Improved from prior shingles outbreak (L flank and under L breast), did have residual itching. Additional labs showed eosinophilia that was improved but elevated IgE and then referred to [facility] for further lab eval  Microcytic Anemia - Identified in hospital and on follow-up with ID, has had low Hemoglobin for quite some time (back to 2014 on chart review) including low  iron studies with low ferritin. FOBT positive. She was recommended to establish with GI and follow-up here. - Taking Ferrous sulfate 325mg  daily  Additional concern: - Regarding her refills, last time in 10/2016 most refills were printed for 90 day +3 refills, she states there has been difficulty getting these refills coordinated with pharmacy, states that some rx were not due yet and caused timing problem, wants me to check status of other rx today, in future may switch to 30 day rx to avoid confusion - Also some rx were given by hospitalist on discharge   Social History  Substance Use Topics  . Smoking status: Never Smoker  . Smokeless tobacco: Never Used  . Alcohol use No     Comment: rare    Review of Systems Per HPI unless specifically indicated above     Objective:    BP 119/76   Pulse (!) 102   Temp 98.9 F (37.2 C) (Oral)   Resp 16   Ht 5\' 6"  (1.676 m)   Wt 197 lb (89.4 kg)   LMP 07/10/2015 (Approximate)   BMI 31.80 kg/m   Wt Readings from Last 3 Encounters:  04/08/17 197 lb (89.4 kg)  02/06/17 202 lb 11.2 oz (91.9 kg)  02/02/17 199 lb 6.4 oz (90.4 kg)    Physical Exam  Constitutional: She is oriented to person, place, and time. She appears well-developed and well-nourished. No distress.  Well-appearing, comfortable, cooperative, obese  HENT:  Head: Normocephalic and atraumatic.  Mouth/Throat: Oropharynx is clear and moist.  Eyes: Conjunctivae are normal. Right eye exhibits no discharge. Left eye exhibits no  discharge.  Neck: Normal range of motion. Neck supple. No thyromegaly present.  Cardiovascular: Normal rate, regular rhythm, normal heart sounds and intact distal pulses.   No murmur heard. Pulmonary/Chest: Effort normal and breath sounds normal. No respiratory distress. She has no wheezes. She has no rales.  Musculoskeletal: Normal range of motion. She exhibits no edema.  Lymphadenopathy:    She has no cervical adenopathy.  Neurological: She is alert and  oriented to person, place, and time.  Skin: Skin is warm and dry. No rash noted. She is not diaphoretic. No erythema.  Psychiatric: She has a normal mood and affect. Her behavior is normal.  Well groomed, good eye contact, normal speech and thoughts  Nursing note and vitals reviewed.   Results for orders placed or performed during the hospital encounter of 02/02/17  Blood Culture (routine x 2)  Result Value Ref Range   Specimen Description BLOOD R HAND    Special Requests      BOTTLES DRAWN AEROBIC AND ANAEROBIC Blood Culture adequate volume   Culture NO GROWTH 9 DAYS    Report Status 02/11/2017 FINAL   Blood Culture (routine x 2)  Result Value Ref Range   Specimen Description BLOOD L AC    Special Requests      BOTTLES DRAWN AEROBIC AND ANAEROBIC Blood Culture results may not be optimal due to an excessive volume of blood received in culture bottles   Culture NO GROWTH 9 DAYS    Report Status 02/11/2017 FINAL   Urine culture  Result Value Ref Range   Specimen Description URINE, RANDOM    Special Requests NONE    Culture MULTIPLE SPECIES PRESENT, SUGGEST RECOLLECTION (A)    Report Status 02/03/2017 FINAL   CSF culture with Stat gram stain  Result Value Ref Range   Specimen Description CSF    Special Requests NONE    Gram Stain      MANY WBC PRESENT,BOTH PMN AND MONONUCLEAR MANY RED BLOOD CELLS PRESENT NO ORGANISMS SEEN    Culture      NO GROWTH 3 DAYS Performed at [facility], [address] [place], [place] [address]    Report Status 02/05/2017 FINAL   MRSA PCR Screening  Result Value Ref Range   MRSA by PCR NEGATIVE NEGATIVE  Lactic acid, plasma  Result Value Ref Range   Lactic Acid, Venous 0.9 0.5 - 1.9 mmol/L  Comprehensive metabolic panel  Result Value Ref Range   Sodium 136 135 - 145 mmol/L   Potassium 4.6 3.5 - 5.1 mmol/L   Chloride 103 101 - 111 mmol/L   CO2 24 22 - 32 mmol/L   Glucose, Bld 222 (H) 65 - 99 mg/dL   BUN 16 6 - 20 mg/dL    Creatinine, Ser 6.040.92 0.44 - 1.00 mg/dL   Calcium 9.5 8.9 - 54.010.3 mg/dL   Total Protein 7.6 6.5 - 8.1 g/dL   Albumin 3.9 3.5 - 5.0 g/dL   AST 20 15 - 41 U/L   ALT 18 14 - 54 U/L   Alkaline Phosphatase 76 38 - 126 U/L   Total Bilirubin 0.6 0.3 - 1.2 mg/dL   GFR calc non Af Amer >60 >60 mL/min   GFR calc Af Amer >60 >60 mL/min   Anion gap 9 5 - 15  Troponin I  Result Value Ref Range   Troponin I <0.03 <0.03 ng/mL  CBC WITH DIFFERENTIAL  Result Value Ref Range   WBC 6.8 3.6 - 11.0 K/uL  RBC 4.04 3.80 - 5.20 MIL/uL   Hemoglobin 10.2 (L) 12.0 - 16.0 g/dL   HCT 40.9 (L) 81.1 - 91.4 %   MCV 76.0 (L) 80.0 - 100.0 fL   MCH 25.3 (L) 26.0 - 34.0 pg   MCHC 33.3 32.0 - 36.0 g/dL   RDW 78.2 (H) 95.6 - 21.3 %   Platelets 369 150 - 440 K/uL   Neutrophils Relative % 79 %   Neutro Abs 5.4 1.4 - 6.5 K/uL   Lymphocytes Relative 14 %   Lymphs Abs 1.0 1.0 - 3.6 K/uL   Monocytes Relative 5 %   Monocytes Absolute 0.4 0.2 - 0.9 K/uL   Eosinophils Relative 1 %   Eosinophils Absolute 0.1 0 - 0.7 K/uL   Basophils Relative 1 %   Basophils Absolute 0.0 0 - 0.1 K/uL  Lipase, blood  Result Value Ref Range   Lipase 20 11 - 51 U/L  CSF cell count with differential  Result Value Ref Range   Tube # 3    Color, CSF COLORLESS COLORLESS   Appearance, CSF CLEAR (A) CLEAR   RBC Count, CSF 0 0 - 3 /cu mm   WBC, CSF 264 (HH) 0 - 5 /cu mm   Segmented Neutrophils-CSF 8 %   Lymphs, CSF 87 %   Monocyte-Macrophage-Spinal Fluid 8 %   Eosinophils, CSF 1 %  Glucose, CSF  Result Value Ref Range   Glucose, CSF 87 (H) 40 - 70 mg/dL  Protein, CSF  Result Value Ref Range   Total  Protein, CSF 96 (H) 15 - 45 mg/dL  HSV 2 antibody, IgG  Result Value Ref Range   HSV 2 Glycoprotein G Ab, IgG <0.91 0.00 - 0.90 index  Urinalysis, Complete w Microscopic  Result Value Ref Range   Color, Urine YELLOW (A) YELLOW   APPearance HAZY (A) CLEAR   Specific Gravity, Urine 1.023 1.005 - 1.030   pH 5.0 5.0 - 8.0   Glucose, UA  50 (A) NEGATIVE mg/dL   Hgb urine dipstick NEGATIVE NEGATIVE   Bilirubin Urine NEGATIVE NEGATIVE   Ketones, ur 20 (A) NEGATIVE mg/dL   Protein, ur 30 (A) NEGATIVE mg/dL   Nitrite NEGATIVE NEGATIVE   Leukocytes, UA TRACE (A) NEGATIVE   RBC / HPF 0-5 0 - 5 RBC/hpf   WBC, UA 6-30 0 - 5 WBC/hpf   Bacteria, UA NONE SEEN NONE SEEN   Squamous Epithelial / LPF 6-30 (A) NONE SEEN  Pathologist smear review  Result Value Ref Range   Path Review      Cerebrospinal fluid review is notable for increased inflammatory cells.  87 percent lympocytes, rare basophils and eosinophils, and 8 percent neutrophils. Tube 3 reviewed.  Dr. Excell Seltzer.  Herpes simplex virus (HSV), DNA by PCR Cerebrospinal Fluid  Result Value Ref Range   HSV 1 DNA Negative Negative   HSV 2 DNA Negative Negative  Hemoglobin A1c  Result Value Ref Range   Hgb A1c MFr Bld 7.6 (H) 4.8 - 5.6 %   Mean Plasma Glucose 171 mg/dL  CBC  Result Value Ref Range   WBC 7.2 3.6 - 11.0 K/uL   RBC 3.53 (L) 3.80 - 5.20 MIL/uL   Hemoglobin 9.1 (L) 12.0 - 16.0 g/dL   HCT 08.6 (L) 57.8 - 46.9 %   MCV 77.6 (L) 80.0 - 100.0 fL   MCH 25.7 (L) 26.0 - 34.0 pg   MCHC 33.1 32.0 - 36.0 g/dL   RDW 62.9 (H) 52.8 - 41.3 %  Platelets 333 150 - 440 K/uL  Basic metabolic panel  Result Value Ref Range   Sodium 138 135 - 145 mmol/L   Potassium 4.0 3.5 - 5.1 mmol/L   Chloride 107 101 - 111 mmol/L   CO2 24 22 - 32 mmol/L   Glucose, Bld 211 (H) 65 - 99 mg/dL   BUN 17 6 - 20 mg/dL   Creatinine, Ser 1.61 0.44 - 1.00 mg/dL   Calcium 8.3 (L) 8.9 - 10.3 mg/dL   GFR calc non Af Amer >60 >60 mL/min   GFR calc Af Amer >60 >60 mL/min   Anion gap 7 5 - 15  Glucose, capillary  Result Value Ref Range   Glucose-Capillary 147 (H) 65 - 99 mg/dL  HIV antibody  Result Value Ref Range   HIV Screen 4th Generation wRfx Non Reactive Non Reactive  Miscellaneous [facility] test (send-out)  Result Value Ref Range   [facility] test code [id number]    [facility] test name vzv    Misc  [facility] result COMMENT   Enterovirus pcr  Result Value Ref Range   Enterovirus PCR Negative Negative  Glucose, capillary  Result Value Ref Range   Glucose-Capillary 202 (H) 65 - 99 mg/dL   Comment 1 Notify RN   Glucose, capillary  Result Value Ref Range   Glucose-Capillary 152 (H) 65 - 99 mg/dL  Glucose, capillary  Result Value Ref Range   Glucose-Capillary 60 (L) 65 - 99 mg/dL  Glucose, capillary  Result Value Ref Range   Glucose-Capillary 86 65 - 99 mg/dL  Glucose, capillary  Result Value Ref Range   Glucose-Capillary 58 (L) 65 - 99 mg/dL  Glucose, capillary  Result Value Ref Range   Glucose-Capillary 69 65 - 99 mg/dL  Glucose, capillary  Result Value Ref Range   Glucose-Capillary 135 (H) 65 - 99 mg/dL   Comment 1 Notify RN   Glucose, capillary  Result Value Ref Range   Glucose-Capillary 57 (L) 65 - 99 mg/dL   Comment 1 Notify RN   Glucose, capillary  Result Value Ref Range   Glucose-Capillary 86 65 - 99 mg/dL  Glucose, capillary  Result Value Ref Range   Glucose-Capillary 159 (H) 65 - 99 mg/dL  Glucose, capillary  Result Value Ref Range   Glucose-Capillary 129 (H) 65 - 99 mg/dL  Glucose, capillary  Result Value Ref Range   Glucose-Capillary 195 (H) 65 - 99 mg/dL  Glucose, capillary  Result Value Ref Range   Glucose-Capillary 203 (H) 65 - 99 mg/dL   Comment 1 Notify RN    Comment 2 Document in Chart   Glucose, capillary  Result Value Ref Range   Glucose-Capillary 189 (H) 65 - 99 mg/dL   Comment 1 Notify RN   Glucose, capillary  Result Value Ref Range   Glucose-Capillary 168 (H) 65 - 99 mg/dL   Comment 1 Notify RN   Glucose, capillary  Result Value Ref Range   Glucose-Capillary 198 (H) 65 - 99 mg/dL  Glucose, capillary  Result Value Ref Range   Glucose-Capillary 206 (H) 65 - 99 mg/dL  Glucose, capillary  Result Value Ref Range   Glucose-Capillary 138 (H) 65 - 99 mg/dL  Glucose, capillary  Result Value Ref Range   Glucose-Capillary 226 (H) 65 - 99  mg/dL  Glucose, capillary  Result Value Ref Range   Glucose-Capillary 191 (H) 65 - 99 mg/dL  Glucose, capillary  Result Value Ref Range   Glucose-Capillary 249 (H) 65 - 99 mg/dL  Assessment & Plan:   Problem List Items Addressed This Visit    RESOLVED: Herpes zoster meningitis    Resolved Shingles outbreak, L flank / breast, now lesions resolving Hospitalized 6.2018 confirmed on CSF studies S/p [facility] ID Dr Sampson Goon following, treated with Valtrex outpatient      Relevant Medications   clotrimazole (LOTRIMIN) 1 % cream   Controlled type 2 diabetes mellitus with neuropathy (HCC) - Primary    Remains mostly controlled, A1c slight inc 7.3 to 7.6 today - Complication with mild neuropathy  Plan: 1. Continue Toujeo split dose AM/PM, Metformin 1000mg  BID - Will confirm with her Glimepiride instructions as was DC'd in hospital on discharge, but she is taking it still - Suspect would do well on GLP1 but never heard back if would like to proceed cost/coverage 2. Encouraged work on improving DM diet, eventually improve exercise pending knee recovery post-op 3. Continue ASA, statin, ACEi - due for DM eye exam, handout given 4. Follow-up 3 months for annual phys and fasting labs      Relevant Medications   glimepiride (AMARYL) 4 MG tablet   Anemia    Chronic microcytic anemia, iron deficiency concern chronic blood loss - No significant prior work-up, do not see record of colonoscopy if done in 2017 as charted, per ID had FOBT positive recently after discharge from hospital - Low Hgb, low ferritin  Plan: 1. Discussion today on anemia - agree with continuing ferrous sulfate 325mg  and can add vitamin C / ascorbic acid to help absorption 2. Will contact patient back to clarify her colonoscopy history - if actually not obtained as indicated by health maintenance, then will stress importance of further GI work-up for blood loss anemia 3. Order future labs and iron studies for 3 months for  Annual Physical - if unable to locate GI source then can consider referral to Hematology  **Update 04/09/17** called patient back to clarify colonoscopy as charted, without report. She states that this was incorrect and she actually did the stool cards that were sent to her and these were negative for blood in 2017. She has never had a colonoscopy. She does admit to having chronic anemia for >4 years since 2014 as chart review indicates. And given the positive FOBT in hospital, and persistent anemia, I strongly advised her that referral to GI for colonoscopy is a wise next step. She understands and agrees, request referral now, and we can follow-up as scheduled in 3 months.      Relevant Medications   ferrous sulfate 325 (65 FE) MG tablet      Meds ordered this encounter  Medications  . clotrimazole (LOTRIMIN) 1 % cream    Sig: Apply topically.  . ferrous sulfate 325 (65 FE) MG tablet    Sig: Take 325 mg by mouth daily.  Marland Kitchen glimepiride (AMARYL) 4 MG tablet    Follow up plan: Return in about 3 months (around 07/09/2017) for Annual Physical.  Saralyn Pilar, DO [facility] 04/08/2017, 11:11 PM

## 2017-04-08 NOTE — Assessment & Plan Note (Signed)
Chronic microcytic anemia, iron deficiency concern chronic blood loss - No significant prior work-up, do not see record of colonoscopy if done in 2017 as charted, per ID had FOBT positive recently after discharge from hospital - Low Hgb, low ferritin  Plan: 1. Discussion today on anemia - agree with continuing ferrous sulfate 325mg  and can add vitamin C / ascorbic acid to help absorption 2. Will contact patient back to clarify her colonoscopy history - if actually not obtained as indicated by health maintenance, then will stress importance of further GI work-up for blood loss anemia 3. Order future labs and iron studies for 3 months for Annual Physical - if unable to locate GI source then can consider referral to Hematology

## 2017-04-09 NOTE — Addendum Note (Signed)
Addended by: Smitty CordsKARAMALEGOS, Audia Amick J on: 04/09/2017 10:13 AM   Modules accepted: Orders

## 2017-05-12 ENCOUNTER — Encounter (INDEPENDENT_AMBULATORY_CARE_PROVIDER_SITE_OTHER): Payer: Self-pay

## 2017-05-12 ENCOUNTER — Other Ambulatory Visit
Admission: RE | Admit: 2017-05-12 | Discharge: 2017-05-12 | Disposition: A | Discharge status: Home / Self Care | Payer: BC Managed Care – PPO | Source: Ambulatory Visit | Attending: Gastroenterology | Admitting: Gastroenterology

## 2017-05-12 ENCOUNTER — Encounter: Payer: Self-pay | Admitting: Gastroenterology

## 2017-05-12 ENCOUNTER — Ambulatory Visit (INDEPENDENT_AMBULATORY_CARE_PROVIDER_SITE_OTHER): Payer: BC Managed Care – PPO | Admitting: Gastroenterology

## 2017-05-12 ENCOUNTER — Other Ambulatory Visit: Payer: Self-pay

## 2017-05-12 VITALS — BP 134/87 | HR 94 | Temp 98.6°F | Ht 66.0 in | Wt 205.0 lb

## 2017-05-12 DIAGNOSIS — D509 Iron deficiency anemia, unspecified: Secondary | ICD-10-CM

## 2017-05-12 DIAGNOSIS — Z0000 Encounter for general adult medical examination without abnormal findings: Secondary | ICD-10-CM | POA: Diagnosis not present

## 2017-05-12 DIAGNOSIS — D500 Iron deficiency anemia secondary to blood loss (chronic): Secondary | ICD-10-CM | POA: Insufficient documentation

## 2017-05-12 DIAGNOSIS — E1140 Type 2 diabetes mellitus with diabetic neuropathy, unspecified: Secondary | ICD-10-CM | POA: Insufficient documentation

## 2017-05-12 DIAGNOSIS — E1169 Type 2 diabetes mellitus with other specified complication: Secondary | ICD-10-CM | POA: Diagnosis not present

## 2017-05-12 DIAGNOSIS — D508 Other iron deficiency anemias: Secondary | ICD-10-CM

## 2017-05-12 DIAGNOSIS — E785 Hyperlipidemia, unspecified: Secondary | ICD-10-CM | POA: Diagnosis not present

## 2017-05-12 LAB — COMPREHENSIVE METABOLIC PANEL
ALBUMIN: 4.1 g/dL (ref 3.5–5.0)
ALT: 20 U/L (ref 14–54)
ANION GAP: 10 (ref 5–15)
AST: 24 U/L (ref 15–41)
Alkaline Phosphatase: 76 U/L (ref 38–126)
BUN: 32 mg/dL — ABNORMAL HIGH (ref 6–20)
CHLORIDE: 106 mmol/L (ref 101–111)
CO2: 23 mmol/L (ref 22–32)
Calcium: 9.3 mg/dL (ref 8.9–10.3)
Creatinine, Ser: 0.98 mg/dL (ref 0.44–1.00)
GFR calc non Af Amer: >60 mL/min (ref >60)
Glucose, Bld: 125 mg/dL — ABNORMAL HIGH (ref 65–99)
Potassium: 4.5 mmol/L (ref 3.5–5.1)
Sodium: 139 mmol/L (ref 135–145)
Total Bilirubin: 0.4 mg/dL (ref 0.3–1.2)
Total Protein: 7.3 g/dL (ref 6.5–8.1)

## 2017-05-12 LAB — LIPID PANEL
Cholesterol: 184 mg/dL (ref 0–200)
HDL: 59 mg/dL (ref >40)
LDL CALC: 99 mg/dL (ref 0–99)
TRIGLYCERIDES: 132 mg/dL (ref <150)
Total CHOL/HDL Ratio: 3.1 RATIO
VLDL: 26 mg/dL (ref 0–40)

## 2017-05-12 LAB — CBC WITH DIFFERENTIAL/PLATELET
Basophils Absolute: 0 10*3/uL (ref 0–0.1)
Basophils Relative: 1 %
EOS ABS: 0.5 10*3/uL (ref 0–0.7)
EOS PCT: 7 %
HCT: 31.6 % — ABNORMAL LOW (ref 35.0–47.0)
Hemoglobin: 10.7 g/dL — ABNORMAL LOW (ref 12.0–16.0)
LYMPHS ABS: 2.6 10*3/uL (ref 1.0–3.6)
Lymphocytes Relative: 37 %
MCH: 27.6 pg (ref 26.0–34.0)
MCHC: 33.9 g/dL (ref 32.0–36.0)
MCV: 81.5 fL (ref 80.0–100.0)
MONO ABS: 0.5 10*3/uL (ref 0.2–0.9)
MONOS PCT: 7 %
Neutro Abs: 3.5 10*3/uL (ref 1.4–6.5)
Neutrophils Relative %: 50 %
PLATELETS: 379 10*3/uL (ref 150–440)
RBC: 3.87 MIL/uL (ref 3.80–5.20)
RDW: 15.9 % — ABNORMAL HIGH (ref 11.5–14.5)
WBC: 7 10*3/uL (ref 3.6–11.0)

## 2017-05-12 LAB — IRON AND TIBC
Iron: 41 ug/dL (ref 28–170)
Saturation Ratios: 9 % — ABNORMAL LOW (ref 10.4–31.8)
TIBC: 446 ug/dL (ref 250–450)
UIBC: 405 ug/dL

## 2017-05-12 LAB — FERRITIN: Ferritin: 8 ng/mL — ABNORMAL LOW (ref 11–307)

## 2017-05-12 LAB — TSH: TSH: 1.604 u[IU]/mL (ref 0.350–4.500)

## 2017-05-12 LAB — VITAMIN B12: Vitamin B-12: 309 pg/mL (ref 180–914)

## 2017-05-12 LAB — HEMOGLOBIN A1C
Hgb A1c MFr Bld: 7.3 % — ABNORMAL HIGH (ref 4.8–5.6)
Mean Plasma Glucose: 162.81 mg/dL

## 2017-05-12 LAB — RETICULOCYTES
RBC.: 3.87 MIL/uL (ref 3.80–5.20)
RETIC COUNT ABSOLUTE: 50.3 10*3/uL (ref 19.0–183.0)
Retic Ct Pct: 1.3 % (ref 0.4–3.1)

## 2017-05-12 LAB — FOLATE: FOLATE: 25 ng/mL (ref >5.9)

## 2017-05-12 NOTE — Addendum Note (Signed)
Addended by: Avie Arenas on: 05/12/2017 04:45 PM   Modules accepted: Orders, SmartSet

## 2017-05-12 NOTE — Progress Notes (Signed)
Cephas Darby, MD [address]  [place], [place] [address]  Main: [phone]  Fax: [phone]    Gastroenterology Consultation  Referring Provider:     Nobie Putnam * Primary Care Physician:  Olin Hauser, DO Primary Gastroenterologist:  Dr. Cephas Darby Reason for Consultation:     Iron deficiency anemia        HPI:   Patricia [name] is a 51 y.o. y/o female referred by Dr. Parks Ranger, Devonne Doughty, DO  for consultation & management of iron deficiency anemia. She reports heart burn, intermittently, has been on pepcid in AM for years, added prilosec in 11/2016 after CT chest showed mildly patulous thoracic osephagus. She reports constipation, on oral iron and stool softener helps. She otherwise denies any other GI symptoms. Denies abdominal pain, n/v/rectal bleeding, diarrhea, dysphagia, weight loss. She reports having an infected lesion near stomach that was surgically removed. Never had an EGD before.  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  Hb nadir 7.8 in 07/2016, most recent 10.9 in 03/2017 Ferritin 3, 10/29/2016, 7 in 02/27/2017 Hemoccult +ve on 03/13/2017 Denies any fam h/o GI malignancy Denies GI surgeries Denies ETOH, IVDA LMP 3 years ago, UA negative in 01/2017 GI Procedures: None  Past Medical History:  Diagnosis Date  . Anemia   . Arthritis   . Complication of anesthesia 12/13/2015   aspiration after breathing tube removed after several surgeries  . Depression   . Diabetes mellitus without complication ([place])   . GERD (gastroesophageal reflux disease)   . Hyperlipidemia   . Hypertension   . Infection 2012   right knee infection, required arthroscopy, long term antibiotic treatment, aspiration therapy    Past Surgical History:  Procedure  Laterality Date  . CHOLECYSTECTOMY    . KNEE ARTHROSCOPY Right 11/30/2015   Procedure: ARTHROSCOPY KNEE;  Surgeon: Hessie Knows, MD;  Location: [facility] ORS;  Service: Orthopedics;  Laterality: Right;  . KNEE ARTHROSCOPY Right 12/13/2015   Procedure: ARTHROSCOPY KNEE AND DEBRIDEMENT;  Surgeon: Hessie Knows, MD;  Location: [facility] ORS;  Service: Orthopedics;  Laterality: Right;  . MRSA abdominal wall    . Right knee arthroscopic    . TOTAL KNEE ARTHROPLASTY Right 08/08/2016   Procedure: TOTAL KNEE ARTHROPLASTY;  Surgeon: Hessie Knows, MD;  Location: [facility] ORS;  Service: Orthopedics;  Laterality: Right;  . TUBAL LIGATION  1997    Prior to Admission medications   Medication Sig Start Date End Date Taking? Authorizing Provider  acetaminophen (TYLENOL 8 HOUR ARTHRITIS PAIN) 650 MG CR tablet Take 650 mg by mouth every 8 (eight) hours as needed for pain.    [provider]  albuterol (PROVENTIL HFA;VENTOLIN HFA) 108 (90 Base) MCG/ACT inhaler Inhale 2 puffs into the lungs every 6 (six) hours as needed for wheezing or shortness of breath. 12/15/16   Schuyler Amor, MD  aspirin EC 81 MG tablet Take 81 mg by mouth daily.     [provider]  atorvastatin (LIPITOR) 10 MG tablet Take 1 tablet (10 mg total) by mouth daily. 10/31/16   Karamalegos, Devonne Doughty, DO  blood glucose meter kit and supplies KIT Dispense based on patient and insurance preference. Use up to four times daily as directed. (FOR ICD-9 250.00, 250.01). 09/05/15   Krebs, Genevie Cheshire, NP  clotrimazole (LOTRIMIN) 1 %  cream Apply topically. 03/30/17 03/30/18  [provider]  ferrous sulfate 325 (65 FE) MG tablet Take 325 mg by mouth daily. 02/27/17 02/27/18  [provider]  FLUoxetine (PROZAC) 40 MG capsule Take 1 capsule (40 mg total) by mouth daily. 10/31/16   Karamalegos, Devonne Doughty, DO  glimepiride (AMARYL) 4 MG tablet  02/01/17   [provider]  glucose blood (ONE TOUCH ULTRA TEST) test strip CHECK BLOOD GLUCOSE  UP TO FOUR TIMES DAILY AS DIRECTED 10/28/16   Parks Ranger, Devonne Doughty, DO  ibuprofen (ADVIL,MOTRIN) 400 MG tablet Take 1 tablet (400 mg total) by mouth every 6 (six) hours as needed for moderate pain. 02/06/17   Gouru, Illene Silver, MD  Insulin Glargine (TOUJEO SOLOSTAR) 300 UNIT/ML SOPN Inject 15 Units into the skin daily. 20 units in the morning and 40 units at bedtime 02/06/17   Gouru, Aruna, MD  Insulin Pen Needle (RELION PEN NEEDLES) 31G X 6 MM MISC 1 each by Does not apply route daily. Use with Toujeo insulin as directed. 10/29/16   Karamalegos, Devonne Doughty, DO  lisinopril (PRINIVIL,ZESTRIL) 20 MG tablet Take 1 tablet (20 mg total) by mouth daily. 12/03/16   Karamalegos, Devonne Doughty, DO  loratadine (CLARITIN) 10 MG tablet Take 1 tablet (10 mg total) by mouth daily. 12/15/16 12/15/17  Schuyler Amor, MD  metFORMIN (GLUCOPHAGE) 1000 MG tablet Take 1 tablet (1,000 mg total) by mouth 2 (two) times daily with a meal. 10/31/16   Karamalegos, Devonne Doughty, DO  metoprolol tartrate (LOPRESSOR) 25 MG tablet Take 1 tablet (25 mg total) by mouth 2 (two) times daily. 10/31/16   Karamalegos, Devonne Doughty, DO  omeprazole (PRILOSEC) 20 MG capsule Take 1 capsule (20 mg total) by mouth 2 (two) times daily before a meal. 12/18/16   Karamalegos, Alexander J, DO  ONETOUCH DELICA LANCETS 00X MISC USE TO CHECK BLOOD GLUCOSE UP TO FOUR TIMES DAILY AS DIRECTED 12/18/15   Krebs, Genevie Cheshire, NP  valACYclovir (VALTREX) 1000 MG tablet Take 1 tablet (1,000 mg total) by mouth 3 (three) times daily. 02/06/17   Nicholes Mango, MD    Family History  Problem Relation Age of Onset  . Diabetes Mother   . CAD Mother   . Diabetes Father   . Hyperlipidemia Father      Social History  Substance Use Topics  . Smoking status: Never Smoker  . Smokeless tobacco: Never Used  . Alcohol use No     Comment: rare    Allergies as of 05/12/2017  . (No Known Allergies)    Review of Systems:    All systems reviewed and negative except where noted in  HPI.   Physical Exam:  BP 134/87   Pulse 94   Temp 98.6 F (37 C) (Oral)   Ht '5\' 6"'$  (1.676 m)   Wt 205 lb (93 kg)   LMP 07/10/2015 (Approximate)   BMI 33.09 kg/m  Patient's last menstrual period was 07/10/2015 (approximate).  General:   Alert,  Well-developed, well-nourished, pleasant and cooperative in NAD Head:  Normocephalic and atraumatic. Eyes:  Sclera clear, no icterus.   Conjunctiva pink. Ears:  Normal auditory acuity. Nose:  No deformity, discharge, or lesions. Mouth:  No deformity or lesions,oropharynx pink & moist. Neck:  Supple; no masses or thyromegaly. Lungs:  Respirations even and unlabored.  Clear throughout to auscultation.   No wheezes, crackles, or rhonchi. No acute distress. Heart:  Regular rate and rhythm; no murmurs, clicks, rubs, or gallops. Abdomen:  Normal bowel sounds.  No bruits.  Soft, mild to moderate tenderness in epigastric region and non-distended without masses, hepatosplenomegaly or hernias noted.  No guarding or rebound tenderness.   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 Extremities:  No clubbing or edema.  No cyanosis. Neurologic:  Alert and oriented x3;  grossly normal neurologically. Skin:  Intact without significant lesions or rashes. No jaundice. Lymph Nodes:  No significant cervical adenopathy. Psych:  Alert and cooperative. Normal mood and affect.  Imaging Studies: None  Assessment and Plan:   Patricia [name] is a 51 y.o. y/o female with chronic IDA, unclear etiology. Rule out GI source. Continue oral iron, increase to 2pills daily, add miralax if needed. Recheck iron stores, CBC, B12 and folate. Has epigastric tenderness on exam Schedule EGD and colonosocpy to evaluate for GI source. If negative, plan for VCE  I have discussed alternative options, risks & benefits,  which include, but are not limited to, bleeding, infection,  perforation,respiratory complication & drug reaction.  The patient agrees with this plan & written consent will be obtained.      Follow up in 67month   RCephas Darby MD

## 2017-06-16 ENCOUNTER — Encounter: Payer: Self-pay | Admitting: *Deleted

## 2017-06-23 NOTE — Discharge Instructions (Signed)
General Anesthesia, Adult, Care After °These instructions provide you with information about caring for yourself after your procedure. Your health care provider may also give you more specific instructions. Your treatment has been planned according to current medical practices, but problems sometimes occur. Call your health care provider if you have any problems or questions after your procedure. °What can I expect after the procedure? °After the procedure, it is common to have: °· Vomiting. °· A sore throat. °· Mental slowness. ° °It is common to feel: °· Nauseous. °· Cold or shivery. °· Sleepy. °· Tired. °· Sore or achy, even in parts of your body where you did not have surgery. ° °Follow these instructions at home: °For at least 24 hours after the procedure: °· Do not: °? Participate in activities where you could fall or become injured. °? Drive. °? Use heavy machinery. °? Drink alcohol. °? Take sleeping pills or medicines that cause drowsiness. °? Make important decisions or sign legal documents. °? Take care of children on your own. °· Rest. °Eating and drinking °· If you vomit, drink water, juice, or soup when you can drink without vomiting. °· Drink enough fluid to keep your urine clear or pale yellow. °· Make sure you have little or no nausea before eating solid foods. °· Follow the diet recommended by your health care provider. °General instructions °· Have a responsible adult stay with you until you are awake and alert. °· Return to your normal activities as told by your health care provider. Ask your health care provider what activities are safe for you. °· Take over-the-counter and prescription medicines only as told by your health care provider. °· If you smoke, do not smoke without supervision. °· Keep all follow-up visits as told by your health care provider. This is important. °Contact a health care provider if: °· You continue to have nausea or vomiting at home, and medicines are not helpful. °· You  cannot drink fluids or start eating again. °· You cannot urinate after 8-12 hours. °· You develop a skin rash. °· You have fever. °· You have increasing redness at the site of your procedure. °Get help right away if: °· You have difficulty breathing. °· You have chest pain. °· You have unexpected bleeding. °· You feel that you are having a life-threatening or urgent problem. °This information is not intended to replace advice given to you by your health care provider. Make sure you discuss any questions you have with your health care provider. °Document Released: 11/17/2000 Document Revised: 01/14/2016 Document Reviewed: 07/26/2015 °Elsevier Interactive Patient Education © 2018 [employer] Inc. ° °

## 2017-06-24 ENCOUNTER — Encounter
Admission: RE | Disposition: A | Discharge status: Home / Self Care | Payer: Self-pay | Source: Ambulatory Visit | Attending: Gastroenterology

## 2017-06-24 ENCOUNTER — Ambulatory Visit
Admission: RE | Admit: 2017-06-24 | Discharge: 2017-06-24 | Disposition: A | Discharge status: Home / Self Care | Payer: BC Managed Care – PPO | Source: Ambulatory Visit | Attending: Gastroenterology | Admitting: Gastroenterology

## 2017-06-24 ENCOUNTER — Ambulatory Visit: Payer: BC Managed Care – PPO | Admitting: Anesthesiology

## 2017-06-24 DIAGNOSIS — D500 Iron deficiency anemia secondary to blood loss (chronic): Secondary | ICD-10-CM

## 2017-06-24 DIAGNOSIS — I10 Essential (primary) hypertension: Secondary | ICD-10-CM | POA: Insufficient documentation

## 2017-06-24 DIAGNOSIS — F329 Major depressive disorder, single episode, unspecified: Secondary | ICD-10-CM | POA: Insufficient documentation

## 2017-06-24 DIAGNOSIS — D509 Iron deficiency anemia, unspecified: Secondary | ICD-10-CM | POA: Diagnosis present

## 2017-06-24 DIAGNOSIS — Z7982 Long term (current) use of aspirin: Secondary | ICD-10-CM | POA: Diagnosis not present

## 2017-06-24 DIAGNOSIS — Z79899 Other long term (current) drug therapy: Secondary | ICD-10-CM | POA: Diagnosis not present

## 2017-06-24 DIAGNOSIS — K449 Diaphragmatic hernia without obstruction or gangrene: Secondary | ICD-10-CM | POA: Diagnosis not present

## 2017-06-24 DIAGNOSIS — K228 Other specified diseases of esophagus: Secondary | ICD-10-CM

## 2017-06-24 DIAGNOSIS — Z794 Long term (current) use of insulin: Secondary | ICD-10-CM | POA: Diagnosis not present

## 2017-06-24 DIAGNOSIS — K2270 Barrett's esophagus without dysplasia: Secondary | ICD-10-CM | POA: Diagnosis not present

## 2017-06-24 DIAGNOSIS — K219 Gastro-esophageal reflux disease without esophagitis: Secondary | ICD-10-CM | POA: Diagnosis not present

## 2017-06-24 DIAGNOSIS — E1140 Type 2 diabetes mellitus with diabetic neuropathy, unspecified: Secondary | ICD-10-CM | POA: Insufficient documentation

## 2017-06-24 DIAGNOSIS — E785 Hyperlipidemia, unspecified: Secondary | ICD-10-CM | POA: Diagnosis not present

## 2017-06-24 HISTORY — DX: Dyspnea, unspecified: R06.00

## 2017-06-24 HISTORY — PX: COLONOSCOPY WITH PROPOFOL: SHX5780

## 2017-06-24 HISTORY — PX: ESOPHAGOGASTRODUODENOSCOPY (EGD) WITH PROPOFOL: SHX5813

## 2017-06-24 LAB — GLUCOSE, CAPILLARY
GLUCOSE-CAPILLARY: 108 mg/dL — AB (ref 65–99)
Glucose-Capillary: 131 mg/dL — ABNORMAL HIGH (ref 65–99)

## 2017-06-24 SURGERY — COLONOSCOPY WITH PROPOFOL
Anesthesia: General | Wound class: Clean Contaminated

## 2017-06-24 MED ORDER — LIDOCAINE HCL (CARDIAC) 20 MG/ML IV SOLN
INTRAVENOUS | Status: DC | PRN
  Administered 2017-06-24: 40 mg via INTRAVENOUS

## 2017-06-24 MED ORDER — ACETAMINOPHEN 160 MG/5ML PO SOLN: 325.0000 mg | ORAL | Status: DC | PRN

## 2017-06-24 MED ORDER — GLYCOPYRROLATE 0.2 MG/ML IJ SOLN
INTRAMUSCULAR | Status: DC | PRN
  Administered 2017-06-24: 0.2 mg via INTRAVENOUS

## 2017-06-24 MED ORDER — LACTATED RINGERS IV SOLN
10.0000 mL/h | INTRAVENOUS | Status: DC
  Administered 2017-06-24: 10 mL/h via INTRAVENOUS

## 2017-06-24 MED ORDER — ONDANSETRON HCL 4 MG/2ML IJ SOLN: 4.0000 mg | Freq: Once | INTRAMUSCULAR | Status: DC | PRN

## 2017-06-24 MED ORDER — ACETAMINOPHEN 325 MG PO TABS: 325.0000 mg | ORAL_TABLET | ORAL | Status: DC | PRN

## 2017-06-24 MED ORDER — SODIUM CHLORIDE 0.9 % IV SOLN: INTRAVENOUS | Status: DC

## 2017-06-24 MED ORDER — PROPOFOL 10 MG/ML IV BOLUS
INTRAVENOUS | Status: DC | PRN
  Administered 2017-06-24 (×12): 50 mg via INTRAVENOUS

## 2017-06-24 SURGICAL SUPPLY — 35 items

## 2017-06-24 NOTE — Op Note (Signed)
[facility] Gastroenterology Patient Name: Patricia [name] Procedure Date: 06/24/2017 7:53 AM MRN: [id number] Account #: [id number] Date of Birth: Nov 05, 1965 Admit Type: Outpatient Age: 51 Room: [facility] OR ROOM 01 Gender: Female Note Status: Finalized Procedure:            Upper GI endoscopy Indications:          Unexplained iron deficiency anemia Providers:            Lin Landsman MD, MD Referring MD:         Olin Hauser (Referring MD) Medicines:            Monitored Anesthesia Care Complications:        No immediate complications. Estimated blood loss:                        Minimal. Procedure:            Pre-Anesthesia Assessment:                       - Prior to the procedure, a History and Physical was                        performed, and patient medications and allergies were                        reviewed. The patient is competent. The risks and                        benefits of the procedure and the sedation options and                        risks were discussed with the patient. All questions                        were answered and informed consent was obtained.                        Patient identification and proposed procedure were                        verified by the physician, the nurse, the                        anesthesiologist, the anesthetist and the technician in                        the pre-procedure area in the procedure room. Mental                        Status Examination: alert and oriented. Airway                        Examination: normal oropharyngeal airway and neck                        mobility. Respiratory Examination: clear to                        auscultation. CV Examination: normal. Prophylactic  Antibiotics: The patient does not require prophylactic                        antibiotics. Prior Anticoagulants: The patient has                        taken no previous anticoagulant or  antiplatelet agents.                        ASA Grade Assessment: III - A patient with severe                        systemic disease. After reviewing the risks and                        benefits, the patient was deemed in satisfactory                        condition to undergo the procedure. The anesthesia plan                        was to use monitored anesthesia care (MAC). Immediately                        prior to administration of medications, the patient was                        re-assessed for adequacy to receive sedatives. The                        heart rate, respiratory rate, oxygen saturations, blood                        pressure, adequacy of pulmonary ventilation, and                        response to care were monitored throughout the                        procedure. The physical status of the patient was                        re-assessed after the procedure.                       After obtaining informed consent, the endoscope was                        passed under direct vision. Throughout the procedure,                        the patient's blood pressure, pulse, and oxygen                        saturations were monitored continuously. The Olympus                        190 Endoscope [id number]) was introduced through the  mouth, and advanced to the second part of duodenum. The                        upper GI endoscopy was accomplished without difficulty.                        The patient tolerated the procedure well. Findings:      The duodenal bulb and second portion of the duodenum were normal.       Biopsies for histology were taken with a cold forceps for evaluation of       celiac disease.      A medium-sized hiatal hernia was present.      The cardia and gastric fundus were normal on retroflexion.      The gastric body, incisura and gastric antrum were normal. Biopsies were       taken with a cold forceps for Helicobacter pylori  testing.      Two tongues of salmon-colored mucosa were present. No other visible       abnormalities were present. The maximum longitudinal extent of these       esophageal mucosal changes was 1 cm in length. Mucosa was biopsied with       a cold forceps for histology. One specimen bottle was sent to pathology.      The examined esophagus was normal. Impression:           - Normal duodenal bulb and second portion of the                        duodenum. Biopsied.                       - Medium-sized hiatal hernia.                       - Normal gastric body, incisura and antrum. Biopsied.                       - Salmon-colored mucosa suspicious for short-segment                        Barrett's esophagus. Biopsied.                       - Normal esophagus. Recommendation:       - Await pathology results.                       - Proceed with colonoscopy as planned Procedure Code(s):    --- Professional ---                       [phone], Esophagogastroduodenoscopy, flexible, transoral;                        with biopsy, single or multiple Diagnosis Code(s):    --- Professional ---                       K22.8, Other specified diseases of esophagus                       K44.9, Diaphragmatic hernia without obstruction or  gangrene                       D50.9, Iron deficiency anemia, unspecified CPT copyright 2016 American Medical Association. All rights reserved. The codes documented in this report are preliminary and upon coder review may  be revised to meet current compliance requirements. Dr. Ulyess Mort Lin Landsman MD, MD 06/24/2017 8:18:33 AM This report has been signed electronically. Number of Addenda: 0 Note Initiated On: 06/24/2017 7:53 AM      [facility]

## 2017-06-24 NOTE — Anesthesia Postprocedure Evaluation (Signed)
Anesthesia Post Note  Patient: Patricia [name]  Procedure(s) Performed: COLONOSCOPY WITH PROPOFOL (N/A ) ESOPHAGOGASTRODUODENOSCOPY (EGD) WITH PROPOFOL (N/A )  Patient location during evaluation: PACU Anesthesia Type: General Level of consciousness: awake Pain management: pain level controlled Vital Signs Assessment: post-procedure vital signs reviewed and stable Respiratory status: respiratory function stable Cardiovascular status: stable Postop Assessment: no signs of nausea or vomiting Anesthetic complications: no    [name]

## 2017-06-24 NOTE — Anesthesia Procedure Notes (Signed)
Procedure Name: MAC Date/Time: 06/24/2017 7:54 AM Performed by: Janna Arch Pre-anesthesia Checklist: Patient identified, Emergency Drugs available, Suction available and Patient being monitored Patient Re-evaluated:Patient Re-evaluated prior to induction Oxygen Delivery Method: Nasal cannula

## 2017-06-24 NOTE — Anesthesia Preprocedure Evaluation (Signed)
Anesthesia Evaluation  Patient identified by MRN, date of birth, ID band Patient awake    Reviewed: Allergy & Precautions, NPO status , Patient's Chart, lab work & pertinent test results  Airway Mallampati: II  TM Distance: >3 FB     Dental no notable dental hx.    Pulmonary    breath sounds clear to auscultation       Cardiovascular hypertension,  Rhythm:Regular Rate:Normal  HLD   Neuro/Psych Anxiety Depression    GI/Hepatic hiatal hernia, GERD  ,  Endo/Other  diabetesBMI 33   Renal/GU      Musculoskeletal  (+) Arthritis ,   Abdominal   Peds  Hematology  (+) anemia ,   Anesthesia Other Findings   Reproductive/Obstetrics                            Anesthesia Physical Anesthesia Plan  ASA: III  Anesthesia Plan: General   Post-op Pain Management:    Induction: Intravenous  PONV Risk Score and Plan:   Airway Management Planned: Nasal Cannula and Natural Airway  Additional Equipment:   Intra-op Plan:   Post-operative Plan:   Informed Consent: I have reviewed the patients History and Physical, chart, labs and discussed the procedure including the risks, benefits and alternatives for the proposed anesthesia with the patient or authorized representative who has indicated his/her understanding and acceptance.     Plan Discussed with: CRNA  Anesthesia Plan Comments:         Anesthesia Quick Evaluation

## 2017-06-24 NOTE — Op Note (Signed)
[facility] Gastroenterology Patient Name: Patricia [name] Procedure Date: 06/24/2017 7:53 AM MRN: [id number] Account #: [id number] Date of Birth: 1965/11/05 Admit Type: Outpatient Age: 51 Room: [facility] OR ROOM 01 Gender: Female Note Status: Finalized Procedure:            Colonoscopy Indications:          Unexplained iron deficiency anemia Providers:            Lin Landsman MD, MD Referring MD:         Olin Hauser (Referring MD) Medicines:            Monitored Anesthesia Care Complications:        No immediate complications. Estimated blood loss: None. Procedure:            Pre-Anesthesia Assessment:                       - Prior to the procedure, a History and Physical was                        performed, and patient medications and allergies were                        reviewed. The patient is competent. The risks and                        benefits of the procedure and the sedation options and                        risks were discussed with the patient. All questions                        were answered and informed consent was obtained.                        Patient identification and proposed procedure were                        verified by the physician, the nurse, the                        anesthesiologist, the anesthetist and the technician in                        the pre-procedure area in the procedure room. Mental                        Status Examination: alert and oriented. Airway                        Examination: normal oropharyngeal airway and neck                        mobility. Respiratory Examination: clear to                        auscultation. CV Examination: normal. Prophylactic                        Antibiotics: The patient does not require prophylactic  antibiotics. Prior Anticoagulants: The patient has                        taken no previous anticoagulant or antiplatelet agents.              ASA Grade Assessment: III - A patient with severe                        systemic disease. After reviewing the risks and                        benefits, the patient was deemed in satisfactory                        condition to undergo the procedure. The anesthesia plan                        was to use monitored anesthesia care (MAC). Immediately                        prior to administration of medications, the patient was                        re-assessed for adequacy to receive sedatives. The                        heart rate, respiratory rate, oxygen saturations, blood                        pressure, adequacy of pulmonary ventilation, and                        response to care were monitored throughout the                        procedure. The physical status of the patient was                        re-assessed after the procedure.                       After obtaining informed consent, the colonoscope was                        passed under direct vision. Throughout the procedure,                        the patient's blood pressure, pulse, and oxygen                        saturations were monitored continuously. The [place] [id number]) was introduced through the                        anus and advanced to the the cecum, identified by                        appendiceal orifice and ileocecal valve. The  colonoscopy was performed without difficulty. The                        patient tolerated the procedure well. The quality of                        the bowel preparation was evaluated using the BBPS                        [facility] Bowel Preparation Scale) with scores of: Right                        Colon = 2 (minor amount of residual staining, small                        fragments of stool and/or opaque liquid, but mucosa                        seen well), Transverse Colon = 2 (minor amount of                         residual staining, small fragments of stool and/or                        opaque liquid, but mucosa seen well) and Left Colon = 2                        (minor amount of residual staining, small fragments of                        stool and/or opaque liquid, but mucosa seen well). The                        total BBPS score equals 6. The quality of the bowel                        preparation was fair. Findings:      The perianal and digital rectal examinations were normal. Pertinent       negatives include normal sphincter tone and no palpable rectal lesions.      The colon (entire examined portion) appeared normal.      The retroflexed view of the distal rectum and anal verge was normal and       showed no anal or rectal abnormalities.      A few small and large-mouthed diverticula were found in the sigmoid       colon. Impression:           - Preparation of the colon was fair.                       - The entire examined colon is normal.                       - The distal rectum and anal verge are normal on                        retroflexion view.                       -  No specimens collected. Recommendation:       - Discharge patient to home.                       - Resume previous diet today.                       - Continue present medications.                       - Repeat colonoscopy in 5 years for surveillance due to                        fair prep.                       - Restart orl iron 389m BID, If not responding to iron                        replacement in 347month recommend VCE                       - Return to my office as previously scheduled. Procedure Code(s):    --- Professional ---                       [phone], flexible; diagnostic, including                        collection of specimen(s) by brushing or washing, when                        performed (separate procedure) Diagnosis Code(s):    --- Professional ---                        D50.9, Iron deficiency anemia, unspecified CPT copyright 2016 American Medical Association. All rights reserved. The codes documented in this report are preliminary and upon coder review may  be revised to meet current compliance requirements. Dr. RoUlyess MortoLin LandsmanD, MD 06/24/2017 8:44:20 AM This report has been signed electronically. Number of Addenda: 0 Note Initiated On: 06/24/2017 7:53 AM Scope Withdrawal Time: 0 hours 10 minutes 36 seconds  Total Procedure Duration: 0 hours 18 minutes 16 seconds       [facility]

## 2017-06-24 NOTE — Transfer of Care (Signed)
Immediate Anesthesia Transfer of Care Note  Patient: Patricia [name]  Procedure(s) Performed: COLONOSCOPY WITH PROPOFOL (N/A ) ESOPHAGOGASTRODUODENOSCOPY (EGD) WITH PROPOFOL (N/A )  Patient Location: PACU  Anesthesia Type: General  Level of Consciousness: awake, alert  and patient cooperative  Airway and Oxygen Therapy: Patient Spontanous Breathing and Patient connected to supplemental oxygen  Post-op Assessment: Post-op Vital signs reviewed, Patient's Cardiovascular Status Stable, Respiratory Function Stable, Patent Airway and No signs of Nausea or vomiting  Post-op Vital Signs: Reviewed and stable  Complications: No apparent anesthesia complications

## 2017-06-24 NOTE — H&P (Signed)
Cephas Darby, MD [address]  [place]  [place], [place] [address]  Main: [phone]  Fax: [phone] Pager: [phone]  Primary Care Physician:  Olin Hauser, DO Primary Gastroenterologist:  Dr. Cephas Darby  Pre-Procedure History & Physical: HPI:  Patricia [name] is a 51 y.o. female is here for an endoscopy and colonoscopy.   Past Medical History:  Diagnosis Date  . Anemia   . Arthritis   . Complication of anesthesia 12/13/2015   aspiration after breathing tube removed after several surgeries  . Depression   . Diabetes mellitus without complication ([facility])   . Dyspnea   . GERD (gastroesophageal reflux disease)   . Hyperlipidemia   . Hypertension   . Infection 2012   right knee infection, required arthroscopy, long term antibiotic treatment, aspiration therapy  . Neuropathy    feet    Past Surgical History:  Procedure Laterality Date  . CHOLECYSTECTOMY    . KNEE ARTHROSCOPY Right 11/30/2015   Procedure: ARTHROSCOPY KNEE;  Surgeon: Hessie Knows, MD;  Location: [facility] ORS;  Service: Orthopedics;  Laterality: Right;  . KNEE ARTHROSCOPY Right 12/13/2015   Procedure: ARTHROSCOPY KNEE AND DEBRIDEMENT;  Surgeon: Hessie Knows, MD;  Location: [facility] ORS;  Service: Orthopedics;  Laterality: Right;  . MRSA abdominal wall    . Right knee arthroscopic    . TOTAL KNEE ARTHROPLASTY Right 08/08/2016   Procedure: TOTAL KNEE ARTHROPLASTY;  Surgeon: Hessie Knows, MD;  Location: [facility] ORS;  Service: Orthopedics;  Laterality: Right;  . TUBAL LIGATION  1997    Prior to Admission medications   Medication Sig Start Date End Date Taking? Authorizing Provider  acetaminophen (TYLENOL 8 HOUR ARTHRITIS PAIN) 650 MG CR tablet Take 650 mg by mouth every 8 (eight) hours as needed for pain.   Yes [provider]  albuterol (PROVENTIL HFA;VENTOLIN HFA) 108 (90 Base) MCG/ACT inhaler Inhale 2 puffs into the lungs every 6 (six) hours as needed for wheezing or shortness of  breath. 12/15/16  Yes Schuyler Amor, MD  aspirin EC 81 MG tablet Take 81 mg by mouth daily.    Yes [provider]  atorvastatin (LIPITOR) 10 MG tablet Take 1 tablet (10 mg total) by mouth daily. 10/31/16  Yes Karamalegos, Devonne Doughty, DO  ferrous sulfate 325 (65 FE) MG tablet Take 325 mg by mouth daily. 02/27/17 02/27/18 Yes [provider]  FLUoxetine (PROZAC) 40 MG capsule Take 1 capsule (40 mg total) by mouth daily. 10/31/16  Yes Karamalegos, Devonne Doughty, DO  glimepiride (AMARYL) 4 MG tablet  02/01/17  Yes [provider]  ibuprofen (ADVIL,MOTRIN) 400 MG tablet Take 1 tablet (400 mg total) by mouth every 6 (six) hours as needed for moderate pain. 02/06/17  Yes Gouru, Illene Silver, MD  Insulin Glargine (TOUJEO SOLOSTAR) 300 UNIT/ML SOPN Inject 15 Units into the skin daily. 20 units in the morning and 40 units at bedtime Patient taking differently: Inject 15 Units into the skin daily. 20 units in the morning and 40 units at bedtime (06/16/17 currently taking 25 AM, 30 PM) 02/06/17  Yes Gouru, Aruna, MD  lisinopril (PRINIVIL,ZESTRIL) 20 MG tablet Take 1 tablet (20 mg total) by mouth daily. 12/03/16  Yes Karamalegos, Devonne Doughty, DO  loratadine (CLARITIN) 10 MG tablet Take 1 tablet (10 mg total) by mouth daily. 12/15/16 12/15/17 Yes Schuyler Amor, MD  metFORMIN (GLUCOPHAGE) 1000 MG tablet Take 1 tablet (1,000 mg total) by mouth 2 (two) times daily with a meal. 10/31/16  Yes  Karamalegos, Alexander J, DO  metoprolol tartrate (LOPRESSOR) 25 MG tablet Take 1 tablet (25 mg total) by mouth 2 (two) times daily. 10/31/16  Yes Karamalegos, Devonne Doughty, DO  omeprazole (PRILOSEC) 20 MG capsule Take 1 capsule (20 mg total) by mouth 2 (two) times daily before a meal. 12/18/16  Yes Karamalegos, Devonne Doughty, DO  blood glucose meter kit and supplies KIT Dispense based on patient and insurance preference. Use up to four times daily as directed. (FOR ICD-9 250.00, 250.01). 09/05/15   Krebs, Genevie Cheshire, NP    clotrimazole (LOTRIMIN) 1 % cream Apply topically. 03/30/17 03/30/18  [provider]  glucose blood (ONE TOUCH ULTRA TEST) test strip CHECK BLOOD GLUCOSE UP TO FOUR TIMES DAILY AS DIRECTED 10/28/16   Karamalegos, Devonne Doughty, DO  Insulin Pen Needle (RELION PEN NEEDLES) 31G X 6 MM MISC 1 each by Does not apply route daily. Use with Toujeo insulin as directed. 10/29/16   Karamalegos, Devonne Doughty, DO  ONETOUCH DELICA LANCETS 63O MISC USE TO CHECK BLOOD GLUCOSE UP TO FOUR TIMES DAILY AS DIRECTED 12/18/15   Luciana Axe, NP    Allergies as of 05/12/2017  . (No Known Allergies)    Family History  Problem Relation Age of Onset  . Diabetes Mother   . CAD Mother   . Diabetes Father   . Hyperlipidemia Father     Social History   Social History  . Marital status: Married    Spouse name: N/A  . Number of children: N/A  . Years of education: N/A   Occupational History  . 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 Smoking status: Never Smoker  . Smokeless tobacco: Never Used  . Alcohol use No     Comment: rare  . Drug use: No  . Sexual activity: No   Other Topics Concern  . Not on file   Social History Narrative  . No narrative on file    Review of Systems: See HPI, otherwise negative ROS  Physical Exam: BP (!) 146/92   Pulse (!) 118   Temp 97.7 F (36.5 C) (Temporal)   Resp 16   Ht _0  (1.676 m)   Wt 203 lb (92.1 kg)   LMP 07/10/2015 (Approximate)   SpO2 99%   BMI 32.77 kg/m  General:   Alert,  pleasant and cooperative in NAD Head:  Normocephalic and atraumatic. Neck:  Supple; no masses or thyromegaly. Lungs:  Clear throughout to auscultation.    Heart:  Regular rate and rhythm. Abdomen:  Soft, nontender and nondistended. Normal bowel sounds, without guarding, and without rebound.   Neurologic:  Alert and  oriented x4;  grossly normal neurologically.  Impression/Plan: Patricia [name] is here for an endoscopy and colonoscopy to  be performed for IDA  Risks, benefits, limitations, and alternatives regarding  endoscopy and colonoscopy have been reviewed with the patient.  Questions have been answered.  All parties agreeable.   Sherri Sear, MD  06/24/2017, 7:43 AM

## 2017-06-25 ENCOUNTER — Encounter: Payer: Self-pay | Admitting: Gastroenterology

## 2017-06-26 ENCOUNTER — Encounter: Payer: Self-pay | Admitting: Gastroenterology

## 2017-06-29 ENCOUNTER — Encounter: Payer: Self-pay | Admitting: Family Medicine

## 2017-06-29 ENCOUNTER — Telehealth: Payer: Self-pay

## 2017-06-29 DIAGNOSIS — K2270 Barrett's esophagus without dysplasia: Secondary | ICD-10-CM | POA: Insufficient documentation

## 2017-06-29 NOTE — Telephone Encounter (Signed)
Patient has been notified  that biopsies from upper endoscopy revealed that she has Barrett's esophagus and will need repeat EGD in 5 years. Since, both upper endoscopy and colonoscopy came back negative, and we have not identified the source of iron deficiency anemia yet, I recommend we do capsule endoscopy to look for small bowel source.     She will schedule a follow up appt to discuss the capsule endoscopy. What will it see that colonoscopy and upper gi did not show. And the treatment for Barretts Esophagus.  I told her in some cases we do dilation of the esophagus.  Thanks [employer]

## 2017-06-29 NOTE — Telephone Encounter (Signed)
-----   Message from Toney Reilohini Reddy Vanga, MD sent at 06/29/2017 12:43 PM EST ----- Marcelino DusterMichelle  Please notify patient that biopsies from upper endoscopy revealed that she has Barrett's esophagus and will need repeat EGD in 5 years. Since, both upper endoscopy and colonoscopy came back negative, and we have not identified the source of iron deficiency anemia yet, I recommend we do capsule endoscopy to look for small bowel source.  -RV

## 2017-06-30 ENCOUNTER — Telehealth: Payer: Self-pay

## 2017-06-30 NOTE — Telephone Encounter (Signed)
Patient has been informed that capsule study looks for lesions in the small intestines that colonoscopy would not see.  I am sending her patient education on Barretts Esophagus and Capsule Studies.  She will call me when she is ready to schedule.  I also told her that I incorrectly explained treatment for Barretts Esophagus and after reviewing the patient education if she has any questions to call back and I will ask you to address them.  Thanks [employer]

## 2017-07-08 ENCOUNTER — Other Ambulatory Visit: Payer: Self-pay | Admitting: Family Medicine

## 2017-07-08 DIAGNOSIS — K219 Gastro-esophageal reflux disease without esophagitis: Secondary | ICD-10-CM

## 2017-11-15 ENCOUNTER — Other Ambulatory Visit: Payer: Self-pay | Admitting: Family Medicine

## 2017-11-15 DIAGNOSIS — E785 Hyperlipidemia, unspecified: Secondary | ICD-10-CM

## 2017-11-15 DIAGNOSIS — I10 Essential (primary) hypertension: Secondary | ICD-10-CM

## 2017-11-15 DIAGNOSIS — E1140 Type 2 diabetes mellitus with diabetic neuropathy, unspecified: Secondary | ICD-10-CM

## 2017-11-15 DIAGNOSIS — E1169 Type 2 diabetes mellitus with other specified complication: Secondary | ICD-10-CM

## 2017-12-01 ENCOUNTER — Ambulatory Visit: Payer: BC Managed Care – PPO | Admitting: Family Medicine

## 2017-12-02 ENCOUNTER — Ambulatory Visit: Payer: BC Managed Care – PPO | Admitting: Family Medicine

## 2017-12-02 ENCOUNTER — Encounter: Payer: Self-pay | Admitting: Family Medicine

## 2017-12-02 ENCOUNTER — Other Ambulatory Visit: Payer: Self-pay | Admitting: Family Medicine

## 2017-12-02 VITALS — BP 138/88 | HR 92 | Temp 99.5°F | Resp 16 | Ht 66.0 in | Wt 216.0 lb

## 2017-12-02 DIAGNOSIS — E785 Hyperlipidemia, unspecified: Secondary | ICD-10-CM | POA: Diagnosis not present

## 2017-12-02 DIAGNOSIS — F419 Anxiety disorder, unspecified: Secondary | ICD-10-CM

## 2017-12-02 DIAGNOSIS — E1140 Type 2 diabetes mellitus with diabetic neuropathy, unspecified: Secondary | ICD-10-CM

## 2017-12-02 DIAGNOSIS — E1169 Type 2 diabetes mellitus with other specified complication: Secondary | ICD-10-CM | POA: Diagnosis not present

## 2017-12-02 DIAGNOSIS — E669 Obesity, unspecified: Secondary | ICD-10-CM

## 2017-12-02 DIAGNOSIS — Z1239 Encounter for other screening for malignant neoplasm of breast: Secondary | ICD-10-CM

## 2017-12-02 DIAGNOSIS — E1142 Type 2 diabetes mellitus with diabetic polyneuropathy: Secondary | ICD-10-CM

## 2017-12-02 DIAGNOSIS — E66811 Obesity, class 1: Secondary | ICD-10-CM

## 2017-12-02 DIAGNOSIS — Z794 Long term (current) use of insulin: Secondary | ICD-10-CM

## 2017-12-02 DIAGNOSIS — I10 Essential (primary) hypertension: Secondary | ICD-10-CM

## 2017-12-02 DIAGNOSIS — Z1231 Encounter for screening mammogram for malignant neoplasm of breast: Secondary | ICD-10-CM | POA: Diagnosis not present

## 2017-12-02 DIAGNOSIS — K219 Gastro-esophageal reflux disease without esophagitis: Secondary | ICD-10-CM

## 2017-12-02 LAB — POCT GLYCOSYLATED HEMOGLOBIN (HGB A1C): Hemoglobin A1C: 7.1 — AB (ref ?–5.6)

## 2017-12-02 MED ORDER — OMEPRAZOLE 20 MG PO CPDR: 20.0000 mg | DELAYED_RELEASE_CAPSULE | Freq: Two times a day (BID) | ORAL | 3 refills | Status: DC

## 2017-12-02 MED ORDER — LISINOPRIL 20 MG PO TABS: 20.0000 mg | ORAL_TABLET | Freq: Every day | ORAL | 3 refills | Status: DC

## 2017-12-02 MED ORDER — METFORMIN HCL 1000 MG PO TABS: 1000.0000 mg | ORAL_TABLET | Freq: Two times a day (BID) | ORAL | 3 refills | Status: DC

## 2017-12-02 MED ORDER — METOPROLOL TARTRATE 25 MG PO TABS: 25.0000 mg | ORAL_TABLET | Freq: Two times a day (BID) | ORAL | 3 refills | Status: DC

## 2017-12-02 MED ORDER — FLUOXETINE HCL 40 MG PO CAPS: 40.0000 mg | ORAL_CAPSULE | Freq: Every day | ORAL | 3 refills | Status: DC

## 2017-12-02 MED ORDER — SEMAGLUTIDE(0.25 OR 0.5MG/DOS) 2 MG/1.5ML ~~LOC~~ SOPN: 0.5000 mg | PEN_INJECTOR | SUBCUTANEOUS | 2 refills | Status: DC

## 2017-12-02 MED ORDER — INSULIN GLARGINE 300 UNIT/ML ~~LOC~~ SOPN: 20.0000 [IU] | PEN_INJECTOR | Freq: Every day | SUBCUTANEOUS | 2 refills | Status: DC

## 2017-12-02 MED ORDER — ATORVASTATIN CALCIUM 10 MG PO TABS: 10.0000 mg | ORAL_TABLET | Freq: Every day | ORAL | 3 refills | Status: DC

## 2017-12-02 NOTE — Assessment & Plan Note (Addendum)
Co morbid/complication of obesity and diabetes Previously fairly well controlled on Atorvastatin 10mg  daily Advised lifestyle improvement diet, exercise Continue Statin Not due yet for repeat lipids, plan to re-check in 05/2018

## 2017-12-02 NOTE — Progress Notes (Addendum)
Subjective:    Patient ID: Patricia [name], female    DOB: 08-03-1966, 52 y.o.   MRN: [id number]  Patricia [name] is a 52 y.o. female presenting on 12/02/2017 for Diabetes  Patient presents for a same day appointment.  HPI  CHRONIC DM, Type 2, with NEUROPATHY / HYPERLIPIDEMIA OBESITY BMI >34  She is worried today about potential elevated sugar, prior trend 7.6 to 7.3. She otherwise feels fairly well, worried about some spikes in sugar variable readings up to 200 to 400 at times, hard to predict  CBGs (OneTouch meter): No log today. She has avg CBG 120 to 140 AM / 150-160s PM, High < 400 - Admits hypoglycemia episode CBG 55 overnight, symptomatic, very rare. - Checks sugar 1-3x daily, will notify us name of glucometer to send new rx in future, reported change  Meds: Metformin 1000mg  BID (used to take XR), Toujeo BID (20-25 in AM between morning and lunch, 10-11am, and then evening dose about 30-40u depending on earlier sugars, uses a sliding scale), Amaryl 4mg  BID  Reports good compliance. Tolerating well w/o side-effects Currently on ACEi, on statin Lifestyle: - Diet (Trying to improve DM diet, still has late night snack sometimes, skips breakfast eats early lunch, usually 2 meals day, will still have occasional cookie or OJ) - Exercise (limited currently still now with warmer weather plan to walk more, she has limitation with R knee surgery before) - Takes ASA, Statin - Admits some neuropathy in feet has tingling intermittent worse, not taking medicine for it and not seen Podiatry, has some thickening of toenails. Not ready to see podiatrist yet. - Last DM Eye Exam done at [facility] approx 2 weeks ago in March 2019, requested fax copy of report Denies hypoglycemia, polyuria, visual changes, numbness or tingling.  Additional complaint: - Reports some recent flare up pain in left low back gluteal region, seems to be worse prolong sitting. Seems improved at times. Improved on  ibuprofen PRN. Not taking Tylenol regularly.  Health Maintenance:  Breast CA Screening: Due for mammogram screening. Last mammogram result reported 2007 per chart no result available, unsure where. No prior history abnormal mammogram. No known family history of breast cancer. Currently asymptomatic. DUE for new order, to be placed at [facility].   Depression screen [facility] 2/9 12/02/2017 09/05/2015  Decreased Interest 0 0  Down, Depressed, Hopeless 0 0  PHQ - 2 Score 0 0    Social History   Tobacco Use  . Smoking status: Never Smoker  . Smokeless tobacco: Never Used  Substance Use Topics  . Alcohol use: No    Comment: rare  . Drug use: No    Review of Systems Per HPI unless specifically indicated above     Objective:    BP 138/88 (BP Location: Left Arm, Cuff Size: Normal)   Pulse 92   Temp 99.5 F (37.5 C) (Oral)   Resp 16   Ht 5\' 6"  (1.676 m)   Wt 216 lb (98 kg)   LMP 07/10/2015 (Approximate)   BMI 34.86 kg/m   Wt Readings from Last 3 Encounters:  12/02/17 216 lb (98 kg)  06/24/17 203 lb (92.1 kg)  05/12/17 205 lb (93 kg)    Physical Exam  Constitutional: She is oriented to person, place, and time. She appears well-developed and well-nourished. No distress.  Well-appearing, comfortable, cooperative, obese  HENT:  Head: Normocephalic and atraumatic.  Mouth/Throat: Oropharynx is clear and moist.  Eyes: Conjunctivae are normal. Right eye exhibits  no discharge. Left eye exhibits no discharge.  Neck: Normal range of motion. Neck supple. No thyromegaly present.  Cardiovascular: Normal rate, regular rhythm, normal heart sounds and intact distal pulses.  No murmur heard. Pulmonary/Chest: Effort normal and breath sounds normal. No respiratory distress. She has no wheezes. She has no rales.  Musculoskeletal: Normal range of motion. She exhibits no edema.  Lymphadenopathy:    She has no cervical adenopathy.  Neurological: She is alert and oriented to person, place, and  time.  Skin: Skin is warm and dry. No rash noted. She is not diaphoretic. No erythema.  Psychiatric: She has a normal mood and affect. Her behavior is normal.  Well groomed, good eye contact, normal speech and thoughts  Nursing note and vitals reviewed.   Diabetic Foot Exam - Simple   Simple Foot Form Diabetic Foot exam was performed with the following findings:  Yes 12/02/2017  9:30 AM  Visual Inspection See comments:  Yes Sensation Testing See comments:  Yes Pulse Check Posterior Tibialis and Dorsalis pulse intact bilaterally:  Yes Comments Bilateral feet with 1st and 2nd toenails show some thickening narrowing without erythema or swelling or pain. Mild early callus formation heels. Reduced sensation to monofilament great toes bilateral and lateral forefoot. Middle toes digits 3-4 bilateral have significantly reduced sensation to monofilament testing.     Results for orders placed or performed in visit on 12/02/17  POCT HgB A1C  Result Value Ref Range   Hemoglobin A1C 7.1 (A) 5.6      Assessment & Plan:   Problem List Items Addressed This Visit    Controlled type 2 diabetes mellitus with neuropathy (HCC) - Primary    Improved DM control A1c 7.1 today, from prior 7.3 to 7.6 - Complication with peripheral neuropathy, rare episode of hypoglycemia  Plan: 1. Discussion on med adjustment options for future DM control / reduce risk of hypoglycemia and promote weight loss - START GLP1 - chose Ozempic - 0.25mg  [place] inj weekly for 4 weeks then inc to 0.5mg  weekly inj - already has pen needles, will rx if need, counsel on side effects benefits, copay card given - DISCONTINUE Glimepiride (Amaryl) once starts GLP1 - Continue Metformin 1000mg  BID for now - consider switch to XR in future - Continue Toujeo basal insulin for now - wait until on GLP1 for >2-4 weeks then can work on titrating down dose if improved sugar, 20-25u AM / 30-40u PM - can reduce by 2 units every 3 days if fasting CBG in  AM is < 150 2. Encouraged work on improving DM diet, exercise 3. Continue ASA, statin, ACEi - Need copy of last DM Eye Exam report from [facility] - requested record, reportedly normal - UTD DM Foot exam today, concern with neuropathy - defer start med like Gabapentin for now, try to improve sugar control - offered Podiatry referral for toenail eval 4. Follow-up 3 months for DM A1c      Relevant Medications   Semaglutide (OZEMPIC) 0.25 or 0.5 MG/DOSE SOPN   Other Relevant Orders   POCT HgB A1C (Completed)   Hyperlipidemia associated with type 2 diabetes mellitus (HCC)    Co morbid/complication of obesity and diabetes Previously fairly well controlled on Atorvastatin 10mg  daily Advised lifestyle improvement diet, exercise Continue Statin Not due yet for repeat lipids, plan to re-check in 05/2018      Relevant Medications   Semaglutide (OZEMPIC) 0.25 or 0.5 MG/DOSE SOPN   Obesity (BMI 30.0-34.9)    Weight gain  in past 6 months, +12-13 lbs Attributed to not ideal lifestyle diet and limited exercise Goal to improve DM diet and regular exercise Has controlled A1c New start GLP1 to help promote weight loss as well, goal to reduce insulin in future        Other Visit Diagnoses    Screening for breast cancer       Overdue for routine screening mammogram, order signed, [facility]   Relevant Orders   MM DIGITAL SCREENING BILATERAL      Meds ordered this encounter  Medications  . Semaglutide (OZEMPIC) 0.25 or 0.5 MG/DOSE SOPN    Sig: Inject 0.5 mg into the skin once a week. For first 4 weeks, inject dose 0.25mg  weekly    Dispense:  1 pen    Refill:  2    Follow up plan: Return in about 3 months (around 03/03/2018) for Diabetes A1c, med adjust.  Saralyn PilarAlexander Edgard Debord, DO [facility] 12/02/2017, 12:25 PM

## 2017-12-02 NOTE — Assessment & Plan Note (Signed)
Improved DM control A1c 7.1 today, from prior 7.3 to 7.6 - Complication with peripheral neuropathy, rare episode of hypoglycemia  Plan: 1. Discussion on med adjustment options for future DM control / reduce risk of hypoglycemia and promote weight loss - START GLP1 - chose Ozempic - 0.25mg  [place] inj weekly for 4 weeks then inc to 0.5mg  weekly inj - already has pen needles, will rx if need, counsel on side effects benefits, copay card given - DISCONTINUE Glimepiride (Amaryl) once starts GLP1 - Continue Metformin 1000mg  BID for now - consider switch to XR in future - Continue Toujeo basal insulin for now - wait until on GLP1 for >2-4 weeks then can work on titrating down dose if improved sugar, 20-25u AM / 30-40u PM - can reduce by 2 units every 3 days if fasting CBG in AM is < 150 2. Encouraged work on improving DM diet, exercise 3. Continue ASA, statin, ACEi - Need copy of last DM Eye Exam report from [facility] - requested record, reportedly normal - UTD DM Foot exam today, concern with neuropathy - defer start med like Gabapentin for now, try to improve sugar control - offered Podiatry referral for toenail eval 4. Follow-up 3 months for DM A1c

## 2017-12-02 NOTE — Assessment & Plan Note (Signed)
Weight gain in past 6 months, +12-13 lbs Attributed to not ideal lifestyle diet and limited exercise Goal to improve DM diet and regular exercise Has controlled A1c New start GLP1 to help promote weight loss as well, goal to reduce insulin in future

## 2017-12-02 NOTE — Patient Instructions (Addendum)
Thank you for coming to the office today.  A1c - 7.1  STOP taking Glimepiride once you start new injectable medicine  Take co-pay card to pharmacy  [facility]  1. Ozempic (Semaglutide injection) - start 0.25mg  weekly for 4 weeks then increase to 0.5mg  weekly - This one has best benefit of weight loss and reducing Cardiovascular events  Continue Metformin 1000mg  BID  For now continue Toujeo insulin as you were before - goal to reduce Toujeo, after first 4 weeks once on higher dose, can reduce fasting sugar in AM - if CBG < 150 you can reduce this dose small amount on each dose every 3 days, reduce by about 2 units per time.  For Mammogram screening for breast cancer   Call the Imaging Center below anytime to schedule your own appointment now that order has been placed.  [facility] [address] [place], [place] [address] Phone: [phone]  ------------------------------------------- If this is not covered or does not work   2. Bydureon BCise (Exenatide ER) - once weekly - this is my preference, very good medicine well tolerated, less side effects of nausea, upset stomach. No dose changes. Cost and coverage is the problem, but we may be able to get it with the coupon card  3. Trulicity (Dulaglutide) - once weekly - this is very good one, usually one of my top choices as well, two doses, 0.75 (likely we would start) and 1.5 max dose. We can use coupon card here too  4. Victoza (Liraglutide) - once DAILY - 3 dose changes 0.6, 1.2 and 1.8, side effects nausea, upset stomach higher on this one but it is still very effective medicine  Recommend to start taking Tylenol Extra Strength 500mg  tabs - take 1 to 2 tabs per dose (max 1000mg ) every 6-8 hours for pain (take regularly, don't skip a dose for next 7 days), max 24 hour daily dose is 6 tablets or 3000mg . In the future you can repeat the same everyday Tylenol  course for 1-2 weeks at a time.    Please schedule a Follow-up Appointment to: Return in about 3 months (around 03/03/2018) for Diabetes A1c, med adjust.  If you have any other questions or concerns, please feel free to call the office or send a message through MyChart. You may also schedule an earlier appointment if necessary.  Additionally, you may be receiving a survey about your experience at our office within a few days to 1 week by e-mail or mail. We value your feedback.  Saralyn PilarAlexander Karamalegos, DO [facility], [facility]     Piriformis Syndrome Rehabilitation Exercises   You may do all of these exercises right away once acute pain starts to improve on medication or treatment.  Piriformis stretch: Lying on your back with both knees bent, rest the ankle of your injured leg over the knee of your uninjured leg. Grasp the thigh of your uninjured leg and pull that knee toward your chest. You will feel a stretch along the buttocks and possibly along the outside of your hip on the injured side. Hold this for 15 to 30 seconds. Repeat 3 times.  Standing hamstring stretch: Place the heel of your leg on a stool about 15 inches high. Keep your knee straight. Lean forward, bending at the hips until you feel a mild stretch in the back of your thigh. Make sure you do not roll your shoulders and bend at the waist  when doing this or you will stretch your lower back instead. Hold the stretch for 15 to 30 seconds. Repeat 3 times.  Pelvic tilt: Lie on your back with your knees bent and your feet flat on the floor. Tighten your abdominal muscles and push your lower back into the floor. Hold this position for 5 seconds, then relax. Do 3 sets of 10.  Partial curl: Lie on your back with your knees bent and your feet flat on the floor. Tighten your stomach muscles and flatten your back against the floor. Tuck your chin to your chest. With your hands stretched out in front of you, curl your upper body  forward until your shoulders clear the floor. Hold this position for 3 seconds. Don't hold your breath. It helps to breathe out as you lift your shoulders up. Relax. Repeat 10 times. Build to 3 sets of 10. To challenge yourself, clasp your hands behind your head and keep your elbows out to the side.  Prone hip extension: Lie on your stomach with your legs straight out behind you. Tighten up your buttocks muscles and lift one leg off the floor about 8 inches. Keep your knee straight. Hold for 5 seconds. Then lower your leg and relax. Do 3 sets of 10.  If both legs are affected, you may repeat this exercise for the other leg.

## 2017-12-02 NOTE — Telephone Encounter (Signed)
Seen today in office visit for diabetes, she was due for office visit and due for refills, had temporary rx on some, needed apt for refills, all sent to pharmacy today, she has already called requesting them.  Saralyn PilarAlexander Karamalegos, DO [facility] 12/02/2017, 1:34 PM

## 2017-12-21 ENCOUNTER — Telehealth: Payer: Self-pay | Admitting: Gastroenterology

## 2017-12-21 NOTE — Telephone Encounter (Signed)
LVM to call our office for a recall appt with Dr. Allegra Lai.

## 2018-01-04 ENCOUNTER — Other Ambulatory Visit: Payer: Self-pay | Admitting: Family Medicine

## 2018-01-04 DIAGNOSIS — E1140 Type 2 diabetes mellitus with diabetic neuropathy, unspecified: Secondary | ICD-10-CM

## 2018-02-08 ENCOUNTER — Other Ambulatory Visit: Payer: Self-pay | Admitting: Family Medicine

## 2018-02-08 DIAGNOSIS — E1140 Type 2 diabetes mellitus with diabetic neuropathy, unspecified: Secondary | ICD-10-CM

## 2018-02-08 MED ORDER — SEMAGLUTIDE(0.25 OR 0.5MG/DOS) 2 MG/1.5ML ~~LOC~~ SOPN: 0.5000 mg | PEN_INJECTOR | SUBCUTANEOUS | 2 refills | Status: DC

## 2018-02-08 NOTE — Telephone Encounter (Signed)
Pt is out of ozempic due to increasing the dosage per Dr. Kirtland BouchardK.  Her call back number is [phone]

## 2018-02-08 NOTE — Telephone Encounter (Signed)
She should schedule office visit for after 03/03/18 for Diabetes follow-up to discuss Ozempic further if concerns or questions.  Refilled Ozempic 0.5mg  weekly should be 4 doses per pen with 2 refills  Saralyn PilarAlexander Bernon Arviso, DO [facility] 03/09/202019, 1:20 PM

## 2018-02-10 ENCOUNTER — Telehealth: Payer: Self-pay

## 2018-02-10 ENCOUNTER — Other Ambulatory Visit: Payer: Self-pay

## 2018-02-10 DIAGNOSIS — E1140 Type 2 diabetes mellitus with diabetic neuropathy, unspecified: Secondary | ICD-10-CM

## 2018-02-10 MED ORDER — SEMAGLUTIDE(0.25 OR 0.5MG/DOS) 2 MG/1.5ML ~~LOC~~ SOPN: 0.5000 mg | PEN_INJECTOR | SUBCUTANEOUS | 0 refills | Status: DC

## 2018-02-10 NOTE — Telephone Encounter (Signed)
Error

## 2018-02-11 ENCOUNTER — Other Ambulatory Visit: Payer: Self-pay | Admitting: Family Medicine

## 2018-04-25 ENCOUNTER — Other Ambulatory Visit: Payer: Self-pay | Admitting: Family Medicine

## 2018-04-25 DIAGNOSIS — E1140 Type 2 diabetes mellitus with diabetic neuropathy, unspecified: Secondary | ICD-10-CM

## 2018-04-28 ENCOUNTER — Ambulatory Visit: Payer: BC Managed Care – PPO | Admitting: Family Medicine

## 2018-04-28 ENCOUNTER — Encounter: Payer: Self-pay | Admitting: Family Medicine

## 2018-04-28 VITALS — BP 128/86 | Temp 99.5°F | Resp 16 | Ht 66.0 in | Wt 193.6 lb

## 2018-04-28 DIAGNOSIS — J3089 Other allergic rhinitis: Secondary | ICD-10-CM | POA: Diagnosis not present

## 2018-04-28 DIAGNOSIS — E1140 Type 2 diabetes mellitus with diabetic neuropathy, unspecified: Secondary | ICD-10-CM | POA: Diagnosis not present

## 2018-04-28 LAB — POCT GLYCOSYLATED HEMOGLOBIN (HGB A1C): Hemoglobin A1C: 6.7 % — AB (ref 4.0–5.6)

## 2018-04-28 MED ORDER — SEMAGLUTIDE (1 MG/DOSE) 2 MG/1.5ML ~~LOC~~ SOPN: 1.0000 mg | PEN_INJECTOR | SUBCUTANEOUS | 11 refills | Status: AC

## 2018-04-28 MED ORDER — LORATADINE 10 MG PO TABS: 10.0000 mg | ORAL_TABLET | Freq: Every day | ORAL | 3 refills | Status: AC

## 2018-04-28 NOTE — Progress Notes (Signed)
Subjective:    Patient ID: Patricia [name], female    DOB: [birth year], 52 y.o.   MRN: [id number]  Patricia [name] is a 52 y.o. female presenting on 04/28/2018 for Diabetes   HPI   CHRONIC DM, Type 2, with NEUROPATHY / HYPERLIPIDEMIA OBESITY BMI >34 Last visit 11/2017 for Diabetes, she was started on new GLP1 Ozempic titration, she did not follow-up as expected within 3 months for dose adjust and refills, instead pharmacy requested refill and she began increased dose over past 1-2 months with 0.5mg  x 2 injections weekly, ran out earlier, then med not covered due to early refill. She was able to reduce Toujeo insulin down from BID dosing 20-40 per dose, now down to 20u DAILY IN AM ONLY, tried variety of doses, cannot get down to 15u daily due to fasting sugar elevations - Overall interval improved weight down 23 lbs in 5 months CBGs Improved CBG reading Meds: Ozempic 0.5mg  x2 weekly, Toujeo 20u AM daily, Metformin 1000mg  BID - OFF Glimepiride Reports good compliance. Tolerating well w/o side-effects Currently on ACEi, on statin Lifestyle: -Diet (IMproved DM diet) -Exercise (more regular exercise - Takes ASA, Statin - Admits some neuropathy in feet has tingling intermittent worse - Last DM Eye Exam done at [facility] approx in March 2019, requested fax copy of report Denies hypoglycemia, polyuria, visual changes, numbness or tingling.   Health Maintenance: Overdue for annual physical, she will return as requested 07/2018 for this. Due for Flu Shot - declines despite counseling  Depression screen [facility] 2/9 04/28/2018 12/02/2017 09/05/2015  Decreased Interest 0 0 0  Down, Depressed, Hopeless 0 0 0  PHQ - 2 Score 0 0 0    Social History   Tobacco Use  . Smoking status: Never Smoker  . Smokeless tobacco: Never Used  Substance Use Topics  . Alcohol use: No    Comment: rare  . Drug use: No    Review of Systems Per HPI unless specifically indicated above     Objective:      BP 128/86   Temp 99.5 F (37.5 C) (Oral)   Resp 16   Ht 5\' 6"  (1.676 m)   Wt 193 lb 9.6 oz (87.8 kg)   LMP 07/10/2015 (Approximate)   BMI 31.25 kg/m   Wt Readings from Last 3 Encounters:  04/28/18 193 lb 9.6 oz (87.8 kg)  12/02/17 216 lb (98 kg)  06/24/17 203 lb (92.1 kg)    Physical Exam  Constitutional: She is oriented to person, place, and time. She appears well-developed and well-nourished. No distress.  Well-appearing, comfortable, cooperative, obese with some weight loss now  HENT:  Head: Normocephalic and atraumatic.  Mouth/Throat: Oropharynx is clear and moist.  Eyes: Conjunctivae are normal. Right eye exhibits no discharge. Left eye exhibits no discharge.  Neck: Normal range of motion. Neck supple. No thyromegaly present.  Cardiovascular: Normal rate, regular rhythm, normal heart sounds and intact distal pulses.  No murmur heard. Pulmonary/Chest: Effort normal and breath sounds normal. No respiratory distress. She has no wheezes. She has no rales.  Musculoskeletal: Normal range of motion. She exhibits no edema.  Lymphadenopathy:    She has no cervical adenopathy.  Neurological: She is alert and oriented to person, place, and time.  Skin: Skin is warm and dry. No rash noted. She is not diaphoretic. No erythema.  Psychiatric: She has a normal mood and affect. Her behavior is normal.  Well groomed, good eye contact, normal speech and thoughts  Nursing  note and vitals reviewed.    Recent Labs    05/12/17 1519 12/02/17 0911 04/28/18 1156  HGBA1C 7.3* 7.1* 6.7*    Results for orders placed or performed in visit on 04/28/18  POCT HgB A1C  Result Value Ref Range   Hemoglobin A1C 6.7 (A) 4.0 - 5.6 %      Assessment & Plan:   Problem List Items Addressed This Visit    Controlled type 2 diabetes mellitus with neuropathy (HCC) - Primary    Well-controlled DM with A1c 6.7, overall remains improved from 7.1-7.3 Complications - peripheral neuropathy, other including  hyperlipidemia, GERD, depression, obesity - increases risk of future cardiovascular complications   Plan:  1. New rx increase dose Ozempic from 0.5mg  up to 1mg  weekly injection (she was doubling 0.5 already by her report) - Continue Metformin 1000mg  BID, Toujeo 20u daily in AM 2. Encourage improved lifestyle - low carb, low sugar diet, reduce portion size, continue improving regular exercise 3. Check CBG, bring log to next visit for review 4. Continue ASA, ACEi , Statin 5. Will request record from [facility] again 10/2017 6. Follow-up 3 months       Relevant Medications   Semaglutide (OZEMPIC) 1 MG/DOSE SOPN   Other Relevant Orders   POCT HgB A1C (Completed)    Other Visit Diagnoses    Environmental and seasonal allergies       Relevant Medications   loratadine (CLARITIN) 10 MG tablet      Meds ordered this encounter  Medications  . Semaglutide (OZEMPIC) 1 MG/DOSE SOPN    Sig: Inject 1 mg into the skin once a week.    Dispense:  2 pen    Refill:  11    Dose changed from 0.5 up to 1mg  - new pen sent, monthly rx for 2 pens per month, fill appropriate amount  . loratadine (CLARITIN) 10 MG tablet    Sig: Take 1 tablet (10 mg total) by mouth daily.    Dispense:  90 tablet    Refill:  3    Follow up plan: Return in about 3 months (around 07/28/2018) for Annual Physical.  Future labs ordered for 07/2018 approx  Saralyn Pilar, DO [facility] 04/28/2018, 6:31 PM

## 2018-04-28 NOTE — Patient Instructions (Addendum)
Thank you for coming to the office today.  A1c 6.7 - great job!  Sent new rx Ozempic 1mg  pens - monthly supply.  All other rx active with refills  For Mammogram screening for breast cancer   Call the Imaging Center below anytime to schedule your own appointment now that order has been placed.  [facility] [address] [place], [place] [address] Phone: [phone]  DUE for FASTING BLOOD WORK (no food or drink after midnight before the lab appointment, only water or coffee without cream/sugar on the morning of)  SCHEDULE "Lab Only" visit in the morning at the clinic for lab draw in 3 MONTHS   - Make sure Lab Only appointment is at about 1 week before your next appointment, so that results will be available  For Lab Results, once available within 2-3 days of blood draw, you can can log in to MyChart online to view your results and a brief explanation. Also, we can discuss results at next follow-up visit.    Please schedule a Follow-up Appointment to: Return in about 3 months (around 07/28/2018) for Annual Physical.  If you have any other questions or concerns, please feel free to call the office or send a message through MyChart. You may also schedule an earlier appointment if necessary.  Additionally, you may be receiving a survey about your experience at our office within a few days to 1 week by e-mail or mail. We value your feedback.  Saralyn Pilar, DO [facility], [place]

## 2018-04-29 ENCOUNTER — Other Ambulatory Visit: Payer: Self-pay | Admitting: Family Medicine

## 2018-04-29 ENCOUNTER — Encounter: Payer: Self-pay | Admitting: Family Medicine

## 2018-04-29 DIAGNOSIS — D500 Iron deficiency anemia secondary to blood loss (chronic): Secondary | ICD-10-CM

## 2018-04-29 DIAGNOSIS — E1169 Type 2 diabetes mellitus with other specified complication: Secondary | ICD-10-CM

## 2018-04-29 DIAGNOSIS — F419 Anxiety disorder, unspecified: Secondary | ICD-10-CM

## 2018-04-29 DIAGNOSIS — Z0000 Encounter for general adult medical examination without abnormal findings: Secondary | ICD-10-CM

## 2018-04-29 DIAGNOSIS — E785 Hyperlipidemia, unspecified: Secondary | ICD-10-CM

## 2018-04-29 DIAGNOSIS — M1711 Unilateral primary osteoarthritis, right knee: Secondary | ICD-10-CM

## 2018-04-29 DIAGNOSIS — E1140 Type 2 diabetes mellitus with diabetic neuropathy, unspecified: Secondary | ICD-10-CM

## 2018-04-29 DIAGNOSIS — I10 Essential (primary) hypertension: Secondary | ICD-10-CM

## 2018-04-29 DIAGNOSIS — G4700 Insomnia, unspecified: Secondary | ICD-10-CM

## 2018-04-29 DIAGNOSIS — K219 Gastro-esophageal reflux disease without esophagitis: Secondary | ICD-10-CM

## 2018-04-29 NOTE — Assessment & Plan Note (Signed)
Well-controlled DM with A1c 6.7, overall remains improved from 7.1-7.3 Complications - peripheral neuropathy, other including hyperlipidemia, GERD, depression, obesity - increases risk of future cardiovascular complications   Plan:  1. New rx increase dose Ozempic from 0.5mg  up to 1mg  weekly injection (she was doubling 0.5 already by her report) - Continue Metformin 1000mg  BID, Toujeo 20u daily in AM 2. Encourage improved lifestyle - low carb, low sugar diet, reduce portion size, continue improving regular exercise 3. Check CBG, bring log to next visit for review 4. Continue ASA, ACEi , Statin 5. Will request record from [facility] again 10/2017 6. Follow-up 3 months

## 2018-06-03 ENCOUNTER — Other Ambulatory Visit: Payer: Self-pay

## 2018-06-03 DIAGNOSIS — E1142 Type 2 diabetes mellitus with diabetic polyneuropathy: Secondary | ICD-10-CM

## 2018-06-03 DIAGNOSIS — Z794 Long term (current) use of insulin: Principal | ICD-10-CM

## 2018-06-03 MED ORDER — INSULIN GLARGINE 300 UNIT/ML ~~LOC~~ SOPN: 20.0000 [IU] | PEN_INJECTOR | Freq: Every day | SUBCUTANEOUS | 1 refills | Status: DC

## 2018-06-03 MED ORDER — BASAGLAR KWIKPEN 100 UNIT/ML ~~LOC~~ SOPN: 20.0000 [IU] | PEN_INJECTOR | Freq: Every day | SUBCUTANEOUS | 1 refills | Status: DC

## 2018-06-03 NOTE — Addendum Note (Signed)
Addended by: Smitty Cords on: 06/03/2018 03:03 PM   Modules accepted: Orders

## 2018-08-31 ENCOUNTER — Other Ambulatory Visit
Admission: RE | Admit: 2018-08-31 | Discharge: 2018-08-31 | Disposition: A | Discharge status: Home / Self Care | Payer: BC Managed Care – PPO | Source: Ambulatory Visit | Attending: Internal Medicine | Admitting: Internal Medicine

## 2018-10-25 ENCOUNTER — Emergency Department: Payer: BC Managed Care – PPO

## 2018-10-25 ENCOUNTER — Inpatient Hospital Stay
Admission: EM | Admit: 2018-10-25 | Discharge: 2018-10-28 | DRG: 291 | Disposition: A | Discharge status: Home / Self Care | Payer: BC Managed Care – PPO | Attending: Internal Medicine | Admitting: Internal Medicine

## 2018-10-25 ENCOUNTER — Other Ambulatory Visit: Payer: Self-pay

## 2018-10-25 ENCOUNTER — Inpatient Hospital Stay
Admit: 2018-10-25 | Discharge: 2018-10-25 | Disposition: A | Discharge status: Home / Self Care | Payer: BC Managed Care – PPO | Attending: Internal Medicine | Admitting: Internal Medicine

## 2018-10-25 ENCOUNTER — Encounter: Payer: Self-pay | Admitting: Emergency Medicine

## 2018-10-25 ENCOUNTER — Inpatient Hospital Stay: Admit: 2018-10-25 | Payer: BC Managed Care – PPO

## 2018-10-25 DIAGNOSIS — Z833 Family history of diabetes mellitus: Secondary | ICD-10-CM

## 2018-10-25 DIAGNOSIS — Z9049 Acquired absence of other specified parts of digestive tract: Secondary | ICD-10-CM | POA: Diagnosis not present

## 2018-10-25 DIAGNOSIS — Z791 Long term (current) use of non-steroidal anti-inflammatories (NSAID): Secondary | ICD-10-CM

## 2018-10-25 DIAGNOSIS — Z96651 Presence of right artificial knee joint: Secondary | ICD-10-CM | POA: Diagnosis present

## 2018-10-25 DIAGNOSIS — K219 Gastro-esophageal reflux disease without esophagitis: Secondary | ICD-10-CM | POA: Diagnosis present

## 2018-10-25 DIAGNOSIS — M1990 Unspecified osteoarthritis, unspecified site: Secondary | ICD-10-CM | POA: Diagnosis present

## 2018-10-25 DIAGNOSIS — Z7982 Long term (current) use of aspirin: Secondary | ICD-10-CM

## 2018-10-25 DIAGNOSIS — Z8249 Family history of ischemic heart disease and other diseases of the circulatory system: Secondary | ICD-10-CM | POA: Diagnosis not present

## 2018-10-25 DIAGNOSIS — Z7984 Long term (current) use of oral hypoglycemic drugs: Secondary | ICD-10-CM

## 2018-10-25 DIAGNOSIS — J9601 Acute respiratory failure with hypoxia: Secondary | ICD-10-CM

## 2018-10-25 DIAGNOSIS — I110 Hypertensive heart disease with heart failure: Principal | ICD-10-CM | POA: Diagnosis present

## 2018-10-25 DIAGNOSIS — Z79899 Other long term (current) drug therapy: Secondary | ICD-10-CM

## 2018-10-25 DIAGNOSIS — I5023 Acute on chronic systolic (congestive) heart failure: Secondary | ICD-10-CM | POA: Diagnosis present

## 2018-10-25 DIAGNOSIS — E669 Obesity, unspecified: Secondary | ICD-10-CM | POA: Diagnosis present

## 2018-10-25 DIAGNOSIS — I5020 Unspecified systolic (congestive) heart failure: Secondary | ICD-10-CM

## 2018-10-25 DIAGNOSIS — I509 Heart failure, unspecified: Secondary | ICD-10-CM

## 2018-10-25 DIAGNOSIS — J189 Pneumonia, unspecified organism: Secondary | ICD-10-CM | POA: Diagnosis present

## 2018-10-25 DIAGNOSIS — D721 Eosinophilia: Secondary | ICD-10-CM | POA: Diagnosis present

## 2018-10-25 DIAGNOSIS — F329 Major depressive disorder, single episode, unspecified: Secondary | ICD-10-CM | POA: Diagnosis present

## 2018-10-25 DIAGNOSIS — Z6831 Body mass index (BMI) 31.0-31.9, adult: Secondary | ICD-10-CM | POA: Diagnosis not present

## 2018-10-25 DIAGNOSIS — E785 Hyperlipidemia, unspecified: Secondary | ICD-10-CM | POA: Diagnosis present

## 2018-10-25 DIAGNOSIS — J440 Chronic obstructive pulmonary disease with acute lower respiratory infection: Secondary | ICD-10-CM | POA: Diagnosis present

## 2018-10-25 DIAGNOSIS — E119 Type 2 diabetes mellitus without complications: Secondary | ICD-10-CM | POA: Diagnosis present

## 2018-10-25 DIAGNOSIS — D649 Anemia, unspecified: Secondary | ICD-10-CM | POA: Diagnosis present

## 2018-10-25 DIAGNOSIS — Z8349 Family history of other endocrine, nutritional and metabolic diseases: Secondary | ICD-10-CM

## 2018-10-25 DIAGNOSIS — I429 Cardiomyopathy, unspecified: Secondary | ICD-10-CM | POA: Diagnosis present

## 2018-10-25 DIAGNOSIS — J441 Chronic obstructive pulmonary disease with (acute) exacerbation: Secondary | ICD-10-CM | POA: Diagnosis present

## 2018-10-25 LAB — CBC WITH DIFFERENTIAL/PLATELET
ABS IMMATURE GRANULOCYTES: 0.04 10*3/uL (ref 0.00–0.07)
Basophils Absolute: 0.1 10*3/uL (ref 0.0–0.1)
Basophils Relative: 1 %
Eosinophils Absolute: 0.4 10*3/uL (ref 0.0–0.5)
Eosinophils Relative: 5 %
HCT: 37.2 % (ref 36.0–46.0)
Hemoglobin: 12 g/dL (ref 12.0–15.0)
Immature Granulocytes: 0 %
Lymphocytes Relative: 23 %
Lymphs Abs: 2.1 10*3/uL (ref 0.7–4.0)
MCH: 28.4 pg (ref 26.0–34.0)
MCHC: 32.3 g/dL (ref 30.0–36.0)
MCV: 88.2 fL (ref 80.0–100.0)
MONO ABS: 0.4 10*3/uL (ref 0.1–1.0)
Monocytes Relative: 5 %
NEUTROS ABS: 6 10*3/uL (ref 1.7–7.7)
Neutrophils Relative %: 66 %
Platelets: 387 10*3/uL (ref 150–400)
RBC: 4.22 MIL/uL (ref 3.87–5.11)
RDW: 13.7 % (ref 11.5–15.5)
WBC: 9.1 10*3/uL (ref 4.0–10.5)
nRBC: 0 % (ref 0.0–0.2)

## 2018-10-25 LAB — ECHOCARDIOGRAM COMPLETE
HEIGHTINCHES: 66 in
Weight: 3136 oz

## 2018-10-25 LAB — BASIC METABOLIC PANEL
Anion gap: 8 (ref 5–15)
BUN: 19 mg/dL (ref 6–20)
CO2: 24 mmol/L (ref 22–32)
Calcium: 9.2 mg/dL (ref 8.9–10.3)
Chloride: 108 mmol/L (ref 98–111)
Creatinine, Ser: 1 mg/dL (ref 0.44–1.00)
GFR calc Af Amer: >60 mL/min (ref >60)
GFR calc non Af Amer: >60 mL/min (ref >60)
Glucose, Bld: 140 mg/dL — ABNORMAL HIGH (ref 70–99)
Potassium: 4.6 mmol/L (ref 3.5–5.1)
Sodium: 140 mmol/L (ref 135–145)

## 2018-10-25 LAB — FIBRIN DERIVATIVES D-DIMER (ARMC ONLY): Fibrin derivatives D-dimer (ARMC): 1376.81 ng/mL (FEU) — ABNORMAL HIGH (ref 0.00–499.00)

## 2018-10-25 LAB — HEMOGLOBIN A1C
Hgb A1c MFr Bld: 6.6 % — ABNORMAL HIGH (ref 4.8–5.6)
Mean Plasma Glucose: 142.72 mg/dL

## 2018-10-25 LAB — INFLUENZA PANEL BY PCR (TYPE A & B)
Influenza A By PCR: NEGATIVE
Influenza B By PCR: NEGATIVE

## 2018-10-25 LAB — PROCALCITONIN: Procalcitonin: <0.1 ng/mL

## 2018-10-25 LAB — GLUCOSE, CAPILLARY
GLUCOSE-CAPILLARY: 266 mg/dL — AB (ref 70–99)
Glucose-Capillary: 229 mg/dL — ABNORMAL HIGH (ref 70–99)

## 2018-10-25 LAB — BRAIN NATRIURETIC PEPTIDE: B Natriuretic Peptide: 766 pg/mL — ABNORMAL HIGH (ref 0.0–100.0)

## 2018-10-25 LAB — TROPONIN I: Troponin I: <0.03 ng/mL (ref <0.03)

## 2018-10-25 MED ORDER — LORATADINE 10 MG PO TABS
10.0000 mg | ORAL_TABLET | Freq: Every day | ORAL | Status: DC
  Administered 2018-10-26 – 2018-10-28 (×3): 10 mg via ORAL
  Filled 2018-10-25 (×3): qty 1

## 2018-10-25 MED ORDER — IOHEXOL 350 MG/ML SOLN
75.0000 mL | Freq: Once | INTRAVENOUS | Status: AC | PRN
  Administered 2018-10-25: 75 mL via INTRAVENOUS

## 2018-10-25 MED ORDER — IPRATROPIUM-ALBUTEROL 0.5-2.5 (3) MG/3ML IN SOLN
3.0000 mL | Freq: Four times a day (QID) | RESPIRATORY_TRACT | Status: DC
  Administered 2018-10-25 – 2018-10-28 (×11): 3 mL via RESPIRATORY_TRACT
  Filled 2018-10-25 (×11): qty 3

## 2018-10-25 MED ORDER — SACUBITRIL-VALSARTAN 24-26 MG PO TABS
1.0000 | ORAL_TABLET | Freq: Two times a day (BID) | ORAL | Status: DC
  Administered 2018-10-25 – 2018-10-28 (×6): 1 via ORAL
  Filled 2018-10-25 (×6): qty 1

## 2018-10-25 MED ORDER — INSULIN ASPART 100 UNIT/ML ~~LOC~~ SOLN
0.0000 [IU] | Freq: Every day | SUBCUTANEOUS | Status: DC
  Administered 2018-10-25 – 2018-10-27 (×2): 2 [IU] via SUBCUTANEOUS
  Filled 2018-10-25 (×2): qty 1

## 2018-10-25 MED ORDER — METOCLOPRAMIDE HCL 5 MG/ML IJ SOLN
10.0000 mg | Freq: Once | INTRAMUSCULAR | Status: AC
  Administered 2018-10-25: 10 mg via INTRAVENOUS
  Filled 2018-10-25: qty 2

## 2018-10-25 MED ORDER — BASAGLAR KWIKPEN 100 UNIT/ML ~~LOC~~ SOPN: 20.0000 [IU] | PEN_INJECTOR | Freq: Every day | SUBCUTANEOUS | Status: DC

## 2018-10-25 MED ORDER — IPRATROPIUM-ALBUTEROL 0.5-2.5 (3) MG/3ML IN SOLN
3.0000 mL | Freq: Once | RESPIRATORY_TRACT | Status: AC
  Administered 2018-10-25: 3 mL via RESPIRATORY_TRACT
  Filled 2018-10-25: qty 3

## 2018-10-25 MED ORDER — IPRATROPIUM-ALBUTEROL 0.5-2.5 (3) MG/3ML IN SOLN
RESPIRATORY_TRACT | Status: AC
  Filled 2018-10-25: qty 6

## 2018-10-25 MED ORDER — METOPROLOL TARTRATE 25 MG PO TABS
25.0000 mg | ORAL_TABLET | Freq: Two times a day (BID) | ORAL | Status: DC
  Administered 2018-10-25 – 2018-10-26 (×4): 25 mg via ORAL
  Filled 2018-10-25 (×5): qty 1

## 2018-10-25 MED ORDER — INSULIN GLARGINE 100 UNIT/ML ~~LOC~~ SOLN
20.0000 [IU] | Freq: Every day | SUBCUTANEOUS | Status: DC
  Administered 2018-10-25 – 2018-10-27 (×3): 20 [IU] via SUBCUTANEOUS
  Filled 2018-10-25 (×4): qty 0.2

## 2018-10-25 MED ORDER — FERROUS SULFATE 325 (65 FE) MG PO TABS
325.0000 mg | ORAL_TABLET | Freq: Every day | ORAL | Status: DC
  Administered 2018-10-26 – 2018-10-28 (×3): 325 mg via ORAL
  Filled 2018-10-25 (×3): qty 1

## 2018-10-25 MED ORDER — ASPIRIN EC 81 MG PO TBEC
81.0000 mg | DELAYED_RELEASE_TABLET | Freq: Every day | ORAL | Status: DC
  Administered 2018-10-26 – 2018-10-28 (×3): 81 mg via ORAL
  Filled 2018-10-25 (×3): qty 1

## 2018-10-25 MED ORDER — ONDANSETRON HCL 4 MG PO TABS: 4.0000 mg | ORAL_TABLET | Freq: Four times a day (QID) | ORAL | Status: DC | PRN

## 2018-10-25 MED ORDER — ENOXAPARIN SODIUM 40 MG/0.4ML ~~LOC~~ SOLN
40.0000 mg | SUBCUTANEOUS | Status: DC
  Administered 2018-10-25 – 2018-10-27 (×3): 40 mg via SUBCUTANEOUS
  Filled 2018-10-25 (×3): qty 0.4

## 2018-10-25 MED ORDER — METHYLPREDNISOLONE SODIUM SUCC 125 MG IJ SOLR
125.0000 mg | Freq: Once | INTRAMUSCULAR | Status: AC
  Administered 2018-10-25: 125 mg via INTRAVENOUS
  Filled 2018-10-25: qty 2

## 2018-10-25 MED ORDER — KETOROLAC TROMETHAMINE 30 MG/ML IJ SOLN
15.0000 mg | Freq: Once | INTRAMUSCULAR | Status: AC
  Administered 2018-10-25: 15 mg via INTRAVENOUS
  Filled 2018-10-25: qty 1

## 2018-10-25 MED ORDER — ACETAMINOPHEN 325 MG PO TABS
650.0000 mg | ORAL_TABLET | Freq: Three times a day (TID) | ORAL | Status: DC | PRN
  Administered 2018-10-25 – 2018-10-26 (×2): 650 mg via ORAL
  Filled 2018-10-25 (×2): qty 2

## 2018-10-25 MED ORDER — BUDESONIDE 0.5 MG/2ML IN SUSP
0.5000 mg | Freq: Two times a day (BID) | RESPIRATORY_TRACT | Status: DC
  Administered 2018-10-25 – 2018-10-28 (×6): 0.5 mg via RESPIRATORY_TRACT
  Filled 2018-10-25 (×8): qty 2

## 2018-10-25 MED ORDER — PANTOPRAZOLE SODIUM 40 MG PO TBEC
40.0000 mg | DELAYED_RELEASE_TABLET | Freq: Every day | ORAL | Status: DC
  Administered 2018-10-25 – 2018-10-27 (×3): 40 mg via ORAL
  Filled 2018-10-25 (×3): qty 1

## 2018-10-25 MED ORDER — AZITHROMYCIN 250 MG PO TABS
250.0000 mg | ORAL_TABLET | Freq: Every day | ORAL | Status: DC
  Administered 2018-10-26: 250 mg via ORAL
  Filled 2018-10-25: qty 1

## 2018-10-25 MED ORDER — SODIUM CHLORIDE 0.9 % IV SOLN
2.0000 g | INTRAVENOUS | Status: DC
  Administered 2018-10-25 – 2018-10-26 (×2): 2 g via INTRAVENOUS
  Filled 2018-10-25: qty 2
  Filled 2018-10-25: qty 20

## 2018-10-25 MED ORDER — ONDANSETRON HCL 4 MG/2ML IJ SOLN: 4.0000 mg | Freq: Four times a day (QID) | INTRAMUSCULAR | Status: DC | PRN

## 2018-10-25 MED ORDER — AZITHROMYCIN 500 MG PO TABS
500.0000 mg | ORAL_TABLET | Freq: Every day | ORAL | Status: AC
  Administered 2018-10-25: 500 mg via ORAL
  Filled 2018-10-25: qty 1

## 2018-10-25 MED ORDER — FLUOXETINE HCL 20 MG PO CAPS
40.0000 mg | ORAL_CAPSULE | Freq: Every day | ORAL | Status: DC
  Administered 2018-10-26 – 2018-10-28 (×3): 40 mg via ORAL
  Filled 2018-10-25 (×3): qty 2

## 2018-10-25 MED ORDER — CARVEDILOL 3.125 MG PO TABS
3.1250 mg | ORAL_TABLET | Freq: Two times a day (BID) | ORAL | Status: DC
  Administered 2018-10-26 – 2018-10-27 (×4): 3.125 mg via ORAL
  Filled 2018-10-25 (×4): qty 1

## 2018-10-25 MED ORDER — FUROSEMIDE 10 MG/ML IJ SOLN
20.0000 mg | Freq: Two times a day (BID) | INTRAMUSCULAR | Status: AC
  Administered 2018-10-25 – 2018-10-27 (×5): 20 mg via INTRAVENOUS
  Filled 2018-10-25 (×4): qty 2

## 2018-10-25 MED ORDER — IPRATROPIUM-ALBUTEROL 0.5-2.5 (3) MG/3ML IN SOLN
3.0000 mL | Freq: Once | RESPIRATORY_TRACT | Status: AC
  Administered 2018-10-25: 3 mL via RESPIRATORY_TRACT

## 2018-10-25 MED ORDER — FUROSEMIDE 10 MG/ML IJ SOLN
20.0000 mg | Freq: Once | INTRAMUSCULAR | Status: AC
  Administered 2018-10-25: 20 mg via INTRAVENOUS
  Filled 2018-10-25: qty 4

## 2018-10-25 MED ORDER — LISINOPRIL 20 MG PO TABS: 20.0000 mg | ORAL_TABLET | Freq: Every day | ORAL | Status: DC

## 2018-10-25 MED ORDER — INSULIN ASPART 100 UNIT/ML ~~LOC~~ SOLN
0.0000 [IU] | Freq: Three times a day (TID) | SUBCUTANEOUS | Status: DC
  Administered 2018-10-25: 8 [IU] via SUBCUTANEOUS
  Administered 2018-10-26: 5 [IU] via SUBCUTANEOUS
  Administered 2018-10-27: 2 [IU] via SUBCUTANEOUS
  Administered 2018-10-27 (×2): 3 [IU] via SUBCUTANEOUS
  Administered 2018-10-28: 5 [IU] via SUBCUTANEOUS
  Filled 2018-10-25 (×6): qty 1

## 2018-10-25 MED ORDER — GUAIFENESIN 100 MG/5ML PO SOLN
5.0000 mL | ORAL | Status: DC | PRN
  Filled 2018-10-25: qty 5

## 2018-10-25 MED ORDER — CARVEDILOL 6.25 MG PO TABS: 3.1250 mg | ORAL_TABLET | Freq: Two times a day (BID) | ORAL | Status: DC

## 2018-10-25 MED ORDER — SODIUM CHLORIDE 0.9 % IV BOLUS
1000.0000 mL | Freq: Once | INTRAVENOUS | Status: AC
  Administered 2018-10-25: 1000 mL via INTRAVENOUS

## 2018-10-25 NOTE — ED Notes (Signed)
2d echo completed at bedside.

## 2018-10-25 NOTE — H&P (Signed)
[place] [facility] [place] NAME: Patricia [name]    MR#:  [id number]  DATE OF BIRTH:  1965-12-20  DATE OF ADMISSION:  10/25/2018  PRIMARY CARE PHYSICIAN: Olin Hauser, DO   REQUESTING/REFERRING PHYSICIAN: Dr Gonzella Lex  CHIEF COMPLAINT:   Chief Complaint  Patient presents with  . Headache  . Shortness of Breath    HISTORY OF PRESENT ILLNESS:  Patricia [name]  is a 53 y.o. female came in with headache and shortness of breath.  She states the headache is been going on for a few days.  She woke up with trouble breathing this morning.  Complains of a little ache in the chest with the cough.  She tried using her as needed inhaler which did not help.  She has been having a cough on and off for a few weeks.  Sometimes she coughs up white phlegm or clear phlegm.  She has been wheezing a little bit.  She has been having runny nose and a raspy voice.  She works at a school.  No travel history.  She has had a recent weight gain.  Hospitalist services were contacted for further evaluation.  PAST MEDICAL HISTORY:   Past Medical History:  Diagnosis Date  . Anemia   . Arthritis   . Complication of anesthesia 12/13/2015   aspiration after breathing tube removed after several surgeries  . Diabetes mellitus without complication ([place])   . Dyspnea   . GERD (gastroesophageal reflux disease)   . Hyperlipidemia   . Hypertension   . Infection 2012   right knee infection, required arthroscopy, long term antibiotic treatment, aspiration therapy    PAST SURGICAL HISTORY:   Past Surgical History:  Procedure Laterality Date  . CHOLECYSTECTOMY    . COLONOSCOPY WITH PROPOFOL N/A 06/24/2017   Procedure: COLONOSCOPY WITH PROPOFOL;  Surgeon: Lin Landsman, MD;  Location: [facility];  Service: Endoscopy;  Laterality: N/A;  . ESOPHAGOGASTRODUODENOSCOPY (EGD) WITH PROPOFOL N/A 06/24/2017   Procedure: ESOPHAGOGASTRODUODENOSCOPY  (EGD) WITH PROPOFOL;  Surgeon: Lin Landsman, MD;  Location: [facility];  Service: Endoscopy;  Laterality: N/A;  Diabetic - insulin and oral meds  . KNEE ARTHROSCOPY Right 11/30/2015   Procedure: ARTHROSCOPY KNEE;  Surgeon: Hessie Knows, MD;  Location: [facility];  Service: Orthopedics;  Laterality: Right;  . KNEE ARTHROSCOPY Right 12/13/2015   Procedure: ARTHROSCOPY KNEE AND DEBRIDEMENT;  Surgeon: Hessie Knows, MD;  Location: [facility];  Service: Orthopedics;  Laterality: Right;  . MRSA abdominal wall    . Right knee arthroscopic    . TOTAL KNEE ARTHROPLASTY Right 08/08/2016   Procedure: TOTAL KNEE ARTHROPLASTY;  Surgeon: Hessie Knows, MD;  Location: [facility];  Service: Orthopedics;  Laterality: Right;  . TUBAL LIGATION  1997    SOCIAL HISTORY:   Social History   Tobacco Use  . Smoking status: Never Smoker  . Smokeless tobacco: Never Used  Substance Use Topics  . Alcohol use: No    Comment: rare    FAMILY HISTORY:   Family History  Problem Relation Age of Onset  . Diabetes Mother   . CAD Mother   . Pneumonia Mother     DRUG ALLERGIES:  No Known Allergies  REVIEW OF SYSTEMS:  CONSTITUTIONAL: No fever, fatigue or weakness.  Positive for weight gain 10 to 15 pounds.  Positive for headache. EYES: Some blurred vision.  Wears glasses. EARS, NOSE, AND THROAT: No tinnitus or ear pain. No sore throat.  Positive  for runny nose and raspy voice. RESPIRATORY: Positive for cough, shortness of breath, and wheezing.  No hemoptysis.  CARDIOVASCULAR: Slight chest pain with coughing.  No orthopnea, edema.  GASTROINTESTINAL: No nausea, diarrhea or abdominal pain. No blood in bowel movements.  With quite a bit of coughing she did have some vomiting GENITOURINARY: No dysuria, hematuria.  ENDOCRINE: No polyuria, nocturia,  HEMATOLOGY: History of anemia, no easy bruising or bleeding SKIN: No rash or lesion. MUSCULOSKELETAL: Positive for left knee pain and left hip pain NEUROLOGIC:  No tingling, numbness, weakness.  PSYCHIATRY: No anxiety or depression.   MEDICATIONS AT HOME:   Prior to Admission medications   Medication Sig Start Date End Date Taking? Authorizing Provider  acetaminophen (TYLENOL 8 HOUR ARTHRITIS PAIN) 650 MG CR tablet Take 650 mg by mouth every 8 (eight) hours as needed for pain.    [provider]  aspirin EC 81 MG tablet Take 81 mg by mouth daily.     [provider]  atorvastatin (LIPITOR) 10 MG tablet Take 1 tablet (10 mg total) by mouth daily. Patient not taking: Reported on 04/28/2018 12/02/17   Olin Hauser, DO  blood glucose meter kit and supplies KIT Dispense based on patient and insurance preference. Use up to four times daily as directed. (FOR ICD-9 250.00, 250.01). 09/05/15   Krebs, Amy Lauren, NP  ferrous sulfate 325 (65 FE) MG tablet Take 325 mg by mouth daily. 02/27/17 02/27/18  [provider]  FLUoxetine (PROZAC) 40 MG capsule Take 1 capsule (40 mg total) by mouth daily. 12/02/17   Karamalegos, Devonne Doughty, DO  glucose blood (ONE TOUCH ULTRA TEST) test strip USE TO CHECK BLOOD SUGAR UP TO 3 TIMES DAILY AS DIRECTED 02/11/18   Karamalegos, Devonne Doughty, DO  ibuprofen (ADVIL,MOTRIN) 400 MG tablet Take 1 tablet (400 mg total) by mouth every 6 (six) hours as needed for moderate pain. 02/06/17   Nicholes Mango, MD  Insulin Glargine (BASAGLAR KWIKPEN) 100 UNIT/ML SOPN Inject 0.2 mLs (20 Units total) into the skin daily. 06/03/18   Karamalegos, Devonne Doughty, DO  Insulin Pen Needle (RELION PEN NEEDLES) 31G X 6 MM MISC 1 each by Does not apply route daily. Use with Toujeo insulin as directed. 10/29/16   Karamalegos, Devonne Doughty, DO  lisinopril (PRINIVIL,ZESTRIL) 20 MG tablet Take 1 tablet (20 mg total) by mouth daily. 12/02/17   Karamalegos, Devonne Doughty, DO  loratadine (CLARITIN) 10 MG tablet Take 1 tablet (10 mg total) by mouth daily. 04/28/18 04/23/19  Parks Ranger, Devonne Doughty, DO  metFORMIN (GLUCOPHAGE) 1000 MG tablet Take 1  tablet (1,000 mg total) by mouth 2 (two) times daily with a meal. 12/02/17   Karamalegos, Devonne Doughty, DO  metoprolol tartrate (LOPRESSOR) 25 MG tablet Take 1 tablet (25 mg total) by mouth 2 (two) times daily. 12/02/17   Karamalegos, Devonne Doughty, DO  omeprazole (PRILOSEC) 20 MG capsule Take 1 capsule (20 mg total) by mouth 2 (two) times daily before a meal. 12/02/17   Karamalegos, Alexander J, DO  ONETOUCH DELICA LANCETS 17B MISC USE TO CHECK BLOOD GLUCOSE UP TO FOUR TIMES DAILY AS DIRECTED 12/18/15   Krebs, Amy Lauren, NP  Semaglutide (OZEMPIC) 1 MG/DOSE SOPN Inject 1 mg into the skin once a week. 04/28/18   Karamalegos, Devonne Doughty, DO   Medication reconciliation still undergoing  VITAL SIGNS:  Blood pressure 108/66, pulse (!) 103, temperature 98.2 F (36.8 C), temperature source Oral, resp. rate (!) 24, height '5\' 6"'$  (1.676 m), weight 88.9 kg,  last menstrual period 07/10/2015, SpO2 96 %.  PHYSICAL EXAMINATION:  GENERAL:  53 y.o.-year-old patient lying in the bed with no acute distress.  EYES: Pupils equal, round, reactive to light and accommodation. No scleral icterus. Extraocular muscles intact.  HEENT: Head atraumatic, normocephalic. Oropharynx and nasopharynx clear.  NECK:  Supple, no jugular venous distention. No thyroid enlargement, no tenderness.  LUNGS: Decreased breath sounds bilaterally, upper airway expiratory wheezing.  Positive rales at the base. No use of accessory muscles of respiration.  CARDIOVASCULAR: S1, S2 tachycardic. No murmurs, rubs, or gallops.  ABDOMEN: Soft, nontender, nondistended. Bowel sounds present. No organomegaly or mass.  EXTREMITIES: Trace pedal edema, no cyanosis, or clubbing.  NEUROLOGIC: Cranial nerves II through XII are intact. Muscle strength 5/5 in all extremities. Sensation intact. Gait not checked.  PSYCHIATRIC: The patient is alert and oriented x 3.  SKIN: No rash, lesion, or ulcer.   LABORATORY PANEL:   CBC Recent Labs  Lab 10/25/18 0653  WBC 9.1   HGB 12.0  HCT 37.2  PLT 387   ------------------------------------------------------------------------------------------------------------------  Chemistries  Recent Labs  Lab 10/25/18 0653  NA 140  K 4.6  CL 108  CO2 24  GLUCOSE 140*  BUN 19  CREATININE 1.00  CALCIUM 9.2   ------------------------------------------------------------------------------------------------------------------  Cardiac Enzymes Recent Labs  Lab 10/25/18 0653  TROPONINI <0.03   ------------------------------------------------------------------------------------------------------------------  RADIOLOGY:  Dg Chest 2 View  Result Date: 10/25/2018 CLINICAL DATA:  Headache 3 days with difficulty breathing. EXAM: CHEST - 2 VIEW COMPARISON:  02/02/2017 FINDINGS: Lungs are hypoinflated demonstrate bilateral central coarse interstitial changes. Small amount of posterior left pleural fluid demonstrated. Cardiomediastinal silhouette and remainder of the exam is unremarkable. IMPRESSION: Hypoinflation with coarse central interstitial changes suggesting interstitial edema versus acute bronchitic process. Small amount of posterior left pleural fluid. Electronically Signed   By: Marin Olp M.D.   On: 10/25/2018 08:14   Ct Angio Chest Pe W And/or Wo Contrast  Result Date: 10/25/2018 CLINICAL DATA:  Dyspnea on exertion EXAM: CT ANGIOGRAPHY CHEST WITH CONTRAST TECHNIQUE: Multidetector CT imaging of the chest was performed using the standard protocol during bolus administration of intravenous contrast. Multiplanar CT image reconstructions and MIPs were obtained to evaluate the vascular anatomy. CONTRAST:  7m OMNIPAQUE IOHEXOL 350 MG/ML SOLN COMPARISON:  Chest x-ray from earlier in the same day. FINDINGS: Cardiovascular: Thoracic aorta demonstrates mild atherosclerotic calcifications. No aneurysmal dilatation is seen. No cardiac enlargement is noted. Coronary calcifications are seen. The pulmonary artery demonstrates a  normal branching pattern without evidence of focal filling defects to suggest pulmonary embolism. Mediastinum/Nodes: Thoracic inlet is within normal limits. No hilar or mediastinal adenopathy is noted. Esophagus is within normal limits. Small sliding-type hiatal hernia is noted. Lungs/Pleura: Bilateral pleural effusions are noted left greater than right. Bilateral atelectasis/early infiltrate is noted in the lower lobes. Mild changes of interstitial edema are noted throughout both lungs similar to that seen on the prior exam. Upper Abdomen: Postsurgical changes consistent with prior cholecystectomy noted. Hiatal hernia is again seen. Musculoskeletal: Degenerative changes of the thoracic spine are noted. No acute bony abnormality is seen. Review of the MIP images confirms the above findings. IMPRESSION: Changes of CHF with interstitial edema and pleural effusions. Bibasilar atelectasis/early infiltrate. No evidence of pulmonary emboli. Electronically Signed   By: MInez CatalinaM.D.   On: 10/25/2018 11:15    EKG:   Sinus tachycardia, PVCs and LVH  IMPRESSION AND PLAN:   1.  Acute congestive heart failure.  We will give Lasix  20 mg IV twice daily.  Patient already on lisinopril and metoprolol 2.  Pneumonia.  Possible viral versus bacterial.  Will start Rocephin and Zithromax.  Flu swab negative.  Send off viral respiratory panel.  Send off procalcitonin.  Nebulizer treatments.  Hold off on systemic steroids even though given steroids in the emergency room. 3.  Type 2 diabetes mellitus.  Need to hold Glucophage with CAT scan done today.  Will put on sliding scale insulin.  Sugars will likely be high with the steroids given in the emergency room. 4.  Hypertension on lisinopril 5.  Obesity with a BMI of 31.64.  Weight loss needed 6.  Depression on Paxil 7.  Hyperlipidemia unspecified on atorvastatin    All the records are reviewed and case discussed with ED provider. Management plans discussed with the  patient, family and they are in agreement.  CODE STATUS: Full code  TOTAL TIME TAKING CARE OF THIS PATIENT: 50 minutes, including ACP time.    Loletha Grayer M.D on 10/25/2018 at 12:01 PM  Between 7am to 6pm - Pager - [phone]  After 6pm call admission pager [phone]  [facility]  [phone]  CC: Primary care physician; Olin Hauser, DO

## 2018-10-25 NOTE — ED Notes (Signed)
ED TO INPATIENT HANDOFF REPORT  ED Nurse Name and Phone #:  TAMMY RN   S Name/Age/Gender Patricia [name] 53 y.o. female Room/Bed: ED16A/ED16A  Code Status   Code Status: Full Code  Home/SNF/Other Home Patient oriented to: self Is this baseline? Yes   Triage Complete: Triage complete  Chief Complaint Headache, SOB  Triage Note Pt arrives ambulatory to triage with c/o HA x 3 days while awake. Pt has increased work of breathing at this time. Pt also reports dyspnea on excursion. Pt is able to talk in complete sentences at this time.    Allergies No Known Allergies  Level of Care/Admitting Diagnosis ED Disposition    ED Disposition Condition Comment   Admit  Hospital Area: [facility] [[id number]]  Level of Care: Med-Surg [16]  Diagnosis: CHF (congestive heart failure) [facility]) [[id number]]  Admitting Physician: Alford Highland [[id number]]  Attending Physician: Alford Highland [phone]  Estimated length of stay: past midnight tomorrow  Certification:: I certify this patient will need inpatient services for at least 2 midnights  PT Class (Do Not Modify): Inpatient [101]  PT Acc Code (Do Not Modify): Private [1]       B Medical/Surgery History Past Medical History:  Diagnosis Date  . Anemia   . Arthritis   . Complication of anesthesia 12/13/2015   aspiration after breathing tube removed after several surgeries  . Diabetes mellitus without complication (HCC)   . Dyspnea   . GERD (gastroesophageal reflux disease)   . Hyperlipidemia   . Hypertension   . Infection 2012   right knee infection, required arthroscopy, long term antibiotic treatment, aspiration therapy   Past Surgical History:  Procedure Laterality Date  . CHOLECYSTECTOMY    . COLONOSCOPY WITH PROPOFOL N/A 06/24/2017   Procedure: COLONOSCOPY WITH PROPOFOL;  Surgeon: Toney Reil, MD;  Location[facility];  Service: Endoscopy;  Laterality: N/A;  .  ESOPHAGOGASTRODUODENOSCOPY (EGD) WITH PROPOFOL N/A 06/24/2017   Procedure: ESOPHAGOGASTRODUODENOSCOPY (EGD) WITH PROPOFOL;  Surgeon: Toney Reil, MD;  Location: [facility];  Service: Endoscopy;  Laterality: N/A;  Diabetic - insulin and oral meds  . KNEE ARTHROSCOPY Right 11/30/2015   Procedure: ARTHROSCOPY KNEE;  Surgeon: Kennedy Bucker, MD;  Location: [facility];  Service: Orthopedics;  Laterality: Right;  . KNEE ARTHROSCOPY Right 12/13/2015   Procedure: ARTHROSCOPY KNEE AND DEBRIDEMENT;  Surgeon: Kennedy Bucker, MD;  Location: [facility];  Service: Orthopedics;  Laterality: Right;  . MRSA abdominal wall    . Right knee arthroscopic    . TOTAL KNEE ARTHROPLASTY Right 08/08/2016   Procedure: TOTAL KNEE ARTHROPLASTY;  Surgeon: Kennedy Bucker, MD;  Location: [facility];  Service: Orthopedics;  Laterality: Right;  . TUBAL LIGATION  1997     A IV Location/Drains/Wounds Patient Lines/Drains/Airways Status   Active Line/Drains/Airways    Name:   Placement date:   Placement time:   Site:   Days:   Peripheral IV 10/25/18 Left Hand   10/25/18    0827    Hand   less than 1   Peripheral IV 10/25/18 Left Antecubital   10/25/18    1046    Antecubital   less than 1   Incision (Closed) 06/24/17 N/A Other (Comment)   06/24/17    0807     488          Intake/Output Last 24 hours  Intake/Output Summary (Last 24 hours) at 10/25/2018 1756 Last data filed at 10/25/2018 1405 Gross per 24 hour  Intake 175  ml  Output -  Net 175 ml    Labs/Imaging Results for orders placed or performed during the hospital encounter of 10/25/18 (from the past 48 hour(s))  CBC with Differential     Status: None   Collection Time: 10/25/18  6:53 AM  Result Value Ref Range   WBC 9.1 4.0 - 10.5 K/uL   RBC 4.22 3.87 - 5.11 MIL/uL   Hemoglobin 12.0 12.0 - 15.0 g/dL   HCT 16.137.2 09.636.0 - 04.546.0 %   MCV 88.2 80.0 - 100.0 fL   MCH 28.4 26.0 - 34.0 pg   MCHC 32.3 30.0 - 36.0 g/dL   RDW 40.913.7 81.111.5 - 91.415.5 %   Platelets 387 150 -  400 K/uL   nRBC 0.0 0.0 - 0.2 %   Neutrophils Relative % 66 %   Neutro Abs 6.0 1.7 - 7.7 K/uL   Lymphocytes Relative 23 %   Lymphs Abs 2.1 0.7 - 4.0 K/uL   Monocytes Relative 5 %   Monocytes Absolute 0.4 0.1 - 1.0 K/uL   Eosinophils Relative 5 %   Eosinophils Absolute 0.4 0.0 - 0.5 K/uL   Basophils Relative 1 %   Basophils Absolute 0.1 0.0 - 0.1 K/uL   Immature Granulocytes 0 %   Abs Immature Granulocytes 0.04 0.00 - 0.07 K/uL    Comment: Performed at [facility], [address] [place], [place] [address]  Basic metabolic panel     Status: Abnormal   Collection Time: 10/25/18  6:53 AM  Result Value Ref Range   Sodium 140 135 - 145 mmol/L   Potassium 4.6 3.5 - 5.1 mmol/L   Chloride 108 98 - 111 mmol/L   CO2 24 22 - 32 mmol/L   Glucose, Bld 140 (H) 70 - 99 mg/dL   BUN 19 6 - 20 mg/dL   Creatinine, Ser 6.211.00 0.44 - 1.00 mg/dL   Calcium 9.2 8.9 - 30.810.3 mg/dL   GFR calc non Af Amer >60 >60 mL/min   GFR calc Af Amer >60 >60 mL/min   Anion gap 8 5 - 15    Comment: Performed at [facility], [address] [place], [place] [address]  Troponin I - Add-On to previous collection     Status: None   Collection Time: 10/25/18  6:53 AM  Result Value Ref Range   Troponin I <0.03 <0.03 ng/mL    Comment: Performed at [facility], [address] [place], [place] [address]  Brain natriuretic peptide     Status: Abnormal   Collection Time: 10/25/18  6:53 AM  Result Value Ref Range   B Natriuretic Peptide 766.0 (H) 0.0 - 100.0 pg/mL    Comment: Performed at [facility], [address] [place], [place] [address]  Influenza panel by PCR (type A & B)     Status: None   Collection Time: 10/25/18  8:37 AM  Result Value Ref Range   Influenza A By PCR NEGATIVE NEGATIVE   Influenza B By PCR NEGATIVE NEGATIVE    Comment: (NOTE) The Xpert Xpress Flu assay is intended as an aid in the diagnosis of  influenza and should not be used as a sole basis for  treatment.  This  assay is FDA approved for nasopharyngeal swab specimens only. Nasal  washings and aspirates are unacceptable for Xpert Xpress Flu testing. Performed at [facility], [address] [place], [place] [address]   Fibrin derivatives D-Dimer [facility] only)     Status: Abnormal  Collection Time: 10/25/18  8:37 AM  Result Value Ref Range   Fibrin derivatives D-dimer ([facility]) 1,376.81 (H) 0.00 - 499.00 ng/mL (FEU)    Comment: (NOTE) <> Exclusion of Venous Thromboembolism (VTE) - OUTPATIENT ONLY   (Emergency Department or [place])   0-499 ng/ml (FEU): With a low to intermediate pretest probability                      for VTE this test result excludes the diagnosis                      of VTE.   >499 ng/ml (FEU) : VTE not excluded; additional work up for VTE is                      required. <> Testing on Inpatients and Evaluation of Disseminated Intravascular   Coagulation (DIC) Reference Range:   0-499 ng/ml (FEU) Performed at [facility], [address] [place], [place] [address]   Procalcitonin - Baseline     Status: None   Collection Time: 10/25/18  2:11 PM  Result Value Ref Range   Procalcitonin <0.10 ng/mL    Comment:        Interpretation: PCT (Procalcitonin) <= 0.5 ng/mL: Systemic infection (sepsis) is not likely. Local bacterial infection is possible. (NOTE)       Sepsis PCT Algorithm           Lower Respiratory Tract                                      Infection PCT Algorithm    ----------------------------     ----------------------------         PCT < 0.25 ng/mL                PCT < 0.10 ng/mL         Strongly encourage             Strongly discourage   discontinuation of antibiotics    initiation of antibiotics    ----------------------------     -----------------------------       PCT 0.25 - 0.50 ng/mL            PCT 0.10 - 0.25 ng/mL               OR       >80% decrease in PCT            Discourage initiation of                                             antibiotics      Encourage discontinuation           of antibiotics    ----------------------------     -----------------------------         PCT >= 0.50 ng/mL              PCT 0.26 - 0.50 ng/mL               AND        <80% decrease in PCT             Encourage initiation of  antibiotics       Encourage continuation           of antibiotics    ----------------------------     -----------------------------        PCT >= 0.50 ng/mL                  PCT > 0.50 ng/mL               AND         increase in PCT                  Strongly encourage                                      initiation of antibiotics    Strongly encourage escalation           of antibiotics                                     -----------------------------                                           PCT <= 0.25 ng/mL                                                 OR                                        > 80% decrease in PCT                                     Discontinue / Do not initiate                                             antibiotics Performed at [facility], [address] [place], [place] [address]   Hemoglobin A1c     Status: Abnormal   Collection Time: 10/25/18  2:11 PM  Result Value Ref Range   Hgb A1c MFr Bld 6.6 (H) 4.8 - 5.6 %    Comment: (NOTE) Pre diabetes:          5.7%-6.4% Diabetes:              >6.4% Glycemic control for   <7.0% adults with diabetes    Mean Plasma Glucose 142.72 mg/dL    Comment: Performed at [facility], [address], [place], [place] [address]  Glucose, capillary     Status: Abnormal   Collection Time: 10/25/18  5:17 PM  Result Value Ref Range   Glucose-Capillary 266 (H) 70 - 99 mg/dL   Dg Chest 2 View  Result Date: 10/25/2018 CLINICAL DATA:  Headache 3 days with difficulty breathing. EXAM: CHEST - 2 VIEW COMPARISON:  02/02/2017 FINDINGS: Lungs  are hypoinflated demonstrate  bilateral central coarse interstitial changes. Small amount of posterior left pleural fluid demonstrated. Cardiomediastinal silhouette and remainder of the exam is unremarkable. IMPRESSION: Hypoinflation with coarse central interstitial changes suggesting interstitial edema versus acute bronchitic process. Small amount of posterior left pleural fluid. Electronically Signed   By: Elberta Fortis M.D.   On: 10/25/2018 08:14   Ct Angio Chest Pe W And/or Wo Contrast  Result Date: 10/25/2018 CLINICAL DATA:  Dyspnea on exertion EXAM: CT ANGIOGRAPHY CHEST WITH CONTRAST TECHNIQUE: Multidetector CT imaging of the chest was performed using the standard protocol during bolus administration of intravenous contrast. Multiplanar CT image reconstructions and MIPs were obtained to evaluate the vascular anatomy. CONTRAST:  75mL OMNIPAQUE IOHEXOL 350 MG/ML SOLN COMPARISON:  Chest x-ray from earlier in the same day. FINDINGS: Cardiovascular: Thoracic aorta demonstrates mild atherosclerotic calcifications. No aneurysmal dilatation is seen. No cardiac enlargement is noted. Coronary calcifications are seen. The pulmonary artery demonstrates a normal branching pattern without evidence of focal filling defects to suggest pulmonary embolism. Mediastinum/Nodes: Thoracic inlet is within normal limits. No hilar or mediastinal adenopathy is noted. Esophagus is within normal limits. Small sliding-type hiatal hernia is noted. Lungs/Pleura: Bilateral pleural effusions are noted left greater than right. Bilateral atelectasis/early infiltrate is noted in the lower lobes. Mild changes of interstitial edema are noted throughout both lungs similar to that seen on the prior exam. Upper Abdomen: Postsurgical changes consistent with prior cholecystectomy noted. Hiatal hernia is again seen. Musculoskeletal: Degenerative changes of the thoracic spine are noted. No acute bony abnormality is seen. Review of the MIP images confirms the above findings.  IMPRESSION: Changes of CHF with interstitial edema and pleural effusions. Bibasilar atelectasis/early infiltrate. No evidence of pulmonary emboli. Electronically Signed   By: Alcide Clever M.D.   On: 10/25/2018 11:15    Pending Labs Unresulted Labs (From admission, onward)    Start     Ordered   11/01/18 0500  Creatinine, serum  (enoxaparin (LOVENOX)    CrCl >/= 30 ml/min)  Weekly,   STAT    Comments:  while on enoxaparin therapy    10/25/18 1213   10/26/18 0500  Procalcitonin  Daily,   STAT     10/25/18 1212   10/26/18 0500  Basic metabolic panel  Tomorrow morning,   STAT     10/25/18 1213   10/26/18 0500  CBC  Tomorrow morning,   STAT     10/25/18 1213   10/25/18 1214  HIV antibody (Routine Testing)  Add-on,   AD     10/25/18 1213   10/25/18 1154  Respiratory Panel by PCR  (Respiratory virus panel with precautions)  Once,   STAT     10/25/18 1153          Vitals/Pain Today's Vitals   10/25/18 1445 10/25/18 1500 10/25/18 1543 10/25/18 1722  BP:  105/72  110/68  Pulse: (!) 114 (!) 117  (!) 114  Resp:    (!) 22  Temp:      TempSrc:      SpO2: 99% 96%  96%  Weight:      Height:      PainSc:   0-No pain 0-No pain    Isolation Precautions Droplet precaution  Medications Medications  ipratropium-albuterol (DUONEB) 0.5-2.5 (3) MG/3ML nebulizer solution 3 mL (3 mLs Nebulization Given 10/25/18 1312)  budesonide (PULMICORT) nebulizer solution 0.5 mg (has no administration in time range)  cefTRIAXone (ROCEPHIN) 2 g in sodium chloride 0.9 % 100 mL IVPB (  0 g Intravenous Stopped 10/25/18 1405)  azithromycin (ZITHROMAX) tablet 500 mg (500 mg Oral Given 10/25/18 1311)    Followed by  azithromycin (ZITHROMAX) tablet 250 mg (has no administration in time range)  furosemide (LASIX) injection 20 mg (20 mg Intravenous Given 10/25/18 1215)  aspirin EC tablet 81 mg (has no administration in time range)  acetaminophen (TYLENOL) tablet 650 mg (has no administration in time range)  lisinopril  (PRINIVIL,ZESTRIL) tablet 20 mg (has no administration in time range)  metoprolol tartrate (LOPRESSOR) tablet 25 mg (25 mg Oral Given 10/25/18 1311)  FLUoxetine (PROZAC) capsule 40 mg (has no administration in time range)  pantoprazole (PROTONIX) EC tablet 40 mg (has no administration in time range)  ferrous sulfate tablet 325 mg (has no administration in time range)  loratadine (CLARITIN) tablet 10 mg (has no administration in time range)  insulin aspart (novoLOG) injection 0-5 Units (has no administration in time range)  insulin aspart (novoLOG) injection 0-15 Units (8 Units Subcutaneous Given 10/25/18 1721)  enoxaparin (LOVENOX) injection 40 mg (has no administration in time range)  ondansetron (ZOFRAN) tablet 4 mg (has no administration in time range)    Or  ondansetron (ZOFRAN) injection 4 mg (has no administration in time range)  guaiFENesin (ROBITUSSIN) 100 MG/5ML solution 100 mg (has no administration in time range)  insulin glargine (LANTUS) injection 20 Units (has no administration in time range)  ipratropium-albuterol (DUONEB) 0.5-2.5 (3) MG/3ML nebulizer solution 3 mL (3 mLs Nebulization Given 10/25/18 0659)  ipratropium-albuterol (DUONEB) 0.5-2.5 (3) MG/3ML nebulizer solution 3 mL (3 mLs Nebulization Given 10/25/18 0659)  ipratropium-albuterol (DUONEB) 0.5-2.5 (3) MG/3ML nebulizer solution 3 mL (3 mLs Nebulization Given 10/25/18 0828)  methylPREDNISolone sodium succinate (SOLU-MEDROL) 125 mg/2 mL injection 125 mg (125 mg Intravenous Given 10/25/18 0829)  sodium chloride 0.9 % bolus 1,000 mL (0 mLs Intravenous Stopped 10/25/18 1013)  metoCLOPramide (REGLAN) injection 10 mg (10 mg Intravenous Given 10/25/18 0829)  ketorolac (TORADOL) 30 MG/ML injection 15 mg (15 mg Intravenous Given 10/25/18 0829)  iohexol (OMNIPAQUE) 350 MG/ML injection 75 mL (75 mLs Intravenous Contrast Given 10/25/18 1057)  furosemide (LASIX) injection 20 mg (20 mg Intravenous Given 10/25/18 1216)    Mobility walks Low fall risk    Focused Assessments Cardiac Assessment Handoff:    Lab Results  Component Value Date   TROPONINI <0.03 10/25/2018   No results found for: DDIMER Does the Patient currently have chest pain? No     R Recommendations: See Admitting Provider Note  Report given to: Tammy RN

## 2018-10-25 NOTE — ED Triage Notes (Signed)
Pt arrives ambulatory to triage with c/o HA x 3 days while awake. Pt has increased work of breathing at this time. Pt also reports dyspnea on excursion. Pt is able to talk in complete sentences at this time.

## 2018-10-25 NOTE — Progress Notes (Signed)
*  PRELIMINARY RESULTS* Echocardiogram 2D Echocardiogram has been performed.  Cristela Blue 10/25/2018, 1:47 PM

## 2018-10-25 NOTE — Progress Notes (Signed)
Patient ID: Patricia [name], female   DOB: 1965-11-12, 53 y.o.   MRN: [id number]  ACP note  Patient present  Diagnosis: Acute CHF, pneumonia, type 2 diabetes, obesity, hypertension  CODE STATUS discussed and patient wishes to be a full code.  Patient coming in with shortness of breath and cough and headache.  In the ER she was diagnosed with CHF which is a new diagnosis that has a prognosis that goes along with it.  Need to figure out whether this is diastolic or systolic congestive heart failure.  I also think there is an infectious component here.  We will give antibiotics.  Explained with CHF that she will need to weigh herself on a daily basis and watch salt intake.  Time spent on ACP discussion 17 minutes Dr. Alford Highland

## 2018-10-25 NOTE — ED Notes (Signed)
sst x2 lav and green top drawn and sent to lab.

## 2018-10-25 NOTE — Consult Note (Signed)
Patricia COLUMBO is a 53 y.o. female  [id number]  Primary Cardiologist: Adrian Blackwater Reason for Consultation: Chest pain  HPI: This is a 53 year old white female with a past medical history of hyperlipidemia and diabetes mellitus presented to the hospital with chest tightness cough and shortness of breath.  She states that she woke up with cough shortness of breath and wheezing associated with tightness in the chest.   Review of Systems: Does have some orthopnea PND and leg swelling   Past Medical History:  Diagnosis Date  . Anemia   . Arthritis   . Complication of anesthesia 12/13/2015   aspiration after breathing tube removed after several surgeries  . Diabetes mellitus without complication (HCC)   . Dyspnea   . GERD (gastroesophageal reflux disease)   . Hyperlipidemia   . Hypertension   . Infection 2012   right knee infection, required arthroscopy, long term antibiotic treatment, aspiration therapy    (Not in a hospital admission)    . [START ON 10/26/2018] aspirin EC  81 mg Oral Daily  . [START ON 10/26/2018] azithromycin  250 mg Oral Daily  . budesonide (PULMICORT) nebulizer solution  0.5 mg Nebulization BID  . enoxaparin (LOVENOX) injection  40 mg Subcutaneous Q24H  . [START ON 10/26/2018] ferrous sulfate  325 mg Oral Q breakfast  . [START ON 10/26/2018] FLUoxetine  40 mg Oral Daily  . furosemide  20 mg Intravenous BID  . insulin aspart  0-15 Units Subcutaneous TID WC  . insulin aspart  0-5 Units Subcutaneous QHS  . insulin glargine  20 Units Subcutaneous QHS  . ipratropium-albuterol  3 mL Nebulization Q6H  . [START ON 10/26/2018] lisinopril  20 mg Oral Daily  . [START ON 10/26/2018] loratadine  10 mg Oral Daily  . metoprolol tartrate  25 mg Oral BID  . pantoprazole  40 mg Oral QHS    Infusions: . cefTRIAXone (ROCEPHIN)  IV Stopped (10/25/18 1405)    No Known Allergies  Social History   Socioeconomic History  . Marital status: Married    Spouse name: Not on  file  . Number of children: Not on file  . Years of education: Not on file  . Highest education level: Not on file  Occupational History  . Occupation: Psychologist, occupational, Film/video editor)  Social Needs  . Financial resource strain: Not on file  . Food insecurity:    Worry: Not on file    Inability: Not on file  . Transportation needs:    Medical: Not on file    Non-medical: Not on file  Tobacco Use  . Smoking status: Never Smoker  . Smokeless tobacco: Never Used  Substance and Sexual Activity  . Alcohol use: No    Comment: rare  . Drug use: No  . Sexual activity: Never  Lifestyle  . Physical activity:    Days per week: Not on file    Minutes per session: Not on file  . Stress: Not on file  Relationships  . Social connections:    Talks on phone: Not on file    Gets together: Not on file    Attends religious service: Not on file    Active member of club or organization: Not on file    Attends meetings of clubs or organizations: Not on file    Relationship status: Not on file  . Intimate partner violence:    Fear of current or ex partner: Not on file    Emotionally abused: Not on  file    Physically abused: Not on file    Forced sexual activity: Not on file  Other Topics Concern  . Not on file  Social History Narrative  . Not on file    Family History  Problem Relation Age of Onset  . Diabetes Mother   . CAD Mother   . Pneumonia Mother     PHYSICAL EXAM: Vitals:   10/25/18 1330 10/25/18 1400  BP: 122/72 126/80  Pulse: (!) 106 (!) 108  Resp: (!) 21 20  Temp:    SpO2: 95% 95%     Intake/Output Summary (Last 24 hours) at 10/25/2018 1507 Last data filed at 10/25/2018 1405 Gross per 24 hour  Intake 175 ml  Output -  Net 175 ml    General:  Well appearing. No respiratory difficulty HEENT: normal Neck: supple. no JVD. Carotids 2+ bilat; no bruits. No lymphadenopathy or thryomegaly appreciated. Cor: PMI nondisplaced. Regular rate & rhythm. No rubs, gallops or  murmurs. Lungs: clear Abdomen: soft, nontender, nondistended. No hepatosplenomegaly. No bruits or masses. Good bowel sounds. Extremities: no cyanosis, clubbing, rash, edema Neuro: alert & oriented x 3, cranial nerves grossly intact. moves all 4 extremities w/o difficulty. Affect pleasant.  ECG: Normal sinus rhythm nonspecific ST-T changes  Results for orders placed or performed during the hospital encounter of 10/25/18 (from the past 24 hour(s))  CBC with Differential     Status: None   Collection Time: 10/25/18  6:53 AM  Result Value Ref Range   WBC 9.1 4.0 - 10.5 K/uL   RBC 4.22 3.87 - 5.11 MIL/uL   Hemoglobin 12.0 12.0 - 15.0 g/dL   HCT 19.1 47.8 - 29.5 %   MCV 88.2 80.0 - 100.0 fL   MCH 28.4 26.0 - 34.0 pg   MCHC 32.3 30.0 - 36.0 g/dL   RDW 62.1 30.8 - 65.7 %   Platelets 387 150 - 400 K/uL   nRBC 0.0 0.0 - 0.2 %   Neutrophils Relative % 66 %   Neutro Abs 6.0 1.7 - 7.7 K/uL   Lymphocytes Relative 23 %   Lymphs Abs 2.1 0.7 - 4.0 K/uL   Monocytes Relative 5 %   Monocytes Absolute 0.4 0.1 - 1.0 K/uL   Eosinophils Relative 5 %   Eosinophils Absolute 0.4 0.0 - 0.5 K/uL   Basophils Relative 1 %   Basophils Absolute 0.1 0.0 - 0.1 K/uL   Immature Granulocytes 0 %   Abs Immature Granulocytes 0.04 0.00 - 0.07 K/uL  Basic metabolic panel     Status: Abnormal   Collection Time: 10/25/18  6:53 AM  Result Value Ref Range   Sodium 140 135 - 145 mmol/L   Potassium 4.6 3.5 - 5.1 mmol/L   Chloride 108 98 - 111 mmol/L   CO2 24 22 - 32 mmol/L   Glucose, Bld 140 (H) 70 - 99 mg/dL   BUN 19 6 - 20 mg/dL   Creatinine, Ser 8.46 0.44 - 1.00 mg/dL   Calcium 9.2 8.9 - 96.2 mg/dL   GFR calc non Af Amer >60 >60 mL/min   GFR calc Af Amer >60 >60 mL/min   Anion gap 8 5 - 15  Troponin I - Add-On to previous collection     Status: None   Collection Time: 10/25/18  6:53 AM  Result Value Ref Range   Troponin I <0.03 <0.03 ng/mL  Brain natriuretic peptide     Status: Abnormal   Collection Time:  10/25/18  6:53 AM  Result Value Ref Range   B Natriuretic Peptide 766.0 (H) 0.0 - 100.0 pg/mL  Influenza panel by PCR (type A & B)     Status: None   Collection Time: 10/25/18  8:37 AM  Result Value Ref Range   Influenza A By PCR NEGATIVE NEGATIVE   Influenza B By PCR NEGATIVE NEGATIVE  Fibrin derivatives D-Dimer ([facility] only)     Status: Abnormal   Collection Time: 10/25/18  8:37 AM  Result Value Ref Range   Fibrin derivatives D-dimer ([facility]) 1,376.81 (H) 0.00 - 499.00 ng/mL (FEU)   Dg Chest 2 View  Result Date: 10/25/2018 CLINICAL DATA:  Headache 3 days with difficulty breathing. EXAM: CHEST - 2 VIEW COMPARISON:  02/02/2017 FINDINGS: Lungs are hypoinflated demonstrate bilateral central coarse interstitial changes. Small amount of posterior left pleural fluid demonstrated. Cardiomediastinal silhouette and remainder of the exam is unremarkable. IMPRESSION: Hypoinflation with coarse central interstitial changes suggesting interstitial edema versus acute bronchitic process. Small amount of posterior left pleural fluid. Electronically Signed   By: Elberta Fortis M.D.   On: 10/25/2018 08:14   Ct Angio Chest Pe W And/or Wo Contrast  Result Date: 10/25/2018 CLINICAL DATA:  Dyspnea on exertion EXAM: CT ANGIOGRAPHY CHEST WITH CONTRAST TECHNIQUE: Multidetector CT imaging of the chest was performed using the standard protocol during bolus administration of intravenous contrast. Multiplanar CT image reconstructions and MIPs were obtained to evaluate the vascular anatomy. CONTRAST:  15mL OMNIPAQUE IOHEXOL 350 MG/ML SOLN COMPARISON:  Chest x-ray from earlier in the same day. FINDINGS: Cardiovascular: Thoracic aorta demonstrates mild atherosclerotic calcifications. No aneurysmal dilatation is seen. No cardiac enlargement is noted. Coronary calcifications are seen. The pulmonary artery demonstrates a normal branching pattern without evidence of focal filling defects to suggest pulmonary embolism. Mediastinum/Nodes:  Thoracic inlet is within normal limits. No hilar or mediastinal adenopathy is noted. Esophagus is within normal limits. Small sliding-type hiatal hernia is noted. Lungs/Pleura: Bilateral pleural effusions are noted left greater than right. Bilateral atelectasis/early infiltrate is noted in the lower lobes. Mild changes of interstitial edema are noted throughout both lungs similar to that seen on the prior exam. Upper Abdomen: Postsurgical changes consistent with prior cholecystectomy noted. Hiatal hernia is again seen. Musculoskeletal: Degenerative changes of the thoracic spine are noted. No acute bony abnormality is seen. Review of the MIP images confirms the above findings. IMPRESSION: Changes of CHF with interstitial edema and pleural effusions. Bibasilar atelectasis/early infiltrate. No evidence of pulmonary emboli. Electronically Signed   By: Alcide Clever M.D.   On: 10/25/2018 11:15     ASSESSMENT AND PLAN: Congestive heart failure/pneumonia and exacerbation of COPD with atypical chest pain.  CT of the chest reveals no evidence of pulmonary embolus.  Will get echocardiogram and start the patient on IV Lasix and rule out MI and do outpatient work-up upon discharge.  May have to be ruled out for coronary artery disease as an outpatient. Khloe Hunkele A

## 2018-10-25 NOTE — ED Provider Notes (Signed)
[facility] Emergency Department Provider Note  ____________________________________________  Time seen: Approximately 7:50 AM  I have reviewed the triage vital signs and the nursing notes.   HISTORY  Chief Complaint Headache and Shortness of Breath   HPI Patricia [name] is a 53 y.o. female with a history of anemia, eosinophilia, GERD, diabetes and hyperlipidemia who presents for evaluation of shortness of breath.  Patient reports a cough for the last 2 to 3 weeks.  Over the last several days the cough has become productive of clear sputum.  This morning patient reports that her shortness of breath became severe and constant.  She had a pulse oximetry at home and noted that her oxygenation was in the 80s which prompted her visit to the emergency room.  Patient denies history of asthma, COPD, or smoking however she did have a albuterol inhaler at home from a prior hospital visit which she tried with no significant relief.  She denies any personal or family history of blood clots, recent travel immobilization, leg pain or swelling, hemoptysis, or exogenous hormones.  She is complaining of pressure in the center of her chest that was present this morning.  She denies fever, chills.  She has had a few episodes of posttussive emesis, no diarrhea or abdominal pain.  Patient also reports a headache which she has had for the last 2 to 3 days, the headache is diffuse and throbbing, currently mild.  No neck stiffness or sore throat.  Past Medical History:  Diagnosis Date  . Anemia   . Arthritis   . Complication of anesthesia 12/13/2015   aspiration after breathing tube removed after several surgeries  . Dyspnea   . GERD (gastroesophageal reflux disease)   . Hyperlipidemia   . Infection 2012   right knee infection, required arthroscopy, long term antibiotic treatment, aspiration therapy    Patient Active Problem List   Diagnosis Date Noted  . Obesity (BMI 30.0-34.9)  12/02/2017  . Barrett esophagus 06/29/2017  . Iron deficiency anemia due to chronic blood loss 05/12/2017  . Anemia due to chronic illness 04/08/2017  . Elevated IgE level 03/02/2017  . Eosinophil count raised 03/02/2017  . 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 GERD (gastroesophageal reflux disease) 12/18/2016  . History of hiatal hernia 12/18/2016  . Esophageal dysmotility 12/18/2016  . Anxiety disorder 10/31/2016  . Hyperlipidemia associated with type 2 diabetes mellitus ([place]) 10/31/2016  . S/P total knee arthroplasty, right 10/31/2016  . Primary osteoarthritis of right knee 08/08/2016  . History of septic arthritis 11/30/2015  . Hypertension 09/05/2015  . BPPV (benign paroxysmal positional vertigo) 09/05/2015  . Insomnia 09/05/2015  . Controlled type 2 diabetes mellitus with neuropathy ([place]) 09/05/2015    Past Surgical History:  Procedure Laterality Date  . CHOLECYSTECTOMY    . COLONOSCOPY WITH PROPOFOL N/A 06/24/2017   Procedure: COLONOSCOPY WITH PROPOFOL;  Surgeon: Lin Landsman, MD;  Location: [place];  Service: Endoscopy;  Laterality: N/A;  . ESOPHAGOGASTRODUODENOSCOPY (EGD) WITH PROPOFOL N/A 06/24/2017   Procedure: ESOPHAGOGASTRODUODENOSCOPY (EGD) WITH PROPOFOL;  Surgeon: Lin Landsman, MD;  Location: [place];  Service: Endoscopy;  Laterality: N/A;  Diabetic - insulin and oral meds  . KNEE ARTHROSCOPY Right 11/30/2015   Procedure: ARTHROSCOPY KNEE;  Surgeon: Hessie Knows, MD;  Location: [facility];  Service: Orthopedics;  Laterality: Right;  . KNEE ARTHROSCOPY Right 12/13/2015   Procedure: ARTHROSCOPY KNEE AND DEBRIDEMENT;  Surgeon: Hessie Knows, MD;  Location: [facility];  Service: Orthopedics;  Laterality: Right;  . MRSA abdominal wall    . Right knee arthroscopic    . TOTAL KNEE ARTHROPLASTY Right 08/08/2016   Procedure: TOTAL KNEE ARTHROPLASTY;  Surgeon: Hessie Knows, MD;  Location: [facility];  Service: Orthopedics;  Laterality:  Right;  . TUBAL LIGATION  1997    Prior to Admission medications   Medication Sig Start Date End Date Taking? Authorizing Provider  acetaminophen (TYLENOL 8 HOUR ARTHRITIS PAIN) 650 MG CR tablet Take 650 mg by mouth every 8 (eight) hours as needed for pain.    [provider]  aspirin EC 81 MG tablet Take 81 mg by mouth daily.     [provider]  atorvastatin (LIPITOR) 10 MG tablet Take 1 tablet (10 mg total) by mouth daily. Patient not taking: Reported on 04/28/2018 12/02/17   Olin Hauser, DO  blood glucose meter kit and supplies KIT Dispense based on patient and insurance preference. Use up to four times daily as directed. (FOR ICD-9 250.00, 250.01). 09/05/15   Krebs, Amy Lauren, NP  ferrous sulfate 325 (65 FE) MG tablet Take 325 mg by mouth daily. 02/27/17 02/27/18  [provider]  FLUoxetine (PROZAC) 40 MG capsule Take 1 capsule (40 mg total) by mouth daily. 12/02/17   Karamalegos, Devonne Doughty, DO  glucose blood (ONE TOUCH ULTRA TEST) test strip USE TO CHECK BLOOD SUGAR UP TO 3 TIMES DAILY AS DIRECTED 02/11/18   Karamalegos, Devonne Doughty, DO  ibuprofen (ADVIL,MOTRIN) 400 MG tablet Take 1 tablet (400 mg total) by mouth every 6 (six) hours as needed for moderate pain. 02/06/17   Nicholes Mango, MD  Insulin Glargine (BASAGLAR KWIKPEN) 100 UNIT/ML SOPN Inject 0.2 mLs (20 Units total) into the skin daily. 06/03/18   Karamalegos, Devonne Doughty, DO  Insulin Pen Needle (RELION PEN NEEDLES) 31G X 6 MM MISC 1 each by Does not apply route daily. Use with [place] insulin as directed. 10/29/16   Karamalegos, Devonne Doughty, DO  lisinopril (PRINIVIL,ZESTRIL) 20 MG tablet Take 1 tablet (20 mg total) by mouth daily. 12/02/17   Karamalegos, Devonne Doughty, DO  loratadine (CLARITIN) 10 MG tablet Take 1 tablet (10 mg total) by mouth daily. 04/28/18 04/23/19  Parks Ranger, Devonne Doughty, DO  metFORMIN (GLUCOPHAGE) 1000 MG tablet Take 1 tablet (1,000 mg total) by mouth 2 (two) times daily with a meal.  12/02/17   Karamalegos, Devonne Doughty, DO  metoprolol tartrate (LOPRESSOR) 25 MG tablet Take 1 tablet (25 mg total) by mouth 2 (two) times daily. 12/02/17   Karamalegos, Devonne Doughty, DO  omeprazole (PRILOSEC) 20 MG capsule Take 1 capsule (20 mg total) by mouth 2 (two) times daily before a meal. 12/02/17   Karamalegos, Alexander J, DO  ONETOUCH DELICA LANCETS 67E MISC USE TO CHECK BLOOD GLUCOSE UP TO FOUR TIMES DAILY AS DIRECTED 12/18/15   Krebs, Amy Lauren, NP  Semaglutide (OZEMPIC) 1 MG/DOSE SOPN Inject 1 mg into the skin once a week. 04/28/18   Olin Hauser, DO    Allergies Patient has no known allergies.  Family History  Problem Relation Age of Onset  . Diabetes Mother   . CAD Mother   . Diabetes Father   . Hyperlipidemia Father     Social History Social History   Tobacco Use  . Smoking status: Never Smoker  . Smokeless tobacco: Never Used  Substance Use Topics  . Alcohol use: No    Comment: rare  . Drug use: No    Review of Systems  Constitutional: Negative for fever. Eyes: Negative for visual changes. ENT: Negative for sore throat. Neck: No neck pain  Cardiovascular: Negative for chest pain. Respiratory: + shortness of breath, cough Gastrointestinal: Negative for abdominal pain or diarrhea. + vomiting Genitourinary: Negative for dysuria. Musculoskeletal: Negative for back pain. Skin: Negative for rash. Neurological: Negative for weakness or numbness. + HA Psych: No SI or HI  ____________________________________________   PHYSICAL EXAM:  VITAL SIGNS: ED Triage Vitals  Enc Vitals Group     BP 10/25/18 0647 127/82     Pulse Rate 10/25/18 0647 78     Resp 10/25/18 0647 (!) 24     Temp 10/25/18 0647 98.2 F (36.8 C)     Temp Source 10/25/18 0647 Oral     SpO2 10/25/18 0647 98 %     Weight 10/25/18 0648 196 lb (88.9 kg)     Height 10/25/18 0648 '5\' 6"'$  (1.676 m)     Head Circumference --      Peak Flow --      Pain Score 10/25/18 0648 7     Pain Loc  --      Pain Edu? --      Excl. in [place]? --     Constitutional: Alert and oriented. Well appearing and in no apparent distress. HEENT:      Head: Normocephalic and atraumatic.         Eyes: Conjunctivae are normal. Sclera is non-icteric.       Mouth/Throat: Mucous membranes are moist.       Neck: Supple with no signs of meningismus. Cardiovascular: Regular rate and rhythm. No murmurs, gallops, or rubs. 2+ symmetrical distal pulses are present in all extremities. No JVD. Respiratory: Mildly increased work of breathing, tachypneic with decreased air movement bilaterally and basilar crackles, patient is hypoxic to 88% on room air Gastrointestinal: Soft, non tender, and non distended with positive bowel sounds. No rebound or guarding. Musculoskeletal: Trace pitting edema bilaterally Neurologic: Normal speech and language. Face is symmetric. Moving all extremities. No gross focal neurologic deficits are appreciated. Skin: Skin is warm, dry and intact. No rash noted. Psychiatric: Mood and affect are normal. Speech and behavior are normal.  ____________________________________________   LABS (all labs ordered are listed, but only abnormal results are displayed)  Labs Reviewed  BASIC METABOLIC PANEL - Abnormal; Notable for the following components:      Result Value   Glucose, Bld 140 (*)    All other components within normal limits  FIBRIN DERIVATIVES D-DIMER ([facility] ONLY) - Abnormal; Notable for the following components:   Fibrin derivatives D-dimer [facility]) 1,376.81 (*)    All other components within normal limits  BRAIN NATRIURETIC PEPTIDE - Abnormal; Notable for the following components:   B Natriuretic Peptide 766.0 (*)    All other components within normal limits  CBC WITH DIFFERENTIAL/PLATELET  TROPONIN I  INFLUENZA PANEL BY PCR (TYPE A & B)   ____________________________________________  EKG  ED ECG REPORT I, Rudene Re, the attending physician, personally viewed and  interpreted this ECG.  Sinus tachycardia, rate of 114, prolonged QTC, normal axis, LVH, frequent PVCs, no ST elevations or depressions. No significant changes when compared to prior although QTc is prolonged today ____________________________________________  RADIOLOGY  I have personally reviewed the images performed during this visit and I agree with the Radiologist's read.   Interpretation by Radiologist:  Dg Chest 2 View  Result Date: 10/25/2018 CLINICAL DATA:  Headache 3 days with difficulty breathing. EXAM: CHEST - 2  VIEW COMPARISON:  02/02/2017 FINDINGS: Lungs are hypoinflated demonstrate bilateral central coarse interstitial changes. Small amount of posterior left pleural fluid demonstrated. Cardiomediastinal silhouette and remainder of the exam is unremarkable. IMPRESSION: Hypoinflation with coarse central interstitial changes suggesting interstitial edema versus acute bronchitic process. Small amount of posterior left pleural fluid. Electronically Signed   By: Marin Olp M.D.   On: 10/25/2018 08:14   Ct Angio Chest Pe W And/or Wo Contrast  Result Date: 10/25/2018 CLINICAL DATA:  Dyspnea on exertion EXAM: CT ANGIOGRAPHY CHEST WITH CONTRAST TECHNIQUE: Multidetector CT imaging of the chest was performed using the standard protocol during bolus administration of intravenous contrast. Multiplanar CT image reconstructions and MIPs were obtained to evaluate the vascular anatomy. CONTRAST:  77m OMNIPAQUE IOHEXOL 350 MG/ML SOLN COMPARISON:  Chest x-ray from earlier in the same day. FINDINGS: Cardiovascular: Thoracic aorta demonstrates mild atherosclerotic calcifications. No aneurysmal dilatation is seen. No cardiac enlargement is noted. Coronary calcifications are seen. The pulmonary artery demonstrates a normal branching pattern without evidence of focal filling defects to suggest pulmonary embolism. Mediastinum/Nodes: Thoracic inlet is within normal limits. No hilar or mediastinal adenopathy is  noted. Esophagus is within normal limits. Small sliding-type hiatal hernia is noted. Lungs/Pleura: Bilateral pleural effusions are noted left greater than right. Bilateral atelectasis/early infiltrate is noted in the lower lobes. Mild changes of interstitial edema are noted throughout both lungs similar to that seen on the prior exam. Upper Abdomen: Postsurgical changes consistent with prior cholecystectomy noted. Hiatal hernia is again seen. Musculoskeletal: Degenerative changes of the thoracic spine are noted. No acute bony abnormality is seen. Review of the MIP images confirms the above findings. IMPRESSION: Changes of CHF with interstitial edema and pleural effusions. Bibasilar atelectasis/early infiltrate. No evidence of pulmonary emboli. Electronically Signed   By: MInez CatalinaM.D.   On: 10/25/2018 11:15      ____________________________________________   PROCEDURES  Procedure(s) performed: None Procedures Critical Care performed: yes  CRITICAL CARE Performed by: CRudene Re ?  Total critical care time: 30 min  Critical care time was exclusive of separately billable procedures and treating other patients.  Critical care was necessary to treat or prevent imminent or life-threatening deterioration.  Critical care was time spent personally by me on the following activities: development of treatment plan with patient and/or surrogate as well as nursing, discussions with consultants, evaluation of patient's response to treatment, examination of patient, obtaining history from patient or surrogate, ordering and performing treatments and interventions, ordering and review of laboratory studies, ordering and review of radiographic studies, pulse oximetry and re-evaluation of patient's condition.  ____________________________________________   INITIAL IMPRESSION / ASSESSMENT AND PLAN / ED COURSE  53y.o. female with a history of anemia, eosinophilia, GERD, diabetes and  hyperlipidemia who presents for evaluation of shortness of breath and cough.  Initially in triage patient was noted to be tachypneic with mildly increased work of breathing but no hypoxia.  She received 2 DuoNebs in triage before being brought to the room.  During my evaluation patient is hypoxic to 88% with decreased air movement bilaterally and bibasilar crackles.  Mild pitting edema bilaterally.  Patient is afebrile with otherwise normal vital signs.  Differential diagnosis including pneumonia versus bronchitis versus PE versus viral illness versus CHF. Last ECHO in 2017 with normal EF. With no fever, diarrhea, sore throat or significant vomiting, flu is less likely.  Patient was placed on 2 L nasal cannula with improvement of her sats to the mid 90s.  We will  give her a third DuoNeb and steroids.  Labs and chest x-ray are pending.  EKG showing no acute ischemic changes.    _________________________ 11:24 AM on 10/25/2018 -----------------------------------------  Work-up consistent with new CHF.  Due to elevated d-dimer patient was sent for a CT angiogram which is negative for PE but positive for pulmonary edema.  Will start patient on Lasix and will admit patient for new oxygen requirement.   As part of my medical decision making, I reviewed the following data within the [place] notes reviewed and incorporated, Labs reviewed , EKG interpreted , Old EKG reviewed, Old chart reviewed, Radiograph reviewed , Discussed with admitting physician , Notes from prior ED visits and [place] Controlled Substance Database    Pertinent labs & imaging results that were available during my care of the patient were reviewed by me and considered in my medical decision making (see chart for details).    ____________________________________________   FINAL CLINICAL IMPRESSION(S) / ED DIAGNOSES  Final diagnoses:  Acute respiratory failure with hypoxia ([place])  New onset of congestive heart  failure ([place])      NEW MEDICATIONS STARTED DURING THIS VISIT:  ED Discharge Orders    None       Note:  This document was prepared using Dragon voice recognition software and may include unintentional dictation errors.    Alfred Levins, [place], MD 10/25/18 1125

## 2018-10-25 NOTE — ED Notes (Signed)
Patient transported to CT 

## 2018-10-26 LAB — BASIC METABOLIC PANEL
Anion gap: 9 (ref 5–15)
BUN: 31 mg/dL — ABNORMAL HIGH (ref 6–20)
CO2: 24 mmol/L (ref 22–32)
Calcium: 8.6 mg/dL — ABNORMAL LOW (ref 8.9–10.3)
Chloride: 108 mmol/L (ref 98–111)
Creatinine, Ser: 1.36 mg/dL — ABNORMAL HIGH (ref 0.44–1.00)
GFR calc Af Amer: 51 mL/min — ABNORMAL LOW (ref >60)
GFR calc non Af Amer: 44 mL/min — ABNORMAL LOW (ref >60)
Glucose, Bld: 194 mg/dL — ABNORMAL HIGH (ref 70–99)
Potassium: 4.5 mmol/L (ref 3.5–5.1)
Sodium: 141 mmol/L (ref 135–145)

## 2018-10-26 LAB — RESPIRATORY PANEL BY PCR
Adenovirus: NOT DETECTED
Bordetella pertussis: NOT DETECTED
Chlamydophila pneumoniae: NOT DETECTED
Coronavirus 229E: NOT DETECTED
Coronavirus HKU1: NOT DETECTED
Coronavirus NL63: NOT DETECTED
Coronavirus OC43: NOT DETECTED
INFLUENZA A-RVPPCR: NOT DETECTED
Influenza B: NOT DETECTED
Metapneumovirus: NOT DETECTED
Mycoplasma pneumoniae: NOT DETECTED
PARAINFLUENZA VIRUS 3-RVPPCR: NOT DETECTED
Parainfluenza Virus 1: NOT DETECTED
Parainfluenza Virus 2: NOT DETECTED
Parainfluenza Virus 4: NOT DETECTED
RHINOVIRUS / ENTEROVIRUS - RVPPCR: NOT DETECTED
Respiratory Syncytial Virus: NOT DETECTED

## 2018-10-26 LAB — CBC
HCT: 32.2 % — ABNORMAL LOW (ref 36.0–46.0)
Hemoglobin: 10.4 g/dL — ABNORMAL LOW (ref 12.0–15.0)
MCH: 28.2 pg (ref 26.0–34.0)
MCHC: 32.3 g/dL (ref 30.0–36.0)
MCV: 87.3 fL (ref 80.0–100.0)
Platelets: 364 10*3/uL (ref 150–400)
RBC: 3.69 MIL/uL — ABNORMAL LOW (ref 3.87–5.11)
RDW: 13.6 % (ref 11.5–15.5)
WBC: 11 10*3/uL — AB (ref 4.0–10.5)
nRBC: 0 % (ref 0.0–0.2)

## 2018-10-26 LAB — GLUCOSE, CAPILLARY
GLUCOSE-CAPILLARY: 240 mg/dL — AB (ref 70–99)
Glucose-Capillary: 105 mg/dL — ABNORMAL HIGH (ref 70–99)
Glucose-Capillary: 77 mg/dL (ref 70–99)
Glucose-Capillary: 168 mg/dL — ABNORMAL HIGH (ref 70–99)

## 2018-10-26 LAB — HIV ANTIBODY (ROUTINE TESTING W REFLEX): HIV SCREEN 4TH GENERATION: NONREACTIVE

## 2018-10-26 LAB — PROCALCITONIN: Procalcitonin: <0.1 ng/mL

## 2018-10-26 MED ORDER — SPIRONOLACTONE 25 MG PO TABS
25.0000 mg | ORAL_TABLET | Freq: Every day | ORAL | Status: DC
  Administered 2018-10-26 – 2018-10-28 (×3): 25 mg via ORAL
  Filled 2018-10-26 (×3): qty 1

## 2018-10-26 NOTE — Progress Notes (Signed)
SUBJECTIVE: Still very short of breath   Vitals:   10/26/18 0403 10/26/18 0811 10/26/18 0813 10/26/18 0955  BP: 109/82 100/72  110/71  Pulse: (!) 109 (!) 106    Resp:  18    Temp: 98.1 F (36.7 C) 98.1 F (36.7 C)    TempSrc: Oral Oral    SpO2: 95% 97% 95%   Weight:      Height:        Intake/Output Summary (Last 24 hours) at 10/26/2018 1114 Last data filed at 10/26/2018 0017 Gross per 24 hour  Intake 100 ml  Output 0 ml  Net 100 ml    LABS: Basic Metabolic Panel: Recent Labs    10/25/18 0653 10/26/18 0433  NA 140 141  K 4.6 4.5  CL 108 108  CO2 24 24  GLUCOSE 140* 194*  BUN 19 31*  CREATININE 1.00 1.36*  CALCIUM 9.2 8.6*   Liver Function Tests: No results for input(s): AST, ALT, ALKPHOS, BILITOT, PROT, ALBUMIN in the last 72 hours. No results for input(s): LIPASE, AMYLASE in the last 72 hours. CBC: Recent Labs    10/25/18 0653 10/26/18 0433  WBC 9.1 11.0*  NEUTROABS 6.0  --   HGB 12.0 10.4*  HCT 37.2 32.2*  MCV 88.2 87.3  PLT 387 364   Cardiac Enzymes: Recent Labs    10/25/18 0653  TROPONINI <0.03   BNP: Invalid input(s): POCBNP D-Dimer: No results for input(s): DDIMER in the last 72 hours. Hemoglobin A1C: Recent Labs    10/25/18 1411  HGBA1C 6.6*   Fasting Lipid Panel: No results for input(s): CHOL, HDL, LDLCALC, TRIG, CHOLHDL, LDLDIRECT in the last 72 hours. Thyroid Function Tests: No results for input(s): TSH, T4TOTAL, T3FREE, THYROIDAB in the last 72 hours.  Invalid input(s): FREET3 Anemia Panel: No results for input(s): VITAMINB12, FOLATE, FERRITIN, TIBC, IRON, RETICCTPCT in the last 72 hours.   PHYSICAL EXAM General: Well developed, well nourished, in no acute distress HEENT:  Normocephalic and atramatic Neck:  No JVD.  Lungs: Clear bilaterally to auscultation and percussion. Heart: HRRR . Normal S1 and S2 without gallops or murmurs.  Abdomen: Bowel sounds are positive, abdomen soft and non-tender  Msk:  Back normal, normal  gait. Normal strength and tone for age. Extremities: No clubbing, cyanosis or edema.   Neuro: Alert and oriented X 3. Psych:  Good affect, responds appropriately  TELEMETRY: Sinus rhythm  ASSESSMENT AND PLAN: Congestive heart failure due to severe left ventricular systolic dysfunction with left ventricular ejection fraction between 9 and 16% and severely dilated left atrium and left ventricle.  Started the patient on Entresto and carvedilol yesterday after stopping lisinopril.  And continue IV Lasix and will do outpatient work-up to rule out coronary artery disease.  Active Problems:   CHF (congestive heart failure) (HCC)    Isai Gottlieb A, MD, [facility] 10/26/2018 11:14 AM

## 2018-10-26 NOTE — Progress Notes (Signed)
Patient placed on Overnight Pulse Ox on Room Air, SAT at 87% RA.

## 2018-10-26 NOTE — Progress Notes (Signed)
Cardiovascular and Pulmonary Nurse Navigator Note:    Sent Dr. Welton Flakes a secure chat message via [facility] regarding patient's need for [facility] given her EF is 16 - 20% per echo performed on  10/25/2018.  Dr. Welton Flakes sent response stating he would ordering of the [employer] Vest would be taken care of in his office.    Attempted to round on patient to review Heart Failure Education, talk about [facility] Heart Failure Clinic, and Cardiac Rehab.  Patient appeared to be sound asleep, as patient did not stir when I stated her name.  Patient lying on right side respirations even and regular.  Will attempt to round on patient again in the morning.    Patient has a new patient appointment to be seen in the [facility] Failure Clinic on 11/05/2018 at 0900.    Updated patient's assigned RN, Shanda Bumps, on the above.   Army Melia, RN, BSN, [facility]  Echo  [facility] Cardiac & Pulmonary Rehab  Cardiovascular & Pulmonary Nurse Navigator  Direct Line: [phone]  Department Phone #: [phone] Fax: [phone]  Email Address: [email or web address]

## 2018-10-26 NOTE — Progress Notes (Signed)
[facility] - [place] [facility]   PATIENT NAME: Patricia [name]    MR#:  [id number]  DATE OF BIRTH:  1966/07/28  SUBJECTIVE:  patient came in with increasing shortness of breath and cough. She was found to have severe cardiomyopathy. EF of 9 to 16% per cardiology. Feels a lot better. Does not wear oxygen at home. Denies any chest pain. Denies any recent illness.  REVIEW OF SYSTEMS:   Review of Systems  Constitutional: Negative for chills, fever and weight loss.  HENT: Negative for ear discharge, ear pain and nosebleeds.   Eyes: Negative for blurred vision, pain and discharge.  Respiratory: Positive for shortness of breath. Negative for sputum production, wheezing and stridor.   Cardiovascular: Positive for orthopnea and PND. Negative for chest pain and palpitations.  Gastrointestinal: Negative for abdominal pain, diarrhea, nausea and vomiting.  Genitourinary: Negative for frequency and urgency.  Musculoskeletal: Negative for back pain and joint pain.  Neurological: Positive for weakness. Negative for sensory change, speech change and focal weakness.  Psychiatric/Behavioral: Negative for depression and hallucinations. The patient is not nervous/anxious.    Tolerating Diet: yes Tolerating PT: not needed  DRUG ALLERGIES:  No Known Allergies  VITALS:  Blood pressure 110/71, pulse (!) 106, temperature 98.1 F (36.7 C), temperature source Oral, resp. rate 18, height 5\' 6"  (1.676 m), weight 88.9 kg, last menstrual period 07/10/2015, SpO2 95 %.  PHYSICAL EXAMINATION:   Physical Exam  GENERAL:  53 y.o.-year-old patient lying in the bed with no acute distress.  EYES: Pupils equal, round, reactive to light and accommodation. No scleral icterus. Extraocular muscles intact.  HEENT: Head atraumatic, normocephalic. Oropharynx and nasopharynx clear.  NECK:  Supple, no jugular venous distention. No thyroid enlargement, no tenderness.  LUNGS: decreased breath sounds  bilaterally, no wheezing, no rales, no rhonchi. No use of accessory muscles of respiration.  CARDIOVASCULAR: S1, S2 normal. No murmurs, rubs, or gallops.  ABDOMEN: Soft, nontender, nondistended. Bowel sounds present. No organomegaly or mass.  EXTREMITIES: No cyanosis, clubbing  -+ edema b/l.    NEUROLOGIC: Cranial nerves II through XII are intact. No focal Motor or sensory deficits b/l.   PSYCHIATRIC:  patient is alert and oriented x 3.  SKIN: No obvious rash, lesion, or ulcer.   LABORATORY PANEL:  CBC Recent Labs  Lab 10/26/18 0433  WBC 11.0*  HGB 10.4*  HCT 32.2*  PLT 364    Chemistries  Recent Labs  Lab 10/26/18 0433  NA 141  K 4.5  CL 108  CO2 24  GLUCOSE 194*  BUN 31*  CREATININE 1.36*  CALCIUM 8.6*   Cardiac Enzymes Recent Labs  Lab 10/25/18 0653  TROPONINI <0.03   RADIOLOGY:  Dg Chest 2 View  Result Date: 10/25/2018 CLINICAL DATA:  Headache 3 days with difficulty breathing. EXAM: CHEST - 2 VIEW COMPARISON:  02/02/2017 FINDINGS: Lungs are hypoinflated demonstrate bilateral central coarse interstitial changes. Small amount of posterior left pleural fluid demonstrated. Cardiomediastinal silhouette and remainder of the exam is unremarkable. IMPRESSION: Hypoinflation with coarse central interstitial changes suggesting interstitial edema versus acute bronchitic process. Small amount of posterior left pleural fluid. Electronically Signed   By: Elberta Fortis M.D.   On: 10/25/2018 08:14   Ct Angio Chest Pe W And/or Wo Contrast  Result Date: 10/25/2018 CLINICAL DATA:  Dyspnea on exertion EXAM: CT ANGIOGRAPHY CHEST WITH CONTRAST TECHNIQUE: Multidetector CT imaging of the chest was performed using the standard protocol during bolus administration of intravenous contrast. Multiplanar CT image  reconstructions and MIPs were obtained to evaluate the vascular anatomy. CONTRAST:  71mL OMNIPAQUE IOHEXOL 350 MG/ML SOLN COMPARISON:  Chest x-ray from earlier in the same day. FINDINGS:  Cardiovascular: Thoracic aorta demonstrates mild atherosclerotic calcifications. No aneurysmal dilatation is seen. No cardiac enlargement is noted. Coronary calcifications are seen. The pulmonary artery demonstrates a normal branching pattern without evidence of focal filling defects to suggest pulmonary embolism. Mediastinum/Nodes: Thoracic inlet is within normal limits. No hilar or mediastinal adenopathy is noted. Esophagus is within normal limits. Small sliding-type hiatal hernia is noted. Lungs/Pleura: Bilateral pleural effusions are noted left greater than right. Bilateral atelectasis/early infiltrate is noted in the lower lobes. Mild changes of interstitial edema are noted throughout both lungs similar to that seen on the prior exam. Upper Abdomen: Postsurgical changes consistent with prior cholecystectomy noted. Hiatal hernia is again seen. Musculoskeletal: Degenerative changes of the thoracic spine are noted. No acute bony abnormality is seen. Review of the MIP images confirms the above findings. IMPRESSION: Changes of CHF with interstitial edema and pleural effusions. Bibasilar atelectasis/early infiltrate. No evidence of pulmonary emboli. Electronically Signed   By: Alcide Clever M.D.   On: 10/25/2018 11:15   ASSESSMENT AND PLAN:  Patricia [name]  is a 53 y.o. female came in with headache and shortness of breath.  She states the headache is been going on for a few days.  She woke up with trouble breathing this morning.  Complains of a little ache in the chest with the cough  1. 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 Lennette Bihari cardiologist-- recommends entresto, Coreg, Lasix -I have added spironolactone -? Life Vest -cardiology wants to do CAD workup as outpatient -monitor input output -no evidence of pneumonia. Discontinue antibiotics -will check overnight pulse oximetry  2. Hypertension continue above  meds  3. Diabetes continue Lantus and sliding scale  4. Depression on Paxil  5. DVT prophylaxis subcu Lovenox  6. hyperlipidemia continue atorvastatin  D/w dtr  CODE STATUS: *full  DVT Prophylaxis: lovenox  TOTAL TIME TAKING CARE OF THIS PATIENT: *30* minutes.  >50% time spent on counselling and coordination of care  POSSIBLE D/C IN *1-2* DAYS, DEPENDING ON CLINICAL CONDITION.  Note: This dictation was prepared with Dragon dictation along with smaller phrase technology. Any transcriptional errors that result from this process are unintentional.  Enedina Finner M.D on 10/26/2018 at 2:30 PM  Between 7am to 6pm - Pager - [phone]  After 6pm go to [email or web address] - Social research officer, government  [facility]  Office  [phone]  CC: Primary care physician; Smitty Cords, DOPatient ID: [name], female   DOB: 27-Jun-1966, 53 y.o.   MRN: [id number]

## 2018-10-27 ENCOUNTER — Encounter: Payer: Self-pay | Admitting: *Deleted

## 2018-10-27 LAB — GLUCOSE, CAPILLARY
GLUCOSE-CAPILLARY: 236 mg/dL — AB (ref 70–99)
Glucose-Capillary: 140 mg/dL — ABNORMAL HIGH (ref 70–99)
Glucose-Capillary: 160 mg/dL — ABNORMAL HIGH (ref 70–99)
Glucose-Capillary: 192 mg/dL — ABNORMAL HIGH (ref 70–99)

## 2018-10-27 LAB — BASIC METABOLIC PANEL
Anion gap: 8 (ref 5–15)
BUN: 28 mg/dL — ABNORMAL HIGH (ref 6–20)
CO2: 27 mmol/L (ref 22–32)
Calcium: 9 mg/dL (ref 8.9–10.3)
Chloride: 102 mmol/L (ref 98–111)
Creatinine, Ser: 1.37 mg/dL — ABNORMAL HIGH (ref 0.44–1.00)
GFR calc Af Amer: 51 mL/min — ABNORMAL LOW (ref >60)
GFR, EST NON AFRICAN AMERICAN: 44 mL/min — AB (ref >60)
Glucose, Bld: 142 mg/dL — ABNORMAL HIGH (ref 70–99)
POTASSIUM: 4.4 mmol/L (ref 3.5–5.1)
Sodium: 137 mmol/L (ref 135–145)

## 2018-10-27 LAB — PROCALCITONIN: Procalcitonin: <0.1 ng/mL

## 2018-10-27 MED ORDER — DIGOXIN 125 MCG PO TABS
0.1250 mg | ORAL_TABLET | Freq: Every day | ORAL | Status: DC
  Administered 2018-10-27 – 2018-10-28 (×2): 0.125 mg via ORAL
  Filled 2018-10-27 (×2): qty 1

## 2018-10-27 MED ORDER — FUROSEMIDE 20 MG PO TABS
20.0000 mg | ORAL_TABLET | Freq: Every day | ORAL | Status: DC
  Administered 2018-10-28: 20 mg via ORAL
  Filled 2018-10-27: qty 1

## 2018-10-27 NOTE — Progress Notes (Signed)
Cardiovascular and Pulmonary Nurse Navigator Note:    53 year old female who presented to the ED with c/o HA and SOB.  PMHx significant for anemia, depression, arthritis, DM Type 2, Dyspnea, GERD, HLD, and HTN.  Patient is never smoker.  Patient admitted with dx of CHF.  Patient was on metoprolol and lisinopril prior to admission.  BNP on admission:  766.  Troponin:  < 0.03.   CXR on admission:  IMPRESSION: Hypoinflation with coarse central interstitial changes suggesting interstitial edema versus acute bronchitic process. Small amount of posterior left pleural fluid.   Echo performed on 10/25/2018 revealed EF of 16-20%.  Down from previous Echo on 12/01/2015 when EF was 60 - 65%.    CHF Education:?? Educational session with patient completed.  Reviewed "Living Better with Heart Failure" packet with patient. Briefly reviewed definition of heart failure and signs and symptoms of an exacerbation.?Explained to patient that HF is a chronic illness which requires self-assessment / self-management along with help from the cardiologist/PCP.  Patient informed this RN that she has watched HF EMMI videos which explained this to her.   ? *Reviewed importance of and reason behind checking weight daily in the AM, after using the bathroom, but before getting dressed. Patient has scale, but is not sure of its accuracy.  Patient thinking about purchasing a new scale.   ? *Reviewed with patient the following information: *Discussed when to call the Dr= weight gain of >2-3lb overnight of 5lb in a week,  *Discussed yellow zone= call MD: weight gain of >2-3lb overnight of 5lb in a week, increased swelling, increased SOB when lying down, chest discomfort, dizziness, increased fatigue *Red Zone= call 911: struggle to breath, fainting or near fainting, significant chest pain   Patient has Heart Failure Zone magnet.    *Diet - Reviewed low sodium diet-provided handout of recommended and not recommended foods.  Dietitian  Consultation for education completed today.  ? *Discussed fluid intake with patient as well. Patient not currently on a fluid restriction, but advised no more than 64 ounces of fluid (all fluid) per day.?Patient acknowledged that she read this in her packet.   ? *Instructed patient to take medications as prescribed for heart failure. Explained briefly why pt is on the medications (either make you feel better, live longer or keep you out of the hospital) and discussed monitoring and side effects. Patient currently on the following Cardiac Medications:  Coreg, Aspirin, Digoxin, Lasix, Entresto, Spironolactone.   ? *Discussed exercise. Encouraged patient to remain as active as possible.  Discussed Cardiac Rehab with patient.  Explained to patient with Heart Failure dx with EF of 16 - 20% she is a candidate for Cardiac Rehab.  .  Informed patient prior to starting any exercise or Cardiac Rehab program she would need to complete further cardiac testing.  Patient acknowledged she needed to have further testing.  Patient then shared that she is terrified of having a cardiac stress test due to the fact that her mother had a heart attack when she had a cardiac stress test.  Patient stated, "I cannot do it.  I will not have a stress test."  This RN asked the patient if she has shared with this Dr. Welton Flakes or Caroleen Hamman, PA-C.  Patient's response, "No I have not."  This RN sent a secure chat message to Caroleen Hamman, PA-C and Dr. Welton Flakes informing them both of the above.  Should discuss Cardiac Rehab with patient after ischemic work-up has been completed.   ? *  Smoking Cessation- Patient is a NEVER smoker.? ? *[facility] - Explained the purpose of the HF Clinic. ?Explained to patient the HF Clinic does not replace PCP nor Cardiologist, but is an additional resource to helping patient manage heart failure at home.  Patient has new patient appointment in the [facility] on 11/05/2018  at 0900.    [facility] - Information about support group provided. Brochure given. Patient invited and encouraged to attend. Next meeting 11/02/2018 at 11:30 a.m.   Again, the 5 Steps to Living Better with Heart Failure were reviewed with patient.  ? Patient thanked me for providing the above information. ? ? Army Melia, RN, BSN, [facility]? [facility]  [facility] Cardiac &?Pulmonary Rehab  Cardiovascular &?Pulmonary Nurse Navigator  Direct Line: [phone]  Department Phone #: [phone] Fax: [phone]?

## 2018-10-27 NOTE — Plan of Care (Signed)
Nutrition Education Note  RD consulted for nutrition education regarding new onset CHF.  Spoke with pt at length at bedside. Pt states that she is feeling slightly better today but occasionally still experiencing some coughing and mild SOB.  Pt reports that she tries to eat healthfully. Pt shares at a medication that she started in Fall 2019 for her diagnosis called Ozempic has caused her to have a reduced appetite. Per pt, it is one of the side effects of the medication.  Pt reports that she typically eats 2 meals daily. Pt states that she does not eat breakfast. For lunch, pt may have cheese and crackers OR soup OR salad OR yogurt with Special K cereal OR ham and cheese rolls, crackers, and fruit. Pt reports that for dinner, she eats small portions of meals like spaghetti and sloppy Joe sandwiches. Pt states that she craves something sweet daily and may eat some dark chocolate or a small cupcake.  Pt shares that she does not like many cooked vegetables and typically eats salad, raw cabbage, and cooked green beans for her vegetables. Pt shares that she has been drinking V-8 juice as she believes it will help her get vitamin and minerals she needs but isn't getting because she does not eat many vegetables. However, pt reports that she just recently looked at the nutrition facts label for a large V-8 juice container which showed that the juice had 900 mg of sodium in one serving.  Pt reports that she does not use much salt but does use it when cooking meat like hamburgers or steak. RD discussed other methods of seasoning meats and directed pt towards that portion of the provided handout. Pt states that she does use onion power and garlic powder instead of the versions that contain sodium.  Pt with questions about fluid. Pt is not currently on a fluid restriction with her diet order. Discussed that MD may add a fluid restriction if it is warranted and discussed what types of foods and beverages are  classified as fluids.  RD provided "Low Sodium Nutrition Therapy" handout from the Academy of Nutrition and Dietetics. Reviewed patient's dietary recall as listed above. Provided specific examples on ways to decrease sodium intake in diet using pt's dietary recall as a basis for discussion. Discouraged intake of processed foods and use of salt shaker. Encouraged fresh fruits and vegetables as well as whole grain sources of carbohydrates to maximize fiber intake.   RD discussed why it is important for patient to adhere to diet recommendations, and emphasized the role of fluids, foods to avoid, and importance of weighing self daily. Teach back method used.  Expect good compliance.  Body mass index is 31.43 kg/m. Pt meets criteria for obesity class I based on current BMI.  Current diet order is Heart Healthy/Carb Modified. No meal completion recorded in chart at this time. Labs and medications reviewed. No further nutrition interventions warranted at this time. RD contact information provided. If additional nutrition issues arise, please re-consult RD.    Earma Reading, MS, RD, LDN Inpatient Clinical Dietitian Pager: [phone] Weekend/After Hours: [phone]

## 2018-10-27 NOTE — Plan of Care (Signed)
Overnight pulse oximetry completed overnight O2 sats did drop in the 80's a few times. But back on 2L this morning Problem: Coping: Goal: Level of anxiety will decrease Outcome: Progressing   Problem: Pain Managment: Goal: General experience of comfort will improve Outcome: Progressing Note:  No complaints of pain this shift   Problem: Cardiac: Goal: Ability to achieve and maintain adequate cardiopulmonary perfusion will improve Outcome: Progressing Note:  Receiving IV lasix   Problem: Education: Goal: Knowledge of General Education information will improve Description Including pain rating scale, medication(s)/side effects and non-pharmacologic comfort measures Outcome: Completed/Met   

## 2018-10-27 NOTE — Progress Notes (Signed)
[facility] - [place] [facility]   PATIENT NAME: Patricia [name]    MR#:  [id number]  DATE OF BIRTH:  [birth year]  SUBJECTIVE:  patient came in with increasing shortness of breath and cough. She was found to have severe cardiomyopathy. EF of 9 to 16% per cardiology. Feels a lot better. Does not wear oxygen at home. Denies any chest pain. Denies any recent illness. Good uop  REVIEW OF SYSTEMS:   Review of Systems  Constitutional: Negative for chills, fever and weight loss.  HENT: Negative for ear discharge, ear pain and nosebleeds.   Eyes: Negative for blurred vision, pain and discharge.  Respiratory: Positive for shortness of breath. Negative for sputum production, wheezing and stridor.   Cardiovascular: Positive for orthopnea and PND. Negative for chest pain and palpitations.  Gastrointestinal: Negative for abdominal pain, diarrhea, nausea and vomiting.  Genitourinary: Negative for frequency and urgency.  Musculoskeletal: Negative for back pain and joint pain.  Neurological: Positive for weakness. Negative for sensory change, speech change and focal weakness.  Psychiatric/Behavioral: Negative for depression and hallucinations. The patient is not nervous/anxious.    Tolerating Diet: yes Tolerating PT: not needed  DRUG ALLERGIES:  No Known Allergies  VITALS:  Blood pressure 90/63, pulse 100, temperature 98.6 F (37 C), resp. rate 20, height 5\' 6"  (1.676 m), weight 88.3 kg, last menstrual period 07/10/2015, SpO2 99 %.  PHYSICAL EXAMINATION:   Physical Exam  GENERAL:  53 y.o.-year-old patient lying in the bed with no acute distress.  EYES: Pupils equal, round, reactive to light and accommodation. No scleral icterus. Extraocular muscles intact.  HEENT: Head atraumatic, normocephalic. Oropharynx and nasopharynx clear.  NECK:  Supple, no jugular venous distention. No thyroid enlargement, no tenderness.  LUNGS: decreased breath sounds bilaterally, no  wheezing, no rales, no rhonchi. No use of accessory muscles of respiration.  CARDIOVASCULAR: S1, S2 normal. No murmurs, rubs, or gallops.  ABDOMEN: Soft, nontender, nondistended. Bowel sounds present. No organomegaly or mass.  EXTREMITIES: No cyanosis, clubbing  -+ edema b/l.    NEUROLOGIC: Cranial nerves II through XII are intact. No focal Motor or sensory deficits b/l.   PSYCHIATRIC:  patient is alert and oriented x 3.  SKIN: No obvious rash, lesion, or ulcer.   LABORATORY PANEL:  CBC Recent Labs  Lab 10/26/18 0433  WBC 11.0*  HGB 10.4*  HCT 32.2*  PLT 364    Chemistries  Recent Labs  Lab 10/27/18 0428  NA 137  K 4.4  CL 102  CO2 27  GLUCOSE 142*  BUN 28*  CREATININE 1.37*  CALCIUM 9.0   Cardiac Enzymes Recent Labs  Lab 10/25/18 0653  TROPONINI <0.03   RADIOLOGY:  No results found. ASSESSMENT AND PLAN:  Patricia [name]  is a 53 y.o. female came in with headache and shortness of breath.  She states the headache is been going on for a few days.  She woke up with trouble breathing this morning.  Complains of a little ache in the chest with the cough  1. 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 Welton Flakes cardiologist-- recommends entresto, Coreg, Lasix, spironolactone -? Life Vest -cardiology wants to do CAD workup as outpatient -monitor input output -no evidence of pneumonia. Discontinue antibiotics -overnight pulse oximetry-- patient decided less than 88% for five hours. She qualifies for oxygen -to oral Lasix from tomorrow.  2. Hypertension continue above meds  3. Diabetes continue Lantus and sliding scale  4. Depression on Paxil  5. DVT prophylaxis subcu Lovenox  6. hyperlipidemia continue atorvastatin  D/w pt   CODE STATUS: *full  DVT Prophylaxis: lovenox  TOTAL TIME TAKING CARE OF THIS PATIENT: *30* minutes.  >50% time spent on counselling and coordination  of care  POSSIBLE D/C IN *1-2* DAYS, DEPENDING ON CLINICAL CONDITION.  Note: This dictation was prepared with Dragon dictation along with smaller phrase technology. Any transcriptional errors that result from this process are unintentional.  Enedina Finner M.D on 10/27/2018 at 2:15 PM  Between 7am to 6pm - Pager - [phone]  After 6pm go to [email or web address] - Social research officer, government  [facility]  Office  [phone]  CC: Primary care physician; Smitty Cords, DOPatient ID: Patricia [name], female   DOB: 05/11/1966, 53 y.o.   MRN: [id number]

## 2018-10-27 NOTE — Progress Notes (Addendum)
SUBJECTIVE: Feeling short of breath.    Vitals:   10/26/18 2112 10/27/18 0330 10/27/18 0805 10/27/18 0818  BP:  110/79  108/77  Pulse:  (!) 103  100  Resp:  18  20  Temp:  98.4 F (36.9 C)  98.6 F (37 C)  TempSrc:  Oral    SpO2: 95% 96% 99% 99%  Weight:  88.3 kg    Height:        Intake/Output Summary (Last 24 hours) at 10/27/2018 0840 Last data filed at 10/27/2018 0330 Gross per 24 hour  Intake 102.7 ml  Output 3200 ml  Net -3097.3 ml    LABS: Basic Metabolic Panel: Recent Labs    10/26/18 0433 10/27/18 0428  NA 141 137  K 4.5 4.4  CL 108 102  CO2 24 27  GLUCOSE 194* 142*  BUN 31* 28*  CREATININE 1.36* 1.37*  CALCIUM 8.6* 9.0   Liver Function Tests: No results for input(s): AST, ALT, ALKPHOS, BILITOT, PROT, ALBUMIN in the last 72 hours. No results for input(s): LIPASE, AMYLASE in the last 72 hours. CBC: Recent Labs    10/25/18 0653 10/26/18 0433  WBC 9.1 11.0*  NEUTROABS 6.0  --   HGB 12.0 10.4*  HCT 37.2 32.2*  MCV 88.2 87.3  PLT 387 364   Cardiac Enzymes: Recent Labs    10/25/18 0653  TROPONINI <0.03   BNP: Invalid input(s): POCBNP D-Dimer: No results for input(s): DDIMER in the last 72 hours. Hemoglobin A1C: Recent Labs    10/25/18 1411  HGBA1C 6.6*   Fasting Lipid Panel: No results for input(s): CHOL, HDL, LDLCALC, TRIG, CHOLHDL, LDLDIRECT in the last 72 hours. Thyroid Function Tests: No results for input(s): TSH, T4TOTAL, T3FREE, THYROIDAB in the last 72 hours.  Invalid input(s): FREET3 Anemia Panel: No results for input(s): VITAMINB12, FOLATE, FERRITIN, TIBC, IRON, RETICCTPCT in the last 72 hours.   PHYSICAL EXAM General: Well developed, well nourished, in no acute distress HEENT:  Normocephalic and atramatic Neck:  No JVD.  Lungs: Clear bilaterally to auscultation and percussion. Heart: HRRR . Normal S1 and S2 without gallops or murmurs.  Abdomen: Bowel sounds are positive, abdomen soft and non-tender  Msk:  Back normal,  normal gait. Normal strength and tone for age. Extremities: No clubbing, cyanosis or edema.   Neuro: Alert and oriented X 3. Psych:  Good affect, responds appropriately  TELEMETRY: sinus tachycardia 103bpm  ASSESSMENT AND PLAN:  Acute on chronic systolic CHF: EF 96-43% with diffuse hypokineses. Continue Entresto, aldactone, and coreg. Will discontinue metoprolol due to low-normal blood pressure.  Add digoxin for rate control, heart remains elevated.  Advise one more day of IV lasix and wean oxygen off, likely able to discharge home tomorrow with outpatient follow up with Dr. Welton Flakes.   Active Problems:   CHF (congestive heart failure) (HCC)    Patricia Hamman, NP-C 10/27/2018 8:40 AM Cell: [phone]

## 2018-10-28 LAB — GLUCOSE, CAPILLARY
Glucose-Capillary: 234 mg/dL — ABNORMAL HIGH (ref 70–99)
Glucose-Capillary: 163 mg/dL — ABNORMAL HIGH (ref 70–99)

## 2018-10-28 MED ORDER — CARVEDILOL 6.25 MG PO TABS: 6.2500 mg | ORAL_TABLET | Freq: Two times a day (BID) | ORAL | 3 refills | Status: DC

## 2018-10-28 MED ORDER — CARVEDILOL 6.25 MG PO TABS: 6.2500 mg | ORAL_TABLET | Freq: Two times a day (BID) | ORAL | Status: DC

## 2018-10-28 MED ORDER — SPIRONOLACTONE 25 MG PO TABS: 25.0000 mg | ORAL_TABLET | Freq: Every day | ORAL | 3 refills | Status: AC

## 2018-10-28 MED ORDER — FUROSEMIDE 20 MG PO TABS: 20.0000 mg | ORAL_TABLET | Freq: Every day | ORAL | 3 refills | Status: AC

## 2018-10-28 MED ORDER — SACUBITRIL-VALSARTAN 24-26 MG PO TABS: 1.0000 | ORAL_TABLET | Freq: Two times a day (BID) | ORAL | 3 refills | Status: AC

## 2018-10-28 MED ORDER — CARVEDILOL 3.125 MG PO TABS: 3.1250 mg | ORAL_TABLET | Freq: Two times a day (BID) | ORAL | 3 refills | Status: DC

## 2018-10-28 MED ORDER — DIGOXIN 125 MCG PO TABS: 0.1250 mg | ORAL_TABLET | Freq: Every day | ORAL | 3 refills | Status: AC

## 2018-10-28 NOTE — Discharge Instructions (Signed)
Wear oxygen at night regularly and during daytime as needed

## 2018-10-28 NOTE — Discharge Summary (Addendum)
[facility] NAME: Patricia [name]    MR#:  [id number]  DATE OF BIRTH:  09/18/1965  DATE OF ADMISSION:  10/25/2018 ADMITTING PHYSICIAN: Loletha Grayer, MD  DATE OF DISCHARGE: 10/28/2018  PRIMARY CARE PHYSICIAN: Olin Hauser, DO    ADMISSION DIAGNOSIS:  Acute respiratory failure with hypoxia ([name]) [J96.01] New onset of congestive heart failure ([name]) [I50.9]  DISCHARGE DIAGNOSIS:   * Acute respiratory failure with hypoxia secondary to acute systolic congestive heart failure * Severe cardiomyopathy-- EF of 9 to 16%-- to rule out ischemic cause to be done as outpatient by cardiology SECONDARY DIAGNOSIS:   Past Medical History:  Diagnosis Date  . Anemia   . Arthritis   . Complication of anesthesia 12/13/2015   aspiration after breathing tube removed after several surgeries  . Diabetes mellitus without complication ([place])   . Dyspnea   . GERD (gastroesophageal reflux disease)   . Hyperlipidemia   . Hypertension   . Infection 2012   right knee infection, required arthroscopy, long term antibiotic treatment, aspiration therapy    HOSPITAL COURSE:   Patricia [name] with headache and shortness of breath. She states the headache is been going on for a few days. She woke up with trouble breathing this morning. Complains of a little ache [name] the chest with the cough  1. Acute congestive heart failure systolic with severe cardiomyopathy EF of 10 to 16% per echo - echo [name] 2017 had normal EF -seen by Dr. Humphrey Rolls cardiologist-- recommends entresto, Coreg, Lasix, spironolactone -? Life Vest -cardiology wants to do CAD workup as outpatient -patient had good urine output -overnight pulse oximetry-- patient de-sated less than 88% for five hours. She qualifies for oxygen -switch to oral Lasix from today  2. Hypertension continue above meds -submit on the softer side however patient tolerating  meds.  3. Diabetes continue Lantus and sliding scale  4. Depression on Paxil  5. DVT prophylaxis subcu Lovenox  6. hyperlipidemia continue atorvastatin  D/w pt   discharge patient to home. Okay from cardiology standpoint. She will follow up outpatient with Dr. Yancey Flemings for further workup. Patient already has appointment with heart failure clinic on November 05, 2018 CODE STATUS: *full CONSULTS OBTAINED:  Treatment Team:  Dionisio David, MD [facility], Pete Pelt, MD  DRUG ALLERGIES:  No Known Allergies  DISCHARGE MEDICATIONS:   Allergies as of 10/28/2018   No Known Allergies     Medication List    STOP taking these medications   ibuprofen 400 MG tablet Commonly known as:  ADVIL,MOTRIN   lisinopril 20 MG tablet Commonly known as:  PRINIVIL,ZESTRIL   metFORMIN 1000 MG tablet Commonly known as:  GLUCOPHAGE   metoprolol tartrate 25 MG tablet Commonly known as:  LOPRESSOR     TAKE these medications   aspirin EC 81 MG tablet Take 81 mg by mouth daily.   BASAGLAR KWIKPEN 100 UNIT/ML Sopn Inject 0.2 mLs (20 Units total) into the skin daily.   blood glucose meter kit and supplies Kit Dispense based on patient and insurance preference. Use up to four times daily as directed. (FOR ICD-9 250.00, 250.01).   carvedilol 6.25 MG tablet Commonly known as:  COREG Take 1 tablet (6.25 mg total) by mouth 2 (two) times daily with a meal.   digoxin 0.125 MG tablet Commonly known as:  LANOXIN Take 1 tablet (0.125 mg total) by mouth daily.   ferrous sulfate 325 (65 FE) MG tablet Take  325 mg by mouth daily.   FLUoxetine 40 MG capsule Commonly known as:  PROZAC Take 1 capsule (40 mg total) by mouth daily.   furosemide 20 MG tablet Commonly known as:  LASIX Take 1 tablet (20 mg total) by mouth daily.   glucose blood test strip Commonly known as:  ONE TOUCH ULTRA TEST USE TO CHECK BLOOD SUGAR UP TO 3 TIMES DAILY AS DIRECTED   Insulin Pen Needle 31G X 6 MM Misc Commonly known  as:  RELION PEN NEEDLES 1 each by Does not apply route daily. Use with [place] insulin as directed.   loratadine 10 MG tablet Commonly known as:  CLARITIN Take 1 tablet (10 mg total) by mouth daily.   omeprazole 20 MG capsule Commonly known as:  PRILOSEC Take 1 capsule (20 mg total) by mouth 2 (two) times daily before a meal.   ONETOUCH DELICA LANCETS 70V Misc USE TO CHECK BLOOD GLUCOSE UP TO FOUR TIMES DAILY AS DIRECTED What changed:  See the new instructions.   sacubitril-valsartan 24-26 MG Commonly known as:  ENTRESTO Take 1 tablet by mouth 2 (two) times daily.   Semaglutide (1 MG/DOSE) 2 MG/1.5ML Sopn Commonly known as:  OZEMPIC (1 MG/DOSE) Inject 1 mg into the skin once a week.   spironolactone 25 MG tablet Commonly known as:  ALDACTONE Take 1 tablet (25 mg total) by mouth daily.   TYLENOL 8 HOUR ARTHRITIS PAIN 650 MG CR tablet Generic drug:  acetaminophen Take 650 mg by mouth every 8 (eight) hours as needed for pain.       If you experience worsening of your admission symptoms, develop shortness of breath, life threatening emergency, suicidal or homicidal thoughts you must seek medical attention immediately by calling 911 or calling your MD immediately  if symptoms less severe.  You Must read complete instructions/literature along with all the possible adverse reactions/side effects for all the Medicines you take and that have been prescribed to you. Take any new Medicines after you have completely understood and accept all the possible adverse reactions/side effects.   Please note  You were cared for by a hospitalist during your hospital stay. If you have any questions about your discharge medications or the care you received while you were [name] the hospital after you are discharged, you can call the unit and asked to speak with the hospitalist on call if the hospitalist that took care of you is not available. Once you are discharged, your primary care physician will  handle any further medical issues. Please note that NO REFILLS for any discharge medications will be authorized once you are discharged, as it is imperative that you return to your primary care physician (or establish a relationship with a primary care physician if you do not have one) for your aftercare needs so that they can reassess your need for medications and monitor your lab values. Today   SUBJECTIVE  No new complaints   VITAL SIGNS:  Blood pressure 117/72, pulse (!) 111, temperature 98.3 F (36.8 C), temperature source Oral, resp. rate 20, height '5\' 6"'$  (1.676 m), weight 88.1 kg, last menstrual period 07/10/2015, SpO2 97 %.  I/O:    Intake/Output Summary (Last 24 hours) at 10/28/2018 0854 Last data filed at 10/28/2018 0129 Gross per 24 hour  Intake -  Output 1850 ml  Net -1850 ml    PHYSICAL EXAMINATION:  GENERAL:  53 y.o.-year-old patient lying [name] the bed with no acute distress.  EYES: Pupils equal, round, reactive to  light and accommodation. No scleral icterus. Extraocular muscles intact.  HEENT: Head atraumatic, normocephalic. Oropharynx and nasopharynx clear.  NECK:  Supple, no jugular venous distention. No thyroid enlargement, no tenderness.  LUNGS: Normal breath sounds bilaterally, no wheezing, rales,rhonchi or crepitation. No use of accessory muscles of respiration.  CARDIOVASCULAR: S1, S2 normal. No murmurs, rubs, or gallops.  ABDOMEN: Soft, non-tender, non-distended. Bowel sounds present. No organomegaly or mass.  EXTREMITIES: No pedal edema, cyanosis, or clubbing.  NEUROLOGIC: Cranial nerves II through XII are intact. Muscle strength 5/5 [name] all extremities. Sensation intact. Gait not checked.  PSYCHIATRIC: The patient is alert and oriented x 3.  SKIN: No obvious rash, lesion, or ulcer.   DATA REVIEW:   CBC  Recent Labs  Lab 10/26/18 0433  WBC 11.0*  HGB 10.4*  HCT 32.2*  PLT 364    Chemistries  Recent Labs  Lab 10/27/18 0428  NA 137  K 4.4  CL 102   CO2 27  GLUCOSE 142*  BUN 28*  CREATININE 1.37*  CALCIUM 9.0    Microbiology Results   Recent Results (from the past 240 hour(s))  Respiratory Panel by PCR     Status: None   Collection Time: 10/26/18 10:00 AM  Result Value Ref Range Status   Adenovirus NOT DETECTED NOT DETECTED Final   Coronavirus 229E NOT DETECTED NOT DETECTED Final    Comment: (NOTE) The Coronavirus on the Respiratory Panel, DOES NOT test for the novel  Coronavirus (2019 nCoV)    Coronavirus HKU1 NOT DETECTED NOT DETECTED Final   Coronavirus NL63 NOT DETECTED NOT DETECTED Final   Coronavirus OC43 NOT DETECTED NOT DETECTED Final   Metapneumovirus NOT DETECTED NOT DETECTED Final   Rhinovirus / Enterovirus NOT DETECTED NOT DETECTED Final   Influenza A NOT DETECTED NOT DETECTED Final   Influenza B NOT DETECTED NOT DETECTED Final   Parainfluenza Virus 1 NOT DETECTED NOT DETECTED Final   Parainfluenza Virus 2 NOT DETECTED NOT DETECTED Final   Parainfluenza Virus 3 NOT DETECTED NOT DETECTED Final   Parainfluenza Virus 4 NOT DETECTED NOT DETECTED Final   Respiratory Syncytial Virus NOT DETECTED NOT DETECTED Final   Bordetella pertussis NOT DETECTED NOT DETECTED Final   Chlamydophila pneumoniae NOT DETECTED NOT DETECTED Final   Mycoplasma pneumoniae NOT DETECTED NOT DETECTED Final    Comment: Performed at [facility], [place]. [address] [place], [place] [address]    RADIOLOGY:  No results found.   CODE STATUS:     Code Status Orders  (From admission, onward)         Start     Ordered   10/25/18 1214  Full code  Continuous     10/25/18 1213        Code Status History    Date Active Date Inactive Code Status Order ID Comments User Context   02/02/2017 1443 02/06/2017 1749 Full Code [id number]  Hillary Bow, MD ED   08/08/2016 1447 08/11/2016 2154 Full Code [id number]  Hessie Knows, MD Inpatient      TOTAL TIME TAKING CARE OF THIS PATIENT: *40* minutes.    Fritzi Mandes M.D on 10/28/2018 at  8:54 AM  Between 7am to 6pm - Pager - [phone] After 6pm go to [email or web address] - password [email or web address] [facility]  Office  [phone]  CC: Primary care physician; Olin Hauser, DO

## 2018-10-28 NOTE — Progress Notes (Signed)
Pt to be discharged this afternoon. Iv and tele removed. disch instructions prescrips and home 02 provided. disch via w.c. accompan ied by daughter.

## 2018-10-28 NOTE — Progress Notes (Signed)
SUBJECTIVE: Shortness of breath improving.    Vitals:   10/27/18 1933 10/27/18 2034 10/28/18 0542 10/28/18 0816  BP: 108/78  (!) 90/55 117/72  Pulse: (!) 109  85 (!) 111  Resp: 20     Temp: 98.4 F (36.9 C)  98.3 F (36.8 C)   TempSrc: Oral  Oral   SpO2: 95% 95% 91% 97%  Weight:   88.1 kg   Height:        Intake/Output Summary (Last 24 hours) at 10/28/2018 0853 Last data filed at 10/28/2018 0129 Gross per 24 hour  Intake -  Output 1850 ml  Net -1850 ml    LABS: Basic Metabolic Panel: Recent Labs    10/26/18 0433 10/27/18 0428  NA 141 137  K 4.5 4.4  CL 108 102  CO2 24 27  GLUCOSE 194* 142*  BUN 31* 28*  CREATININE 1.36* 1.37*  CALCIUM 8.6* 9.0   Liver Function Tests: No results for input(s): AST, ALT, ALKPHOS, BILITOT, PROT, ALBUMIN in the last 72 hours. No results for input(s): LIPASE, AMYLASE in the last 72 hours. CBC: Recent Labs    10/26/18 0433  WBC 11.0*  HGB 10.4*  HCT 32.2*  MCV 87.3  PLT 364   Cardiac Enzymes: No results for input(s): CKTOTAL, CKMB, CKMBINDEX, TROPONINI in the last 72 hours. BNP: Invalid input(s): POCBNP D-Dimer: No results for input(s): DDIMER in the last 72 hours. Hemoglobin A1C: Recent Labs    10/25/18 1411  HGBA1C 6.6*   Fasting Lipid Panel: No results for input(s): CHOL, HDL, LDLCALC, TRIG, CHOLHDL, LDLDIRECT in the last 72 hours. Thyroid Function Tests: No results for input(s): TSH, T4TOTAL, T3FREE, THYROIDAB in the last 72 hours.  Invalid input(s): FREET3 Anemia Panel: No results for input(s): VITAMINB12, FOLATE, FERRITIN, TIBC, IRON, RETICCTPCT in the last 72 hours.   PHYSICAL EXAM General: Well developed, well nourished, in no acute distress HEENT:  Normocephalic and atramatic Neck:  No JVD.  Lungs: Clear bilaterally to auscultation and percussion. Heart: HRRR . Normal S1 and S2 without gallops or murmurs.  Abdomen: Bowel sounds are positive, abdomen soft and non-tender  Msk:  Back normal, normal gait.  Normal strength and tone for age. Extremities: No clubbing, cyanosis or edema.   Neuro: Alert and oriented X 3. Psych:  Good affect, responds appropriately  TELEMETRY: Sinus tachycardia 110bpm  ASSESSMENT AND PLAN: Acute on chronic systolic CHF: EF 33-43%.  Continue Entresto, coreg, aldactone, and digoxin. Increased coreg to 6.25mg  BID. Wean daytime oxygen as tolerated. May discharge today and follow up outpatient with Dr. Adrian Blackwater, Monday 10am 11/01/18.  Active Problems:   CHF (congestive heart failure) (HCC)    Caroleen Hamman, NP-C 10/28/2018 8:53 AM Cell: [phone]

## 2018-10-28 NOTE — Plan of Care (Signed)
  Problem: Pain Managment: Goal: General experience of comfort will improve Outcome: Progressing Note:  No complaints of pain this shift   Problem: Cardiac: Goal: Ability to achieve and maintain adequate cardiopulmonary perfusion will improve Outcome: Progressing   Problem: Coping: Goal: Level of anxiety will decrease Outcome: Completed/Met

## 2018-10-29 ENCOUNTER — Telehealth: Payer: Self-pay

## 2018-10-29 NOTE — Telephone Encounter (Signed)
Transition Care Management Follow-up Telephone Call  Date of discharge and from where: [facility] on 10/28/18.  How have you been since you were released from the hospital? Doing ok, wearing oxygen at night (2L). Pt is not sleeping well at night and has not had an appetite but pt states this has been going on for awhile. Declines SOB, lightheadness, pain, fever or n/v/d.  Any questions or concerns? No   Items Reviewed:  Did the pt receive and understand the discharge instructions provided? Yes   Medications obtained and verified? Declined reviewing medications at this time.   Any new allergies since your discharge? No   Dietary orders reviewed? Yes  Do you have support at home? Yes   Other (ie: DME, Home Health, etc) Yes- d/c with Oxygen to use nightly (2L).  Functional Questionnaire: (I = Independent and D = Dependent  Bathing/Dressing- I   Meal Prep- I  Eating- I  Maintaining continence- I  Transferring/Ambulation- I  Managing Meds- I   Follow up appointments reviewed:    PCP Hospital f/u appt confirmed? Yes  Scheduled to see Dr Althea Charon on 11/03/18 @ 2:20 PM.  Specialist Hospital f/u appt confirmed? Yes    Are transportation arrangements needed? No   If their condition worsens, is the pt aware to call  their PCP or go to the ED? Yes  Was the patient provided with contact information for the PCP's office or ED? Yes  Was the pt encouraged to call back with questions or concerns? Yes

## 2018-10-29 NOTE — Telephone Encounter (Signed)
Called pt to complete TCM call and pt stated that she was getting a call from the office at that moment. Advised her I would CB later to complete call. Pt stated understanding. -MM

## 2018-11-02 ENCOUNTER — Other Ambulatory Visit: Payer: Self-pay | Admitting: Family Medicine

## 2018-11-02 DIAGNOSIS — E1142 Type 2 diabetes mellitus with diabetic polyneuropathy: Secondary | ICD-10-CM

## 2018-11-02 DIAGNOSIS — Z794 Long term (current) use of insulin: Principal | ICD-10-CM

## 2018-11-03 ENCOUNTER — Other Ambulatory Visit: Payer: Self-pay

## 2018-11-03 ENCOUNTER — Ambulatory Visit: Payer: BC Managed Care – PPO | Admitting: Family Medicine

## 2018-11-03 ENCOUNTER — Encounter: Payer: Self-pay | Admitting: Family Medicine

## 2018-11-03 VITALS — BP 108/53 | HR 51 | Temp 98.4°F | Resp 16 | Ht 66.0 in | Wt 189.0 lb

## 2018-11-03 DIAGNOSIS — I5023 Acute on chronic systolic (congestive) heart failure: Secondary | ICD-10-CM

## 2018-11-03 DIAGNOSIS — I429 Cardiomyopathy, unspecified: Secondary | ICD-10-CM

## 2018-11-03 NOTE — Patient Instructions (Addendum)
Thank you for coming to the office today.  Keep on current schedule with Dr Welton Flakes Cardiology - on Friday  Heart rate is improved  It seems like doing well with regards to no more swelling, lungs are clear, no other signs of worsening fluid changes.  Keep up the good work with the low sodium diet  Remain OFF Metformin for now - due to risk with heart failure interaction, not safe early on, but in future we may revisit once heart has improved.  For now continue Basaglar insulin and Ozempic. Ordered basaglar yesterday Tuesday  Continue oxygen as you are - the Cardiology team may make recommendations to change this, or we can consult a Pulmonology in future if still needing this longer than few weeks to months.  May take time to recover heart function, I do not think permanent disability is warranted.  If sudden weight gain and swelling - short of breath, need to be seen for evaluation of heart failure or can go to hospital.  Please schedule a Follow-up Appointment to: Return in about 3 months (around 02/03/2019) for Diabetes A1c, Heart Failure Cards updates.  If you have any other questions or concerns, please feel free to call the office or send a message through MyChart. You may also schedule an earlier appointment if necessary.  Additionally, you may be receiving a survey about your experience at our office within a few days to 1 week by e-mail or mail. We value your feedback.  Saralyn Pilar, DO [facility], [place]

## 2018-11-03 NOTE — Progress Notes (Signed)
Subjective:    Patient ID: Patricia [name], female    DOB: 04/05/1966, 53 y.o.   MRN: [id number]  Patricia [name] is a 53 y.o. 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 Brief summary of recent course, patient had symptoms of shortness of breath worsening and with PND, orthopnea, and edema, hospitalized with new dx acute CHF systolic dysfunction, had x-ray, had CT that was negative for PE or focal pneumonia, also treated for COPD exac, treated with IV Lasix, had cardiology consultation by Dr Humphrey Rolls and ruled out MI, had ECHO that demonstrated significantly reduced LVEF 10-16% based on last, and prior in 2017 was normal.  - Today reports overall has done well after discharge. Symptoms of dyspnea have mostly resolved. Her edema is improved as well. - She has followed with Dr Neoma Laming on 11/01/18, who saw her in hospital, has been started on medication management with Delene Loll, Coreg, Lasix and Spironolactone, and will follow back up with her promptly by end of week on Friday 3/13, further testing was required, and may warrant her seeing [facility]. - It was recommended to have cardiac stress testing to determine cause of her reduced systolic function, she has fear of exercise stress test treadmill due to family history of event that her mother failed a stress test and was hospitalized and ultimately had a MI. - She has used oxygen as  well, on discharge qualified for overnight oxygen due to documented desat < 88% for 5 hours, she is using PRN day and at night  - New medications on discharge: Entrestro, Lasix, Spironolactone - Changes to current med: HOLD Metformin, she asked Dr Humphrey Rolls about this as well  Appetite has improved, she discussed certain foods that she was eating before that are high in sodium. She keeps a detailed log now of diet and sodium content.   I have reviewed the discharge medication list, and have reconciled the current and discharge medications today.   Current Outpatient Medications:  .  acetaminophen (TYLENOL 8 HOUR ARTHRITIS PAIN) 650 MG CR tablet, Take 650 mg by mouth every 8 (eight) hours as needed for pain., Disp: , Rfl:  .  aspirin EC 81 MG tablet, Take 81 mg by mouth daily. , Disp: , Rfl:  .  blood glucose meter kit and supplies KIT, Dispense based on patient and insurance preference. Use up to four times daily as directed. (FOR ICD-9 250.00, 250.01)., Disp: 1 each, Rfl: 0 .  carvedilol (COREG) 6.25 MG tablet, Take 1 tablet (6.25 mg total) by mouth 2 (two) times daily with a meal., Disp: 60 tablet, Rfl: 3 .  digoxin (LANOXIN) 0.125 MG tablet, Take 1 tablet (0.125 mg total) by mouth daily., Disp: 30 tablet, Rfl: 3 .  FLUoxetine (PROZAC) 40 MG capsule, Take 1 capsule (40 mg total) by mouth daily., Disp: 90 capsule, Rfl: 3 .  furosemide (LASIX) 20 MG tablet, Take 1 tablet (20 mg total) by mouth daily., Disp: 30 tablet, Rfl: 3 .  glucose blood (ONE TOUCH ULTRA TEST)  test strip, USE TO CHECK BLOOD SUGAR UP TO 3 TIMES DAILY AS DIRECTED, Disp: 300 each, Rfl: 3 .  Insulin Glargine (BASAGLAR KWIKPEN) 100 UNIT/ML SOPN, INJECT 20 UNITS SUBCUTANEOUSLY DAILY, Disp: 15 mL, Rfl: 1 .  Insulin Pen Needle (RELION PEN NEEDLES) 31G X 6 MM MISC, 1 each by Does not apply route daily. Use with Toujeo insulin as directed., Disp: 100 each, Rfl: 11 .  loratadine (CLARITIN) 10 MG tablet, Take 1 tablet (10 mg total) by mouth  daily., Disp: 90 tablet, Rfl: 3 .  omeprazole (PRILOSEC) 20 MG capsule, Take 1 capsule (20 mg total) by mouth 2 (two) times daily before a meal., Disp: 180 capsule, Rfl: 3 .  ONETOUCH DELICA LANCETS 21Y MISC, USE TO CHECK BLOOD GLUCOSE UP TO FOUR TIMES DAILY AS DIRECTED (Patient taking differently: Inject 1 Device into the skin 4 (four) times daily. ), Disp: 100 each, Rfl: 0 .  sacubitril-valsartan (ENTRESTO) 24-26 MG, Take 1 tablet by mouth 2 (two) times daily., Disp: 60 tablet, Rfl: 3 .  Semaglutide (OZEMPIC) 1 MG/DOSE SOPN, Inject 1 mg into the skin once a week., Disp: 2 pen, Rfl: 11 .  spironolactone (ALDACTONE) 25 MG tablet, Take 1 tablet (25 mg total) by mouth daily., Disp: 30 tablet, Rfl: 3 .  ferrous sulfate 325 (65 FE) MG tablet, Take 325 mg by mouth daily., Disp: , Rfl:   ------------------------------------------------------------------------- Social History   Tobacco Use  . Smoking status: Never Smoker  . Smokeless tobacco: Never Used  Substance Use Topics  . Alcohol use: No    Comment: rare  . Drug use: No    Review of Systems Per HPI unless specifically indicated above     Objective:    BP (!) 108/53   Pulse (!) 51   Temp 98.4 F (36.9 C) (Oral)   Resp 16   Ht _0  (1.676 m)   Wt 189 lb (85.7 kg)   LMP 07/10/2015 (Approximate)   SpO2 98%   BMI 30.51 kg/m   Wt Readings from Last 3 Encounters:  11/03/18 189 lb (85.7 kg)  10/28/18 194 lb 4.8 oz (88.1 kg)  04/28/18 193 lb 9.6 oz (87.8 kg)    Physical Exam Vitals signs and nursing note reviewed.  Constitutional:      General: She is not in acute distress.    Appearance: She is well-developed. She is not diaphoretic.     Comments: Well-appearing, comfortable, cooperative, obese  HENT:     Head: Normocephalic and atraumatic.  Eyes:     General:        Right eye: No discharge.        Left eye: No discharge.     Conjunctiva/sclera: Conjunctivae normal.  Neck:     Musculoskeletal: Normal range of motion  and neck supple.     Thyroid: No thyromegaly.  Cardiovascular:     Rate and Rhythm: Normal rate and regular rhythm.     Heart sounds: Normal heart sounds. No murmur.  Pulmonary:     Effort: Pulmonary effort is normal. No respiratory distress.     Breath sounds: Normal breath sounds. No wheezing or rales.     Comments: Good air movement. No focal abnormality or crackles or coarse sounds. Musculoskeletal: Normal range of motion.     Right lower leg: No edema.     Left lower leg: No edema.     Comments: Improved edema LE, now only trace if any.  Lymphadenopathy:     Cervical: No  cervical adenopathy.  Skin:    General: Skin is warm and dry.     Findings: No erythema or rash.  Neurological:     Mental Status: She is alert and oriented to person, place, and time.  Psychiatric:        Behavior: Behavior normal.     Comments: Well groomed, good eye contact, normal speech and thoughts      Recent Labs    12/02/17 0911 04/28/18 1156 10/25/18 1411  HGBA1C 7.1* 6.7* 6.6*   I have personally reviewed the results report from ECHOcardiogram on 10/25/18 - read by Dr Humphrey Rolls.  ECHOCARDIOGRAM REPORT       Patient Name:   NEGIN HEGG Date of Exam: 10/25/2018 Medical Rec #:  [id number]         Height:       66.0 in Accession #:    [id number]        Weight:       196.0 lb Date of Birth:  03-17-1966          BSA:          1.98 m Patient Age:    34 years          BP:           120/74 mmHg Patient Gender: F                 HR:           110 bpm. Exam Location:  [facility]    Procedure: 2D Echo, Cardiac Doppler and Color Doppler  Indications:     CHF 428.0   History:         Patient has prior history of Echocardiogram examinations, most                  recent 12/01/2015. Signs/Symptoms: Dyspnea; Risk Factors:                  Hypertension and Diabetes. Anemia, GERD, hyperlipidemia.   Sonographer:     Sherrie Sport RDCS (AE) Referring Phys:  [id number] Loletha Grayer Diagnosing Phys: Neoma Laming  MD    Sonographer Comments: No parasternal window. IMPRESSIONS    1. The left ventricle has severely reduced systolic function, with an ejection fraction of 16-20%. The cavity size was severely dilated. Left ventricular diastolic Doppler parameters are consistent with pseudonormalization Left ventricular diffuse  hypokinesis.  2. The right ventricle has normal systolic function. The cavity was normal. There is no increase in right ventricular wall thickness.  3. The mitral valve is normal in structure. Mitral valve regurgitation is mild to moderate by color flow Doppler.  4. The tricuspid valve is normal in structure.  5. The aortic valve is tricuspid Mild thickening of the aortic valve.  6. The pulmonic valve was normal in structure.  FINDINGS  Left Ventricle: The left ventricle has severely reduced systolic function, with an ejection fraction of 20-25%. The cavity size was severely dilated. There is no increase in left ventricular wall thickness. Left ventricular diastolic Doppler parameters  are consistent with pseudonormalization Left ventricular diffuse hypokinesis. Right Ventricle: The right ventricle has normal systolic function. The cavity was normal. There is no increase in right ventricular wall thickness. Left Atrium: left atrial size was normal in size Right Atrium: right atrial size was normal in size. Right atrial pressure is estimated at 10 mmHg. Interatrial Septum: No atrial level shunt detected by color flow Doppler. Pericardium: There is no evidence of  pericardial effusion. Mitral Valve: The mitral valve is normal in structure. Mitral valve regurgitation is mild to moderate by color flow Doppler. Tricuspid Valve: The tricuspid valve is normal in structure. Tricuspid valve regurgitation was not visualized by color flow Doppler. Aortic Valve: The aortic valve is tricuspid Mild thickening of the aortic valve. Aortic valve regurgitation was not visualized by color flow  Doppler. Pulmonic Valve: The pulmonic valve was normal in structure. Pulmonic valve regurgitation is not visualized by color flow Doppler. Venous: The inferior vena cava is normal in size with greater than 50% respiratory variability. Compared to previous exam: LVEF 16%.   LEFT VENTRICLE PLAX 2D (Teich)              Biplane EF (MOD) LV EF:          16.6 %       LV Biplane EF:   8.9 % LVIDd:          5.79 cm      LV A4C EF:       5.9 % LVIDs:          5.35 cm      LV A2C EF:       7.7 % LV PW:          1.03 cm LV IVS:         0.66 cm      Diastology LVOT diam:      2.00 cm      LV e' lateral:   6.85 cm/s LV SV:          28 ml        LV E/e' lateral: 9.8 LVOT Area:      3.14 cm     LV e' medial:    3.37 cm/s                              LV E/e' medial:  20.0 LV Volumes (MOD) LV area d, A2C:    38.27 cm LV area d, A4C:    36.70 cm LV area s, A2C:    35.80 cm LV area s, A4C:    35.40 cm LV major d, A2C:   8.50 cm LV major d, A4C:   8.15 cm LV major s, A2C:   8.33 cm LV major s, A4C:   8.44 cm LV vol d, MOD A2C: 143.0 ml LV vol d, MOD A4C: 135.0 ml LV vol s, MOD A2C: 132.0 ml LV vol s, MOD A4C: 127.0 ml LV SV MOD A2C:     11.0 ml LV SV MOD A4C:     135.0 ml LV SV MOD BP:      12.6 ml  RIGHT VENTRICLE RV Basal diam:  2.48 cm RVSP:           51.5 mmHg  LEFT ATRIUM             Index       RIGHT ATRIUM           Index LA diam:        5.20 cm 2.62 cm/m  RA Pressure: 10 mmHg LA Vol (A2C):   85.8 ml 43.27 ml/m RA Area:     10.20 cm LA Vol (A4C):   93.5 ml 47.16 ml/m RA Volume:   18.00 ml  9.08 ml/m LA Biplane Vol: 90.6 ml 45.70 ml/m  AORTIC VALVE AV Area (Vmax):    1.67 cm AV  Area (Vmean):   1.69 cm AV Area (VTI):     2.15 cm AV Vmax:           112.67 cm/s AV Vmean:          76.700 cm/s AV VTI:            0.193 m AV Peak Grad:      5.1 mmHg AV Mean Grad:      3.0 mmHg LVOT Vmax:         59.90 cm/s LVOT Vmean:        41.300 cm/s LVOT VTI:          0.132 m  LVOT/AV VTI ratio: 0.68  MITRAL VALVE               TRICUSPID VALVE MV Area (PHT): 5.54 cm    TR Peak grad:   41.5 mmHg MV PHT:        39.73 msec  TR Vmax:        322.00 cm/s MV Decel Time: 137 msec    RVSP:           51.5 mmHg MV E velocity: 67.40 cm/s MV A velocity: 118.00 cm/s MV E/A ratio:  0.57    Neoma Laming MD Electronically signed by Neoma Laming MD Signature Date/Time: 10/25/2018/4:03:47 PM  Results for orders placed or performed during the hospital encounter of 10/25/18  Respiratory Panel by PCR  Result Value Ref Range   Adenovirus NOT DETECTED NOT DETECTED   Coronavirus 229E NOT DETECTED NOT DETECTED   Coronavirus HKU1 NOT DETECTED NOT DETECTED   Coronavirus NL63 NOT DETECTED NOT DETECTED   Coronavirus OC43 NOT DETECTED NOT DETECTED   Metapneumovirus NOT DETECTED NOT DETECTED   Rhinovirus / Enterovirus NOT DETECTED NOT DETECTED   Influenza A NOT DETECTED NOT DETECTED   Influenza B NOT DETECTED NOT DETECTED   Parainfluenza Virus 1 NOT DETECTED NOT DETECTED   Parainfluenza Virus 2 NOT DETECTED NOT DETECTED   Parainfluenza Virus 3 NOT DETECTED NOT DETECTED   Parainfluenza Virus 4 NOT DETECTED NOT DETECTED   Respiratory Syncytial Virus NOT DETECTED NOT DETECTED   Bordetella pertussis NOT DETECTED NOT DETECTED   Chlamydophila pneumoniae NOT DETECTED NOT DETECTED   Mycoplasma pneumoniae NOT DETECTED NOT DETECTED  CBC with Differential  Result Value Ref Range   WBC 9.1 4.0 - 10.5 K/uL   RBC 4.22 3.87 - 5.11 MIL/uL   Hemoglobin 12.0 12.0 - 15.0 g/dL   HCT 37.2 36.0 - 46.0 %   MCV 88.2 80.0 - 100.0 fL   MCH 28.4 26.0 - 34.0 pg   MCHC 32.3 30.0 - 36.0 g/dL   RDW 13.7 11.5 - 15.5 %   Platelets 387 150 - 400 K/uL   nRBC 0.0 0.0 - 0.2 %   Neutrophils Relative % 66 %   Neutro Abs 6.0 1.7 - 7.7 K/uL   Lymphocytes Relative 23 %   Lymphs Abs 2.1 0.7 - 4.0 K/uL   Monocytes Relative 5 %   Monocytes Absolute 0.4 0.1 - 1.0 K/uL   Eosinophils Relative 5 %   Eosinophils  Absolute 0.4 0.0 - 0.5 K/uL   Basophils Relative 1 %   Basophils Absolute 0.1 0.0 - 0.1 K/uL   Immature Granulocytes 0 %   Abs Immature Granulocytes 0.04 0.00 - 0.07 K/uL  Basic metabolic panel  Result Value Ref Range   Sodium 140 135 - 145 mmol/L   Potassium 4.6 3.5 - 5.1 mmol/L   Chloride  108 98 - 111 mmol/L   CO2 24 22 - 32 mmol/L   Glucose, Bld 140 (H) 70 - 99 mg/dL   BUN 19 6 - 20 mg/dL   Creatinine, Ser 1.00 0.44 - 1.00 mg/dL   Calcium 9.2 8.9 - 10.3 mg/dL   GFR calc non Af Amer >60 >60 mL/min   GFR calc Af Amer >60 >60 mL/min   Anion gap 8 5 - 15  Troponin I - Add-On to previous collection  Result Value Ref Range   Troponin I <0.03 <0.03 ng/mL  Influenza panel by PCR (type A & B)  Result Value Ref Range   Influenza A By PCR NEGATIVE NEGATIVE   Influenza B By PCR NEGATIVE NEGATIVE  Fibrin derivatives D-Dimer ([facility] only)  Result Value Ref Range   Fibrin derivatives D-dimer ([facility]) 1,376.81 (H) 0.00 - 499.00 ng/mL (FEU)  Brain natriuretic peptide  Result Value Ref Range   B Natriuretic Peptide 766.0 (H) 0.0 - 100.0 pg/mL  Procalcitonin - Baseline  Result Value Ref Range   Procalcitonin <0.10 ng/mL  Hemoglobin A1c  Result Value Ref Range   Hgb A1c MFr Bld 6.6 (H) 4.8 - 5.6 %   Mean Plasma Glucose 142.72 mg/dL  HIV antibody (Routine Testing)  Result Value Ref Range   HIV Screen 4th Generation wRfx Non Reactive Non Reactive  Procalcitonin  Result Value Ref Range   Procalcitonin <0.10 ng/mL  Basic metabolic panel  Result Value Ref Range   Sodium 141 135 - 145 mmol/L   Potassium 4.5 3.5 - 5.1 mmol/L   Chloride 108 98 - 111 mmol/L   CO2 24 22 - 32 mmol/L   Glucose, Bld 194 (H) 70 - 99 mg/dL   BUN 31 (H) 6 - 20 mg/dL   Creatinine, Ser 1.36 (H) 0.44 - 1.00 mg/dL   Calcium 8.6 (L) 8.9 - 10.3 mg/dL   GFR calc non Af Amer 44 (L) >60 mL/min   GFR calc Af Amer 51 (L) >60 mL/min   Anion gap 9 5 - 15  CBC  Result Value Ref Range   WBC 11.0 (H) 4.0 - 10.5 K/uL   RBC  3.69 (L) 3.87 - 5.11 MIL/uL   Hemoglobin 10.4 (L) 12.0 - 15.0 g/dL   HCT 32.2 (L) 36.0 - 46.0 %   MCV 87.3 80.0 - 100.0 fL   MCH 28.2 26.0 - 34.0 pg   MCHC 32.3 30.0 - 36.0 g/dL   RDW 13.6 11.5 - 15.5 %   Platelets 364 150 - 400 K/uL   nRBC 0.0 0.0 - 0.2 %  Glucose, capillary  Result Value Ref Range   Glucose-Capillary 266 (H) 70 - 99 mg/dL  Glucose, capillary  Result Value Ref Range   Glucose-Capillary 229 (H) 70 - 99 mg/dL  Glucose, capillary  Result Value Ref Range   Glucose-Capillary 105 (H) 70 - 99 mg/dL  Glucose, capillary  Result Value Ref Range   Glucose-Capillary 240 (H) 70 - 99 mg/dL  Glucose, capillary  Result Value Ref Range   Glucose-Capillary 77 70 - 99 mg/dL  Procalcitonin  Result Value Ref Range   Procalcitonin <0.10 ng/mL  Basic metabolic panel  Result Value Ref Range   Sodium 137 135 - 145 mmol/L   Potassium 4.4 3.5 - 5.1 mmol/L   Chloride 102 98 - 111 mmol/L   CO2 27 22 - 32 mmol/L   Glucose, Bld 142 (H) 70 - 99 mg/dL   BUN 28 (H) 6 - 20 mg/dL  Creatinine, Ser 1.37 (H) 0.44 - 1.00 mg/dL   Calcium 9.0 8.9 - 10.3 mg/dL   GFR calc non Af Amer 44 (L) >60 mL/min   GFR calc Af Amer 51 (L) >60 mL/min   Anion gap 8 5 - 15  Glucose, capillary  Result Value Ref Range   Glucose-Capillary 168 (H) 70 - 99 mg/dL  Glucose, capillary  Result Value Ref Range   Glucose-Capillary 140 (H) 70 - 99 mg/dL  Glucose, capillary  Result Value Ref Range   Glucose-Capillary 160 (H) 70 - 99 mg/dL  Glucose, capillary  Result Value Ref Range   Glucose-Capillary 192 (H) 70 - 99 mg/dL  Glucose, capillary  Result Value Ref Range   Glucose-Capillary 236 (H) 70 - 99 mg/dL   Comment 1 Notify RN   Glucose, capillary  Result Value Ref Range   Glucose-Capillary 234 (H) 70 - 99 mg/dL   Comment 1 Notify RN   Glucose, capillary  Result Value Ref Range   Glucose-Capillary 163 (H) 70 - 99 mg/dL   Comment 1 Notify RN   ECHOCARDIOGRAM COMPLETE  Result Value Ref Range   Weight  3,136 oz   Height 66 in   BP 120/74 mmHg      Assessment & Plan:   Problem List Items Addressed This Visit    None    Visit Diagnoses    Systolic CHF, acute on chronic (HCC)    -  Primary   Cardiomyopathy, unspecified type ([place])         Clinically significant improvement after hospitalization for Acute on newly dx Chronic CHF, systolic with reduced EF. Uncertain etiology at this time for cardiomyopathy, pending further ischemic cardiac work-up - Currently on med management for CHF  Plan Proceed with Dr Humphrey Rolls cardiology follow-up 3/13 for further work-up, anticipate future ischemic testing and future ECHO monitoring, I advised her that it looks like apt with CHF [facility] was cancelled, she may reschedule once her hospital follow-up with Dr Humphrey Rolls is completed  Continue current med management per Cardiology  REMAIN OFF Metformin, give acute CHF recently. Will DISCONTINUE metformin for now, she may continue on insulin and Ozempic GLP only for now, future reconsider if indicated, last A1c was controlled 10/25/18. =- Already re ordered basaglar  - Keep improving cardiac diet, low sodium and fluid restriction as advised on discharge  Follow-up with cardiology as planned. Strict return criteria if concerning weight gain or edema or other symptoms present, when to contact help or seek care at hospital.  Recent Labs    12/02/17 0911 04/28/18 1156 10/25/18 1411  HGBA1C 7.1* 6.7* 6.6*     No orders of the defined types were placed in this encounter.   Follow up plan: Return in about 3 months (around 02/03/2019) for Diabetes A1c, Heart Failure Cards updates.   Nobie Putnam, [facility] 11/03/2018, 2:32 PM

## 2018-11-04 ENCOUNTER — Encounter: Payer: Self-pay | Admitting: Family Medicine

## 2018-11-05 ENCOUNTER — Ambulatory Visit: Payer: BC Managed Care – PPO | Admitting: Family

## 2018-11-08 ENCOUNTER — Ambulatory Visit: Payer: Self-pay | Admitting: Cardiovascular Disease

## 2018-11-08 DIAGNOSIS — I2510 Atherosclerotic heart disease of native coronary artery without angina pectoris: Secondary | ICD-10-CM | POA: Insufficient documentation

## 2018-11-11 ENCOUNTER — Other Ambulatory Visit: Payer: Self-pay

## 2018-11-11 ENCOUNTER — Encounter
Admission: RE | Disposition: A | Discharge status: Home / Self Care | Payer: Self-pay | Source: Ambulatory Visit | Attending: Cardiovascular Disease

## 2018-11-11 ENCOUNTER — Ambulatory Visit
Admission: RE | Admit: 2018-11-11 | Discharge: 2018-11-11 | Disposition: A | Discharge status: Home / Self Care | Payer: BC Managed Care – PPO | Source: Ambulatory Visit | Attending: Cardiovascular Disease | Admitting: Cardiovascular Disease

## 2018-11-11 DIAGNOSIS — Z7982 Long term (current) use of aspirin: Secondary | ICD-10-CM | POA: Diagnosis not present

## 2018-11-11 DIAGNOSIS — Z79899 Other long term (current) drug therapy: Secondary | ICD-10-CM | POA: Insufficient documentation

## 2018-11-11 DIAGNOSIS — I5022 Chronic systolic (congestive) heart failure: Secondary | ICD-10-CM | POA: Diagnosis not present

## 2018-11-11 DIAGNOSIS — I2510 Atherosclerotic heart disease of native coronary artery without angina pectoris: Secondary | ICD-10-CM | POA: Diagnosis not present

## 2018-11-11 DIAGNOSIS — I249 Acute ischemic heart disease, unspecified: Secondary | ICD-10-CM | POA: Insufficient documentation

## 2018-11-11 DIAGNOSIS — E785 Hyperlipidemia, unspecified: Secondary | ICD-10-CM | POA: Insufficient documentation

## 2018-11-11 DIAGNOSIS — E119 Type 2 diabetes mellitus without complications: Secondary | ICD-10-CM | POA: Insufficient documentation

## 2018-11-11 DIAGNOSIS — Z794 Long term (current) use of insulin: Secondary | ICD-10-CM | POA: Insufficient documentation

## 2018-11-11 DIAGNOSIS — I110 Hypertensive heart disease with heart failure: Secondary | ICD-10-CM | POA: Diagnosis not present

## 2018-11-11 HISTORY — PX: LEFT HEART CATH AND CORONARY ANGIOGRAPHY: CATH118249

## 2018-11-11 LAB — BASIC METABOLIC PANEL
ANION GAP: 8 (ref 5–15)
BUN: 25 mg/dL — ABNORMAL HIGH (ref 6–20)
CO2: 26 mmol/L (ref 22–32)
Calcium: 9.2 mg/dL (ref 8.9–10.3)
Chloride: 102 mmol/L (ref 98–111)
Creatinine, Ser: 1.38 mg/dL — ABNORMAL HIGH (ref 0.44–1.00)
GFR calc Af Amer: 50 mL/min — ABNORMAL LOW (ref >60)
GFR calc non Af Amer: 43 mL/min — ABNORMAL LOW (ref >60)
Glucose, Bld: 288 mg/dL — ABNORMAL HIGH (ref 70–99)
POTASSIUM: 4.3 mmol/L (ref 3.5–5.1)
Sodium: 136 mmol/L (ref 135–145)

## 2018-11-11 LAB — PROTIME-INR
INR: 0.9 (ref 0.8–1.2)
Prothrombin Time: 12.3 seconds (ref 11.4–15.2)

## 2018-11-11 LAB — GLUCOSE, CAPILLARY
GLUCOSE-CAPILLARY: 210 mg/dL — AB (ref 70–99)
Glucose-Capillary: 265 mg/dL — ABNORMAL HIGH (ref 70–99)

## 2018-11-11 LAB — CBC
HCT: 36.4 % (ref 36.0–46.0)
Hemoglobin: 11.8 g/dL — ABNORMAL LOW (ref 12.0–15.0)
MCH: 28 pg (ref 26.0–34.0)
MCHC: 32.4 g/dL (ref 30.0–36.0)
MCV: 86.5 fL (ref 80.0–100.0)
Platelets: 448 10*3/uL — ABNORMAL HIGH (ref 150–400)
RBC: 4.21 MIL/uL (ref 3.87–5.11)
RDW: 13.2 % (ref 11.5–15.5)
WBC: 9.2 10*3/uL (ref 4.0–10.5)
nRBC: 0 % (ref 0.0–0.2)

## 2018-11-11 SURGERY — LEFT HEART CATH AND CORONARY ANGIOGRAPHY
Anesthesia: Moderate Sedation | Laterality: Right

## 2018-11-11 MED ORDER — IOHEXOL 300 MG/ML  SOLN
INTRAMUSCULAR | Status: DC | PRN
  Administered 2018-11-11: 10 mL via INTRA_ARTERIAL

## 2018-11-11 MED ORDER — IOPAMIDOL (ISOVUE-300) INJECTION 61%
INTRAVENOUS | Status: DC | PRN
  Administered 2018-11-11: 30 mL via INTRA_ARTERIAL

## 2018-11-11 MED ORDER — FENTANYL CITRATE (PF) 100 MCG/2ML IJ SOLN
INTRAMUSCULAR | Status: DC | PRN
  Administered 2018-11-11 (×2): 25 ug via INTRAVENOUS

## 2018-11-11 MED ORDER — SODIUM CHLORIDE 0.9 % IV SOLN: 250.0000 mL | INTRAVENOUS | Status: DC | PRN

## 2018-11-11 MED ORDER — HEPARIN SODIUM (PORCINE) 1000 UNIT/ML IJ SOLN
INTRAMUSCULAR | Status: DC | PRN
  Administered 2018-11-11: 4000 [IU] via INTRAVENOUS

## 2018-11-11 MED ORDER — HEPARIN SODIUM (PORCINE) 1000 UNIT/ML IJ SOLN
INTRAMUSCULAR | Status: AC
  Filled 2018-11-11: qty 1

## 2018-11-11 MED ORDER — ONDANSETRON HCL 4 MG/2ML IJ SOLN: 4.0000 mg | Freq: Four times a day (QID) | INTRAMUSCULAR | Status: DC | PRN

## 2018-11-11 MED ORDER — MIDAZOLAM HCL 2 MG/2ML IJ SOLN
INTRAMUSCULAR | Status: AC
  Filled 2018-11-11: qty 2

## 2018-11-11 MED ORDER — SODIUM BICARBONATE 8.4 % IV SOLN
INTRAVENOUS | Status: AC
  Administered 2018-11-11: 13:00:00 via INTRAVENOUS
  Filled 2018-11-11: qty 850

## 2018-11-11 MED ORDER — SODIUM CHLORIDE 0.9% FLUSH: 3.0000 mL | Freq: Two times a day (BID) | INTRAVENOUS | Status: DC

## 2018-11-11 MED ORDER — SODIUM CHLORIDE 0.9 % WEIGHT BASED INFUSION
3.0000 mL/kg/h | INTRAVENOUS | Status: AC
  Administered 2018-11-11: 3 mL/kg/h via INTRAVENOUS

## 2018-11-11 MED ORDER — VERAPAMIL HCL 2.5 MG/ML IV SOLN
INTRAVENOUS | Status: AC
  Filled 2018-11-11: qty 2

## 2018-11-11 MED ORDER — MIDAZOLAM HCL 2 MG/2ML IJ SOLN
INTRAMUSCULAR | Status: DC | PRN
  Administered 2018-11-11 (×2): 0.5 mg via INTRAVENOUS

## 2018-11-11 MED ORDER — FENTANYL CITRATE (PF) 100 MCG/2ML IJ SOLN
INTRAMUSCULAR | Status: AC
  Filled 2018-11-11: qty 2

## 2018-11-11 MED ORDER — VERAPAMIL HCL 2.5 MG/ML IV SOLN
INTRAVENOUS | Status: DC | PRN
  Administered 2018-11-11 (×2): 2.5 mg via INTRA_ARTERIAL

## 2018-11-11 MED ORDER — FENTANYL CITRATE (PF) 100 MCG/2ML IJ SOLN
INTRAMUSCULAR | Status: DC | PRN
  Administered 2018-11-11: 50 ug via INTRAVENOUS

## 2018-11-11 MED ORDER — HEPARIN (PORCINE) IN NACL 1000-0.9 UT/500ML-% IV SOLN
INTRAVENOUS | Status: AC
  Filled 2018-11-11: qty 1000

## 2018-11-11 MED ORDER — SODIUM CHLORIDE 0.9% FLUSH: 3.0000 mL | INTRAVENOUS | Status: DC | PRN

## 2018-11-11 MED ORDER — SODIUM BICARBONATE BOLUS VIA INFUSION
INTRAVENOUS | Status: DC
  Filled 2018-11-11: qty 1

## 2018-11-11 MED ORDER — ASPIRIN 81 MG PO CHEW: 81.0000 mg | CHEWABLE_TABLET | ORAL | Status: DC

## 2018-11-11 MED ORDER — SODIUM CHLORIDE 0.9 % WEIGHT BASED INFUSION: 1.0000 mL/kg/h | INTRAVENOUS | Status: DC

## 2018-11-11 MED ORDER — ACETAMINOPHEN 325 MG PO TABS: 650.0000 mg | ORAL_TABLET | ORAL | Status: DC | PRN

## 2018-11-11 MED ORDER — MIDAZOLAM HCL 2 MG/2ML IJ SOLN
INTRAMUSCULAR | Status: DC | PRN
  Administered 2018-11-11: 1 mg via INTRAVENOUS

## 2018-11-11 MED ORDER — IOPAMIDOL (ISOVUE-300) INJECTION 61%
INTRAVENOUS | Status: DC | PRN
  Administered 2018-11-11: 170 mL via INTRA_ARTERIAL

## 2018-11-11 MED ORDER — VERAPAMIL HCL 2.5 MG/ML IV SOLN
INTRAVENOUS | Status: DC | PRN
  Administered 2018-11-11: 2.5 mg via INTRA_ARTERIAL

## 2018-11-11 MED ORDER — HEPARIN (PORCINE) IN NACL 1000-0.9 UT/500ML-% IV SOLN
INTRAVENOUS | Status: DC | PRN
  Administered 2018-11-11: 1000 mL

## 2018-11-11 SURGICAL SUPPLY — 15 items
CATH 5F 110X4 TIG (CATHETERS) ×3 IMPLANT
CATH INFINITI 5 FR 3DRC (CATHETERS) ×3 IMPLANT
CATH INFINITI 5 FR JL3.5 (CATHETERS) ×3 IMPLANT
CATH INFINITI 5 FR MPA2 (CATHETERS) ×3 IMPLANT
CATH INFINITI 5FR JL4 (CATHETERS) ×3 IMPLANT
CATH INFINITI JR4 5F (CATHETERS) ×3 IMPLANT
DEVICE CLOSURE MYNXGRIP 5F (Vascular Products) ×3 IMPLANT
DEVICE RAD TR BAND REGULAR (VASCULAR PRODUCTS) ×3 IMPLANT
GLIDESHEATH SLEND SS 6F .021 (SHEATH) ×3 IMPLANT
KIT MANI 3VAL PERCEP (MISCELLANEOUS) ×3 IMPLANT
NEEDLE PERC 18GX7CM (NEEDLE) ×3 IMPLANT
PACK CARDIAC CATH (CUSTOM PROCEDURE TRAY) ×3 IMPLANT
SHEATH AVANTI 5FR X 11CM (SHEATH) ×3 IMPLANT
WIRE GUIDERIGHT .035X150 (WIRE) ×3 IMPLANT
WIRE ROSEN-J .035X260CM (WIRE) ×3 IMPLANT

## 2018-11-11 NOTE — Progress Notes (Signed)
Per the orders patient was to go home post cath recovery. Report from cath lab had indicated that the patient would be considered for CTS. Dr. Welton Flakes was called to clarify. Per Dr. Welton Flakes the "patient is medical management and will go home." Dr. Welton Flakes also relayed this information to the daughter. Dr. Juliann Pares was called to ensure that his impression and recommendation was relayed to Dr. Welton Flakes. Dr. Juliann Pares stated that he would call Dr. Welton Flakes again to discuss the patient. Post discussion of the two physicians, Dr. Welton Flakes called the daughter to clarify that the patient would be seen first thing tomorrow to discuss stenting versus CTS. Dr. Welton Flakes stated that he has informed the CTS group at [facility] of this patient and her case. Films will be requested from our cath lab. Per Dr. Welton Flakes he feels comfortable discharging the patient and seeing her tomorrow am. The patient was informed by myself that should she experience any cardiac symptoms in the mean time to call 911 immediately.

## 2018-11-11 NOTE — Discharge Instructions (Signed)
Angiogram, Care After °This sheet gives you information about how to care for yourself after your procedure. Your doctor may also give you more specific instructions. If you have problems or questions, contact your doctor. °Follow these instructions at home: °Insertion site care °· Follow instructions from your doctor about how to take care of your long, thin tube (catheter) insertion area. Make sure you: °? Wash your hands with soap and water before you change your bandage (dressing). If you cannot use soap and water, use hand sanitizer. °? Change your bandage as told by your doctor. °? Leave stitches (sutures), skin glue, or skin tape (adhesive) strips in place. They may need to stay in place for 2 weeks or longer. If tape strips get loose and curl up, you may trim the loose edges. Do not remove tape strips completely unless your doctor says it is okay. °· Do not take baths, swim, or use a hot tub until your doctor says it is okay. °· You may shower 24-48 hours after the procedure or as told by your doctor. °? Gently wash the area with plain soap and water. °? Pat the area dry with a clean towel. °? Do not rub the area. This may cause bleeding. °· Do not apply powder or lotion to the area. Keep the area clean and dry. °· Check your insertion area every day for signs of infection. Check for: °? More redness, swelling, or pain. °? Fluid or blood. °? Warmth. °? Pus or a bad smell. °Activity °· Rest as told by your doctor, usually for 1-2 days. °· Do not lift anything that is heavier than 10 lbs. (4.5 kg) or as told by your doctor. °· Do not drive for 24 hours if you were given a medicine to help you relax (sedative). °· Do not drive or use heavy machinery while taking prescription pain medicine. °General instructions ° °· Go back to your normal activities as told by your doctor, usually in about a week. Ask your doctor what activities are safe for you. °· If the insertion area starts to bleed, lie flat and put  pressure on the area. If the bleeding does not stop, get help right away. This is an emergency. °· Drink enough fluid to keep your pee (urine) clear or pale yellow. °· Take over-the-counter and prescription medicines only as told by your doctor. °· Keep all follow-up visits as told by your doctor. This is important. °Contact a doctor if: °· You have a fever. °· You have chills. °· You have more redness, swelling, or pain around your insertion area. °· You have fluid or blood coming from your insertion area. °· The insertion area feels warm to the touch. °· You have pus or a bad smell coming from your insertion area. °· You have more bruising around the insertion area. °· Blood collects in the tissue around the insertion area (hematoma) that may be painful to the touch. °Get help right away if: °· You have a lot of pain in the insertion area. °· The insertion area swells very fast. °· The insertion area is bleeding, and the bleeding does not stop after holding steady pressure on the area. °· The area near or just beyond the insertion area becomes pale, cool, tingly, or numb. °These symptoms may be an emergency. Do not wait to see if the symptoms will go away. Get medical help right away. Call your local emergency services (911 in the [place] Do not drive yourself to the hospital. °  Summary °· After the procedure, it is common to have bruising and tenderness at the long, thin tube insertion area. °· After the procedure, it is important to rest and drink plenty of fluids. °· Do not take baths, swim, or use a hot tub until your doctor says it is okay to do so. You may shower 24-48 hours after the procedure or as told by your doctor. °· If the insertion area starts to bleed, lie flat and put pressure on the area. If the bleeding does not stop, get help right away. This is an emergency. °This information is not intended to replace advice given to you by your health care provider. Make sure you discuss any questions you have  with your health care provider. °Document Released: 11/07/2008 Document Revised: 08/05/2016 Document Reviewed: 08/05/2016 °[employer] Interactive Patient Education © 2019 [employer] ° ° ° °Moderate Conscious Sedation, Adult, Care After °These instructions provide you with information about caring for yourself after your procedure. Your health care provider may also give you more specific instructions. Your treatment has been planned according to current medical practices, but problems sometimes occur. Call your health care provider if you have any problems or questions after your procedure. °What can I expect after the procedure? °After your procedure, it is common: °· To feel sleepy for several hours. °· To feel clumsy and have poor balance for several hours. °· To have poor judgment for several hours. °· To vomit if you eat too soon. °Follow these instructions at home: °For at least 24 hours after the procedure: ° °· Do not: °? Participate in activities where you could fall or become injured. °? Drive. °? Use heavy machinery. °? Drink alcohol. °? Take sleeping pills or medicines that cause drowsiness. °? Make important decisions or sign legal documents. °? Take care of children on your own. °· Rest. °Eating and drinking °· Follow the diet recommended by your health care provider. °· If you vomit: °? Drink water, juice, or soup when you can drink without vomiting. °? Make sure you have little or no nausea before eating solid foods. °General instructions °· Have a responsible adult stay with you until you are awake and alert. °· Take over-the-counter and prescription medicines only as told by your health care provider. °· If you smoke, do not smoke without supervision. °· Keep all follow-up visits as told by your health care provider. This is important. °Contact a health care provider if: °· You keep feeling nauseous or you keep vomiting. °· You feel light-headed. °· You develop a rash. °· You have a fever. °Get  help right away if: °· You have trouble breathing. °This information is not intended to replace advice given to you by your health care provider. Make sure you discuss any questions you have with your health care provider. °Document Released: 06/01/2013 Document Revised: 01/14/2016 Document Reviewed: 12/01/2015 °[employer] Interactive Patient Education © 2019 [employer] ° °

## 2018-11-12 ENCOUNTER — Encounter: Payer: Self-pay | Admitting: Cardiovascular Disease

## 2018-11-26 ENCOUNTER — Other Ambulatory Visit: Payer: Self-pay | Admitting: *Deleted

## 2018-12-01 NOTE — Addendum Note (Signed)
Addended by: Smitty Cords on: 12/01/2018 08:42 AM   Modules accepted: Level of Service

## 2018-12-19 ENCOUNTER — Other Ambulatory Visit: Payer: Self-pay | Admitting: Family Medicine

## 2018-12-19 DIAGNOSIS — E1142 Type 2 diabetes mellitus with diabetic polyneuropathy: Secondary | ICD-10-CM

## 2018-12-19 DIAGNOSIS — F419 Anxiety disorder, unspecified: Secondary | ICD-10-CM

## 2018-12-19 DIAGNOSIS — Z794 Long term (current) use of insulin: Principal | ICD-10-CM

## 2018-12-19 DIAGNOSIS — K219 Gastro-esophageal reflux disease without esophagitis: Secondary | ICD-10-CM

## 2018-12-21 ENCOUNTER — Encounter: Payer: Self-pay | Admitting: Family Medicine

## 2018-12-21 ENCOUNTER — Other Ambulatory Visit: Payer: Self-pay

## 2018-12-21 ENCOUNTER — Ambulatory Visit (INDEPENDENT_AMBULATORY_CARE_PROVIDER_SITE_OTHER): Payer: BC Managed Care – PPO | Admitting: Family Medicine

## 2018-12-21 VITALS — BP 112/73 | Ht 66.0 in | Wt 187.0 lb

## 2018-12-21 DIAGNOSIS — I255 Ischemic cardiomyopathy: Secondary | ICD-10-CM | POA: Diagnosis not present

## 2018-12-21 DIAGNOSIS — I25118 Atherosclerotic heart disease of native coronary artery with other forms of angina pectoris: Secondary | ICD-10-CM

## 2018-12-21 DIAGNOSIS — E1140 Type 2 diabetes mellitus with diabetic neuropathy, unspecified: Secondary | ICD-10-CM

## 2018-12-21 DIAGNOSIS — I5022 Chronic systolic (congestive) heart failure: Secondary | ICD-10-CM

## 2018-12-21 MED ORDER — METFORMIN HCL 1000 MG PO TABS: 1000.0000 mg | ORAL_TABLET | Freq: Two times a day (BID) | ORAL | 1 refills | Status: AC

## 2018-12-21 NOTE — Progress Notes (Signed)
Virtual Visit via Telephone The purpose of this virtual visit is to provide medical care while limiting exposure to the novel coronavirus (COVID19) for both patient and office staff.  Consent was obtained for phone visit:  Yes.   Answered questions that patient had about telehealth interaction:  Yes.   I discussed the limitations, risks, security and privacy concerns of performing an evaluation and management service by telephone. I also discussed with the patient that there may be a patient responsible charge related to this service. The patient expressed understanding and agreed to proceed.  Patient Location: passenger in car - not driving Provider Location: [facility])  ---------------------------------------------------------------------- Chief Complaint  Patient presents with  . Diabetes    pt recently had a cardiac surgery 4/15, 4/24. Pt blood sugar 170- 200' range   . Coronary Artery Disease  . Congestive Heart Failure    S: Reviewed CMA documentation. I have called patient and gathered additional HPI as follows:  Ischemic Cardiomyopathy, due to Coronary Artery Disease (CAD) s/p PCI Systolic Congestive Heart Failure, reduced Ejection Fraction - Last visit with me 11/03/18, for initial visit for similar problems after hospitalized back in March 2020 starting on 10/25/18, see prior notes for background information - Interval update with patient followed with Cardiologist closely Dr Humphrey Rolls had further testing and referred to [facility]iology, ultimately had cardiac catheterization, determined to have CAD with lesions, require treatment, she had x 2 procedures on 12/08/18 and 12/17/18 with Cardiac Cath w/ PCI and stent placement - Details from [facility] and discharge summary show - CAD s/p PCI w/ DES x 2 placed to RCA on 12/17/18, previous recent PCI of LAD lesion DES placed on 12/08/18, he was discharged on plavix, aspirin, statin - Testing in hospital showed HFrEF  TTE 10/2018 - with reduced EF down to 20%, with dilated LV, to be discharged on entrestro outpatient, resumed furosemide '20mg'$  daily  Today patient reports that overall her heart condition from Dr Humphrey Rolls recent update, has shown some improvement in ejection fracture up to approx 25%, but still reduced overall. Previous as low as 16-20% in hospital prior to procedures. [facility] Cardiology f/u - telephone from Dr Marylen Ponto tomorrow 12/22/18, then in person face to face apt with Cardiology 01/19/19  Regarding her current heart condition - and her out of work paperwork - patient was advised by cardiology Dr Humphrey Rolls, that she should NOT return to work in the community due to her current health of her heart, with ischemic cardiomyopathy and reduced heart function w/ CAD. She is very high risk of complications that could arise if exposed to coronavirus, as this has been shown to cause cardiovascular complication such as heart attack or stroke.  Patient is currently out of work since 10/25/18 after initial hospitalization. She works as an Astronomer.  She has sent [place] paperwork today for FMLA and for Disability Eligibility  -------------------------------  Additional concerns: - Elevated AKi vs CKD - had elevated Cr up to 1.3, down to Cr 1.05, and normalized on discharge  - Type 2 Diabetes: History of hyperglycemia gradually improving back into 180-200s, previously in 300s, Last A1c 6.6 (10/25/18) - on Ozempic '1mg'$  weekly, Basaglar 30u daily, and was on Metformin '1000mg'$  BID but this was held due to cardiology risk of CHF, however her [facility] Cardiologist advised her to RESTART Metformin due to hyperglycemia CBG 300+ - Needs rx metformin on file, otherwise, has Ozempic and Basaglar currently  Denies any high risk travel to  areas of current concern for COVID19. Denies any known or suspected exposure to person with or possibly with COVID19.  Denies any fevers, chills, sweats, body ache, cough,  shortness of breath, sinus pain or pressure, headache, abdominal pain, diarrhea, active chest pain, edema  Past Medical History:  Diagnosis Date  . Anemia   . Arthritis   . Complication of anesthesia 12/13/2015   aspiration after breathing tube removed after several surgeries  . Dyspnea   . GERD (gastroesophageal reflux disease)   . Hyperlipidemia   . Hypertension   . Infection 2012   right knee infection, required arthroscopy, long term antibiotic treatment, aspiration therapy   Social History   Tobacco Use  . Smoking status: Never Smoker  . Smokeless tobacco: Never Used  Substance Use Topics  . Alcohol use: No    Comment: rare  . Drug use: No    Current Outpatient Medications:  .  aspirin EC 81 MG tablet, Take 81 mg by mouth daily. , Disp: , Rfl:  .  atorvastatin (LIPITOR) 80 MG tablet, Take 80 mg by mouth daily., Disp: , Rfl:  .  blood glucose meter kit and supplies KIT, Dispense based on patient and insurance preference. Use up to four times daily as directed. (FOR ICD-9 250.00, 250.01)., Disp: 1 each, Rfl: 0 .  carvedilol (COREG) 25 MG tablet, Take 25 mg by mouth 2 (two) times daily., Disp: , Rfl:  .  digoxin (LANOXIN) 0.125 MG tablet, Take 1 tablet (0.125 mg total) by mouth daily. (Patient taking differently: Take 0.125 mg by mouth 2 (two) times daily. ), Disp: 30 tablet, Rfl: 3 .  docusate sodium (COLACE) 100 MG capsule, Take 100 mg by mouth daily., Disp: , Rfl:  .  famotidine (PEPCID) 20 MG tablet, Take by mouth., Disp: , Rfl:  .  ferrous sulfate 325 (65 FE) MG tablet, Take 325 mg by mouth daily., Disp: , Rfl:  .  FLUoxetine (PROZAC) 40 MG capsule, Take 1 capsule by mouth once daily, Disp: 90 capsule, Rfl: 0 .  furosemide (LASIX) 20 MG tablet, Take 1 tablet (20 mg total) by mouth daily., Disp: 30 tablet, Rfl: 3 .  glucose blood (ONE TOUCH ULTRA TEST) test strip, USE TO CHECK BLOOD SUGAR UP TO 3 TIMES DAILY AS DIRECTED, Disp: 300 each, Rfl: 3 .  Insulin Glargine (BASAGLAR  KWIKPEN) 100 UNIT/ML SOPN, INJECT 20 UNITS SUBCUTANEOUSLY DAILY (Patient taking differently: Inject 30 Units into the skin daily. ), Disp: 15 mL, Rfl: 1 .  Insulin Pen Needle (RELION PEN NEEDLES) 31G X 6 MM MISC, 1 each by Does not apply route daily. Use with [place] insulin as directed., Disp: 100 each, Rfl: 11 .  isosorbide mononitrate (IMDUR) 30 MG 24 hr tablet, Take by mouth., Disp: , Rfl:  .  loratadine (CLARITIN) 10 MG tablet, Take 1 tablet (10 mg total) by mouth daily., Disp: 90 tablet, Rfl: 3 .  omeprazole (PRILOSEC) 20 MG capsule, TAKE 1 CAPSULE BY MOUTH TWICE DAILY BEFORE A MEAL, Disp: 180 capsule, Rfl: 0 .  ONETOUCH DELICA LANCETS 92E MISC, USE TO CHECK BLOOD GLUCOSE UP TO FOUR TIMES DAILY AS DIRECTED (Patient taking differently: Inject 1 Device into the skin 4 (four) times daily. ), Disp: 100 each, Rfl: 0 .  sacubitril-valsartan (ENTRESTO) 24-26 MG, Take 1 tablet by mouth 2 (two) times daily., Disp: 60 tablet, Rfl: 3 .  Semaglutide (OZEMPIC) 1 MG/DOSE SOPN, Inject 1 mg into the skin once a week. (Patient taking differently: Inject 1 mg  into the skin every Sunday. ), Disp: 2 pen, Rfl: 11 .  spironolactone (ALDACTONE) 25 MG tablet, Take 1 tablet (25 mg total) by mouth daily., Disp: 30 tablet, Rfl: 3 .  acetaminophen (TYLENOL 8 HOUR ARTHRITIS PAIN) 650 MG CR tablet, Take 650 mg by mouth every 8 (eight) hours as needed for pain., Disp: , Rfl:  .  carvedilol (COREG) 6.25 MG tablet, Take 1 tablet (6.25 mg total) by mouth 2 (two) times daily with a meal. (Patient not taking: Reported on 11/10/2018), Disp: 60 tablet, Rfl: 3 .  metFORMIN (GLUCOPHAGE) 1000 MG tablet, Take 1 tablet (1,000 mg total) by mouth 2 (two) times daily with a meal., Disp: 180 tablet, Rfl: 1  Depression screen [facility] 2/9 11/03/2018 04/28/2018 12/02/2017  Decreased Interest 0 0 0  Down, Depressed, Hopeless 0 0 0  PHQ - 2 Score 0 0 0    No flowsheet data  found.  -------------------------------------------------------------------------- From recent EHR data - not collected today BP 112/73   Ht '5\' 6"'$  (1.676 m)   Wt 187 lb (84.8 kg)   LMP 07/10/2015 (Approximate)   BMI 30.18 kg/m   O: No physical exam performed due to remote telephone encounter.  Chart review of recent hospitalization records and recent physical exam as documented in [place].  Lab results reviewed.  Recent Labs    04/28/18 1156 10/25/18 1411  HGBA1C 6.7* 6.6*    Recent Results (from the past 2160 hour(s))  CBC with Differential     Status: None   Collection Time: 10/25/18  6:53 AM  Result Value Ref Range   WBC 9.1 4.0 - 10.5 K/uL   RBC 4.22 3.87 - 5.11 MIL/uL   Hemoglobin 12.0 12.0 - 15.0 g/dL   HCT 37.2 36.0 - 46.0 %   MCV 88.2 80.0 - 100.0 fL   MCH 28.4 26.0 - 34.0 pg   MCHC 32.3 30.0 - 36.0 g/dL   RDW 13.7 11.5 - 15.5 %   Platelets 387 150 - 400 K/uL   nRBC 0.0 0.0 - 0.2 %   Neutrophils Relative % 66 %   Neutro Abs 6.0 1.7 - 7.7 K/uL   Lymphocytes Relative 23 %   Lymphs Abs 2.1 0.7 - 4.0 K/uL   Monocytes Relative 5 %   Monocytes Absolute 0.4 0.1 - 1.0 K/uL   Eosinophils Relative 5 %   Eosinophils Absolute 0.4 0.0 - 0.5 K/uL   Basophils Relative 1 %   Basophils Absolute 0.1 0.0 - 0.1 K/uL   Immature Granulocytes 0 %   Abs Immature Granulocytes 0.04 0.00 - 0.07 K/uL    Comment: Performed at [facility], [address] [place], [place] [address]  Basic metabolic panel     Status: Abnormal   Collection Time: 10/25/18  6:53 AM  Result Value Ref Range   Sodium 140 135 - 145 mmol/L   Potassium 4.6 3.5 - 5.1 mmol/L   Chloride 108 98 - 111 mmol/L   CO2 24 22 - 32 mmol/L   Glucose, Bld 140 (H) 70 - 99 mg/dL   BUN 19 6 - 20 mg/dL   Creatinine, Ser 1.00 0.44 - 1.00 mg/dL   Calcium 9.2 8.9 - 10.3 mg/dL   GFR calc non Af Amer >60 >60 mL/min   GFR calc Af Amer >60 >60 mL/min   Anion gap 8 5 - 15    Comment: Performed at [facility], [address], [place],  [address]  Troponin I - Add-On to  previous collection     Status: None   Collection Time: 10/25/18  6:53 AM  Result Value Ref Range   Troponin I <0.03 <0.03 ng/mL    Comment: Performed at [facility], [address] [place], [place] [address]  Brain natriuretic peptide     Status: Abnormal   Collection Time: 10/25/18  6:53 AM  Result Value Ref Range   B Natriuretic Peptide 766.0 (H) 0.0 - 100.0 pg/mL    Comment: Performed at [facility], [address] [place], [place] [address]  Influenza panel by PCR (type A & B)     Status: None   Collection Time: 10/25/18  8:37 AM  Result Value Ref Range   Influenza A By PCR NEGATIVE NEGATIVE   Influenza B By PCR NEGATIVE NEGATIVE    Comment: (NOTE) The Xpert Xpress Flu assay is intended as an aid in the diagnosis of  influenza and should not be used as a sole basis for treatment.  This  assay is FDA approved for nasopharyngeal swab specimens only. Nasal  washings and aspirates are unacceptable for Xpert Xpress Flu testing. Performed at [facility], [address] [place], [place] [address]   Fibrin derivatives D-Dimer [facility] only)     Status: Abnormal   Collection Time: 10/25/18  8:37 AM  Result Value Ref Range   Fibrin derivatives D-dimer ([facility]) 1,376.81 (H) 0.00 - 499.00 ng/mL (FEU)    Comment: (NOTE) <> Exclusion of Venous Thromboembolism (VTE) - OUTPATIENT ONLY   (Emergency Department or [place])   0-499 ng/ml (FEU): With a low to intermediate pretest probability                      for VTE this test result excludes the diagnosis                      of VTE.   >499 ng/ml (FEU) : VTE not excluded; additional work up for VTE is                      required. <> Testing on Inpatients and Evaluation of Disseminated Intravascular   Coagulation (DIC) Reference Range:   0-499 ng/ml (FEU) Performed at [facility], [address] [place], [place]  [address]   ECHOCARDIOGRAM COMPLETE     Status: None   Collection Time: 10/25/18  1:47 PM  Result Value Ref Range   Weight 3,136 oz   Height 66 in   BP 120/74 mmHg  Procalcitonin - Baseline     Status: None   Collection Time: 10/25/18  2:11 PM  Result Value Ref Range   Procalcitonin <0.10 ng/mL    Comment:        Interpretation: PCT (Procalcitonin) <= 0.5 ng/mL: Systemic infection (sepsis) is not likely. Local bacterial infection is possible. (NOTE)       Sepsis PCT Algorithm           Lower Respiratory Tract                                      Infection PCT Algorithm    ----------------------------     ----------------------------         PCT < 0.25 ng/mL                PCT < 0.10 ng/mL  Strongly encourage             Strongly discourage   discontinuation of antibiotics    initiation of antibiotics    ----------------------------     -----------------------------       PCT 0.25 - 0.50 ng/mL            PCT 0.10 - 0.25 ng/mL               OR       >80% decrease in PCT            Discourage initiation of                                            antibiotics      Encourage discontinuation           of antibiotics    ----------------------------     -----------------------------         PCT >= 0.50 ng/mL              PCT 0.26 - 0.50 ng/mL               AND        <80% decrease in PCT             Encourage initiation of                                             antibiotics       Encourage continuation           of antibiotics    ----------------------------     -----------------------------        PCT >= 0.50 ng/mL                  PCT > 0.50 ng/mL               AND         increase in PCT                  Strongly encourage                                      initiation of antibiotics    Strongly encourage escalation           of antibiotics                                     -----------------------------                                           PCT <= 0.25  ng/mL                                                 OR                                        >  80% decrease in PCT                                     Discontinue / Do not initiate                                             antibiotics Performed at [facility], [address] [place], [place] [address]   Hemoglobin A1c     Status: Abnormal   Collection Time: 10/25/18  2:11 PM  Result Value Ref Range   Hgb A1c MFr Bld 6.6 (H) 4.8 - 5.6 %    Comment: (NOTE) Pre diabetes:          5.7%-6.4% Diabetes:              >6.4% Glycemic control for   <7.0% adults with diabetes    Mean Plasma Glucose 142.72 mg/dL    Comment: Performed at [facility] [address], [place], [place] [address]  HIV antibody (Routine Testing)     Status: None   Collection Time: 10/25/18  2:11 PM  Result Value Ref Range   HIV Screen 4th Generation wRfx Non Reactive Non Reactive    Comment: (NOTE) Performed At: [facility] [address] [place], [place] [id number] Rush Farmer MD XE:[id number]   Glucose, capillary     Status: Abnormal   Collection Time: 10/25/18  5:17 PM  Result Value Ref Range   Glucose-Capillary 266 (H) 70 - 99 mg/dL  Glucose, capillary     Status: Abnormal   Collection Time: 10/25/18  8:52 PM  Result Value Ref Range   Glucose-Capillary 229 (H) 70 - 99 mg/dL  Procalcitonin     Status: None   Collection Time: 10/26/18  4:33 AM  Result Value Ref Range   Procalcitonin <0.10 ng/mL    Comment:        Interpretation: PCT (Procalcitonin) <= 0.5 ng/mL: Systemic infection (sepsis) is not likely. Local bacterial infection is possible. (NOTE)       Sepsis PCT Algorithm           Lower Respiratory Tract                                      Infection PCT Algorithm    ----------------------------     ----------------------------         PCT < 0.25 ng/mL                PCT < 0.10 ng/mL         Strongly encourage             Strongly discourage    discontinuation of antibiotics    initiation of antibiotics    ----------------------------     -----------------------------       PCT 0.25 - 0.50 ng/mL            PCT 0.10 - 0.25 ng/mL               OR       >80% decrease in PCT            Discourage initiation of  antibiotics      Encourage discontinuation           of antibiotics    ----------------------------     -----------------------------         PCT >= 0.50 ng/mL              PCT 0.26 - 0.50 ng/mL               AND        <80% decrease in PCT             Encourage initiation of                                             antibiotics       Encourage continuation           of antibiotics    ----------------------------     -----------------------------        PCT >= 0.50 ng/mL                  PCT > 0.50 ng/mL               AND         increase in PCT                  Strongly encourage                                      initiation of antibiotics    Strongly encourage escalation           of antibiotics                                     -----------------------------                                           PCT <= 0.25 ng/mL                                                 OR                                        > 80% decrease in PCT                                     Discontinue / Do not initiate                                             antibiotics Performed at [facility], [address], [place], [place] [address]   Basic metabolic panel     Status: Abnormal   Collection Time: 10/26/18  4:33 AM  Result Value Ref Range   Sodium 141 135 - 145 mmol/L   Potassium 4.5 3.5 - 5.1 mmol/L   Chloride 108 98 - 111 mmol/L   CO2 24 22 - 32 mmol/L   Glucose, Bld 194 (H) 70 - 99 mg/dL   BUN 31 (H) 6 - 20 mg/dL   Creatinine, Ser 1.36 (H) 0.44 - 1.00 mg/dL   Calcium 8.6 (L) 8.9 - 10.3 mg/dL   GFR calc non Af Amer 44 (L) >60 mL/min   GFR calc Af Amer 51 (L) >60 mL/min    Anion gap 9 5 - 15    Comment: Performed at [facility], [address] [place], [place] [address]  CBC     Status: Abnormal   Collection Time: 10/26/18  4:33 AM  Result Value Ref Range   WBC 11.0 (H) 4.0 - 10.5 K/uL   RBC 3.69 (L) 3.87 - 5.11 MIL/uL   Hemoglobin 10.4 (L) 12.0 - 15.0 g/dL   HCT 32.2 (L) 36.0 - 46.0 %   MCV 87.3 80.0 - 100.0 fL   MCH 28.2 26.0 - 34.0 pg   MCHC 32.3 30.0 - 36.0 g/dL   RDW 13.6 11.5 - 15.5 %   Platelets 364 150 - 400 K/uL   nRBC 0.0 0.0 - 0.2 %    Comment: Performed at [facility] [address] [place], [place] [address]  Glucose, capillary     Status: Abnormal   Collection Time: 10/26/18  8:08 AM  Result Value Ref Range   Glucose-Capillary 105 (H) 70 - 99 mg/dL  Respiratory Panel by PCR     Status: None   Collection Time: 10/26/18 10:00 AM  Result Value Ref Range   Adenovirus NOT DETECTED NOT DETECTED   Coronavirus 229E NOT DETECTED NOT DETECTED    Comment: (NOTE) The Coronavirus on the Respiratory Panel, DOES NOT test for the novel  Coronavirus (2019 nCoV)    Coronavirus HKU1 NOT DETECTED NOT DETECTED   Coronavirus NL63 NOT DETECTED NOT DETECTED   Coronavirus OC43 NOT DETECTED NOT DETECTED   Metapneumovirus NOT DETECTED NOT DETECTED   Rhinovirus / Enterovirus NOT DETECTED NOT DETECTED   Influenza A NOT DETECTED NOT DETECTED   Influenza B NOT DETECTED NOT DETECTED   Parainfluenza Virus 1 NOT DETECTED NOT DETECTED   Parainfluenza Virus 2 NOT DETECTED NOT DETECTED   Parainfluenza Virus 3 NOT DETECTED NOT DETECTED   Parainfluenza Virus 4 NOT DETECTED NOT DETECTED   Respiratory Syncytial Virus NOT DETECTED NOT DETECTED   Bordetella pertussis NOT DETECTED NOT DETECTED   Chlamydophila pneumoniae NOT DETECTED NOT DETECTED   Mycoplasma pneumoniae NOT DETECTED NOT DETECTED    Comment: Performed at [facility], [address] [place], [place] [address]  Glucose, capillary     Status: Abnormal   Collection Time:  10/26/18 11:42 AM  Result Value Ref Range   Glucose-Capillary 240 (H) 70 - 99 mg/dL  Glucose, capillary     Status: None   Collection Time: 10/26/18  4:56 PM  Result Value Ref Range   Glucose-Capillary 77 70 - 99 mg/dL  Glucose, capillary     Status: Abnormal   Collection Time: 10/26/18  9:04 PM  Result Value Ref Range   Glucose-Capillary 168 (H) 70 - 99 mg/dL  Procalcitonin     Status: None   Collection Time: 10/27/18  4:28 AM  Result Value Ref Range   Procalcitonin <0.10 ng/mL    Comment:  Interpretation: PCT (Procalcitonin) <= 0.5 ng/mL: Systemic infection (sepsis) is not likely. Local bacterial infection is possible. (NOTE)       Sepsis PCT Algorithm           Lower Respiratory Tract                                      Infection PCT Algorithm    ----------------------------     ----------------------------         PCT < 0.25 ng/mL                PCT < 0.10 ng/mL         Strongly encourage             Strongly discourage   discontinuation of antibiotics    initiation of antibiotics    ----------------------------     -----------------------------       PCT 0.25 - 0.50 ng/mL            PCT 0.10 - 0.25 ng/mL               OR       >80% decrease in PCT            Discourage initiation of                                            antibiotics      Encourage discontinuation           of antibiotics    ----------------------------     -----------------------------         PCT >= 0.50 ng/mL              PCT 0.26 - 0.50 ng/mL               AND        <80% decrease in PCT             Encourage initiation of                                             antibiotics       Encourage continuation           of antibiotics    ----------------------------     -----------------------------        PCT >= 0.50 ng/mL                  PCT > 0.50 ng/mL               AND         increase in PCT                  Strongly encourage                                      initiation of  antibiotics    Strongly encourage escalation           of antibiotics                                     -----------------------------  PCT <= 0.25 ng/mL                                                 OR                                        > 80% decrease in PCT                                     Discontinue / Do not initiate                                             antibiotics Performed at [facility], [address] [place], [place] [address]   Basic metabolic panel     Status: Abnormal   Collection Time: 10/27/18  4:28 AM  Result Value Ref Range   Sodium 137 135 - 145 mmol/L   Potassium 4.4 3.5 - 5.1 mmol/L   Chloride 102 98 - 111 mmol/L   CO2 27 22 - 32 mmol/L   Glucose, Bld 142 (H) 70 - 99 mg/dL   BUN 28 (H) 6 - 20 mg/dL   Creatinine, Ser 1.37 (H) 0.44 - 1.00 mg/dL   Calcium 9.0 8.9 - 10.3 mg/dL   GFR calc non Af Amer 44 (L) >60 mL/min   GFR calc Af Amer 51 (L) >60 mL/min   Anion gap 8 5 - 15    Comment: Performed at [facility], [address] [place], [place] [address]  Glucose, capillary     Status: Abnormal   Collection Time: 10/27/18  7:51 AM  Result Value Ref Range   Glucose-Capillary 140 (H) 70 - 99 mg/dL  Glucose, capillary     Status: Abnormal   Collection Time: 10/27/18 11:32 AM  Result Value Ref Range   Glucose-Capillary 160 (H) 70 - 99 mg/dL  Glucose, capillary     Status: Abnormal   Collection Time: 10/27/18  4:15 PM  Result Value Ref Range   Glucose-Capillary 192 (H) 70 - 99 mg/dL  Glucose, capillary     Status: Abnormal   Collection Time: 10/27/18  8:49 PM  Result Value Ref Range   Glucose-Capillary 236 (H) 70 - 99 mg/dL   Comment 1 Notify RN   Glucose, capillary     Status: Abnormal   Collection Time: 10/28/18  8:17 AM  Result Value Ref Range   Glucose-Capillary 234 (H) 70 - 99 mg/dL   Comment 1 Notify RN   Glucose, capillary     Status: Abnormal   Collection Time: 10/28/18  12:36 PM  Result Value Ref Range   Glucose-Capillary 163 (H) 70 - 99 mg/dL   Comment 1 Notify RN   Glucose, capillary     Status: Abnormal   Collection Time: 11/11/18  9:00 AM  Result Value Ref Range   Glucose-Capillary 265 (H) 70 - 99 mg/dL  Basic metabolic panel     Status: Abnormal   Collection Time: 11/11/18  9:03 AM  Result Value Ref Range   Sodium 136 135 - 145  mmol/L   Potassium 4.3 3.5 - 5.1 mmol/L   Chloride 102 98 - 111 mmol/L   CO2 26 22 - 32 mmol/L   Glucose, Bld 288 (H) 70 - 99 mg/dL   BUN 25 (H) 6 - 20 mg/dL   Creatinine, Ser 1.38 (H) 0.44 - 1.00 mg/dL   Calcium 9.2 8.9 - 10.3 mg/dL   GFR calc non Af Amer 43 (L) >60 mL/min   GFR calc Af Amer 50 (L) >60 mL/min   Anion gap 8 5 - 15    Comment: Performed at [facility], [address] [place], [place] [address]  CBC     Status: Abnormal   Collection Time: 11/11/18  9:03 AM  Result Value Ref Range   WBC 9.2 4.0 - 10.5 K/uL   RBC 4.21 3.87 - 5.11 MIL/uL   Hemoglobin 11.8 (L) 12.0 - 15.0 g/dL   HCT 36.4 36.0 - 46.0 %   MCV 86.5 80.0 - 100.0 fL   MCH 28.0 26.0 - 34.0 pg   MCHC 32.4 30.0 - 36.0 g/dL   RDW 13.2 11.5 - 15.5 %   Platelets 448 (H) 150 - 400 K/uL   nRBC 0.0 0.0 - 0.2 %    Comment: Performed at [facility], [address] [place], [place] [address]  Protime-INR     Status: None   Collection Time: 11/11/18  9:03 AM  Result Value Ref Range   Prothrombin Time 12.3 11.4 - 15.2 seconds   INR 0.9 0.8 - 1.2    Comment: (NOTE) INR goal varies based on device and disease states. Performed at [facility], [address] [place], [place] [address]   Glucose, capillary     Status: Abnormal   Collection Time: 11/11/18  4:26 PM  Result Value Ref Range   Glucose-Capillary 210 (H) 70 - 99 mg/dL    -------------------------------------------------------------------------- A&P:  Problem List Items Addressed This Visit    CAD (coronary artery disease)   Relevant Medications    atorvastatin (LIPITOR) 80 MG tablet   isosorbide mononitrate (IMDUR) 30 MG 24 hr tablet   Controlled type 2 diabetes mellitus with neuropathy (HCC)   Relevant Medications   atorvastatin (LIPITOR) 80 MG tablet   metFORMIN (GLUCOPHAGE) 1000 MG tablet   Ischemic cardiomyopathy - Primary   Relevant Medications   atorvastatin (LIPITOR) 80 MG tablet   isosorbide mononitrate (IMDUR) 30 MG 24 hr tablet   Systolic CHF (HCC)   Relevant Medications   atorvastatin (LIPITOR) 80 MG tablet   isosorbide mononitrate (IMDUR) 30 MG 24 hr tablet     Clinically with complex cardiovascular case, since onset new problem 10/25/18 with hospitalization with Systolic CHF secondary to Ischemic Cardiomyopathy and CAD, ultimately required close follow-up by local Cardiology Dr Humphrey Rolls and tertiary care center [facility] Cardiology Dr Marylen Ponto for ischemic testing, ECHO, cardiac cath and procedural intervention with x 2 - episodes of PCI via Cath for stent placement and med management.  Currently she has demonstrated improved EF with her systolic CHF EF initially as low as 16-20%, now up to approx 25% by her report on last check up by Cardiology  On med management with Aspirin, Plavix, Atorvastatin, Imdur, Entresto, Spironolactone, Furosemide  She is still limited by her CHF, but overall improving based on Cardiology report.  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 - Cardiology Dr Humphrey Rolls has requested that she remain out of work since  10/25/18 due to her significant cardiac history now - because she would be considered very high risk for cardiovascular complication - if she were to potentially get coronavirus (COVID19) infection  Continue current medication management per Cardiology Follow-up with [facility] Cardiology by phone tomorrow, and in person w/ Dr Marylen Ponto in 1 month  Completed her FMLA and Disability Eligibility paperwork today, based on these  recommendations of her Cardiologist - primarily considered disabled due to unable to go into community/school with high risk of exposure for coronavirus. Additional physical limitations with difficulty with prolonged ambulation, physical exertion lifting.  Additionally - I spoke with Malachy Mood through [facility] - to review the requested paperwork. Finalized paperwork and accompanying medical records to be faxed Wednesday 12/22/18.   Meds ordered this encounter  Medications  . metFORMIN (GLUCOPHAGE) 1000 MG tablet    Sig: Take 1 tablet (1,000 mg total) by mouth 2 (two) times daily with a meal.    Dispense:  180 tablet    Refill:  1    Please keep on file, patient is not ready to pick up, has existing med at home    Follow-up: - Return in 6 wk to 3 months for DM A1c  Patient verbalizes understanding with the above medical recommendations including the limitation of remote medical advice.  Specific follow-up and call-back criteria were given for patient to follow-up or seek medical care more urgently if needed.   - Time spent in direct consultation with patient on phone: 30 minutes  Nobie Putnam, [facility] 12/21/2018, 2:12 PM

## 2018-12-21 NOTE — Patient Instructions (Signed)
No access to MyChart. AVS info given by phone.  Please schedule a Follow-up Appointment to: Return in about 6 weeks (around 02/01/2019) for DM A1c.  If you have any other questions or concerns, please feel free to call the office or send a message through MyChart. You may also schedule an earlier appointment if necessary.  Additionally, you may be receiving a survey about your experience at our office within a few days to 1 week by e-mail or mail. We value your feedback.  Saralyn Pilar, DO [facility], [place]

## 2018-12-25 ENCOUNTER — Other Ambulatory Visit: Payer: Self-pay | Admitting: Family Medicine

## 2018-12-25 DIAGNOSIS — K219 Gastro-esophageal reflux disease without esophagitis: Secondary | ICD-10-CM

## 2018-12-25 DIAGNOSIS — F419 Anxiety disorder, unspecified: Secondary | ICD-10-CM

## 2019-02-11 ENCOUNTER — Encounter: Payer: Self-pay | Admitting: Family Medicine

## 2019-02-11 NOTE — Progress Notes (Signed)
Notified by after hours on call message / fax from covering provider at time of vent. Reviewed chart. Reviewed documentation from responders/police at time of death, manner of patient's passing reported to be cardiac arrest. Reported by Herbert Pun, time of death 18:39 approx, not reviewed by medical examiner, PCP accepted to sign death cert.  See summary below of completed information on Death Certificate, completed and signed.  Part I, Immediate cause of death:  (approx interval onset)         a) Acute Myocardial Infarction (1 day)         b) Atherosclerotic Coronary Artery Disease (3-5 years) Part II, other significant contributing diseases: Ischemic Cardiomyopathy, Systolic Heart Failure, Hyperlipidemia, Type 2 Diabetes, Hypertension No autopsy performed. Manner of Death: Natural Case was not referred to Medical examiner. Time of Death: 8003 (approx) / Date 26-Feb-2019 Did tobacco contribute to death - No  Completed original death certificate returned to [facility] ph [phone] for processing on 02/11/19.  Nobie Putnam, [facility] 02/11/2019, 12:17 PM

## 2019-02-23 DEATH — deceased

## 2020-07-08 IMAGING — CT CT ANGIO CHEST
2 of 6 series · 18 of 46 positions shown · IV contrast (APPLIED)
Comparison: Chest x-ray from earlier in the same day.

CLINICAL DATA: Dyspnea on exertion

EXAM:
CT ANGIOGRAPHY CHEST WITH CONTRAST
TECHNIQUE: Multidetector CT imaging of the chest was performed using the
standard protocol during bolus administration of intravenous
contrast. Multiplanar CT image reconstructions and MIPs were
obtained to evaluate the vascular anatomy.
CONTRAST:  75mL OMNIPAQUE IOHEXOL 350 MG/ML SOLN

[Series 5: thins · axial · 0.80mm/px · z∈[-161,+86]mm · 15 of 271 slices shown]
[im 12/271  lung]
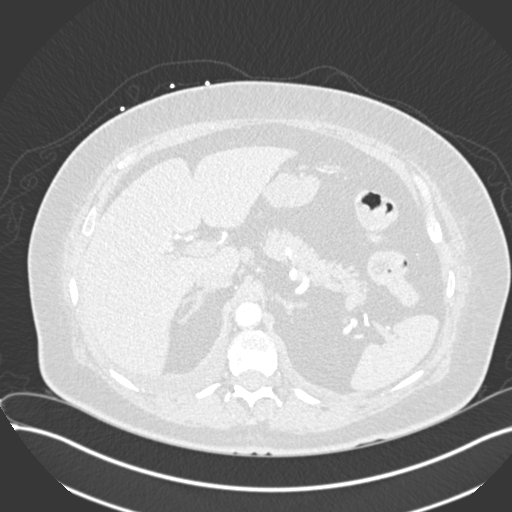
[im 36/271  soft-tissue]
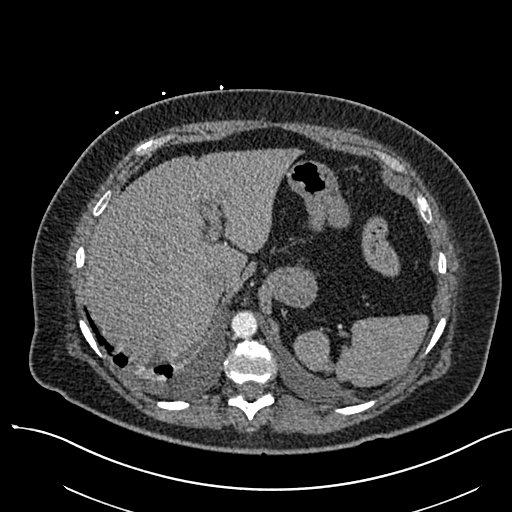
[im 47/271  lung]
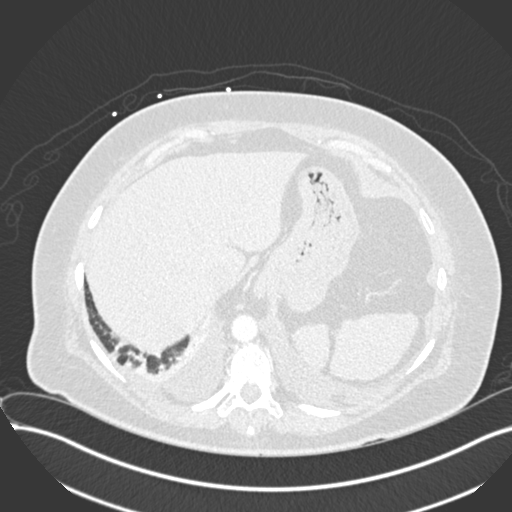
[im 71/271  soft-tissue]
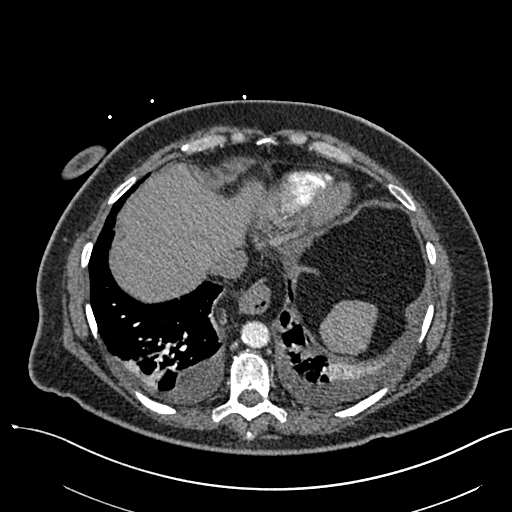
[im 83/271  lung]
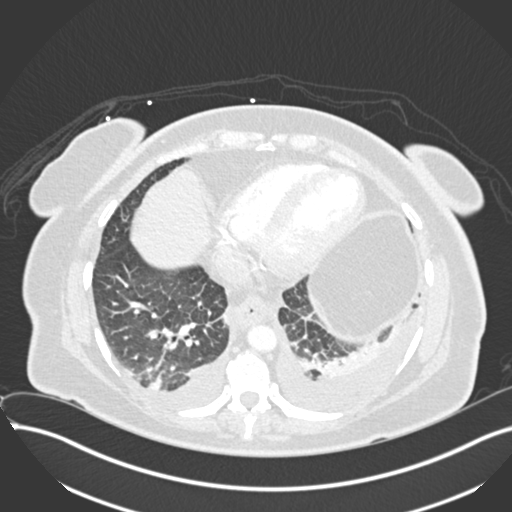
[im 106/271  soft-tissue]
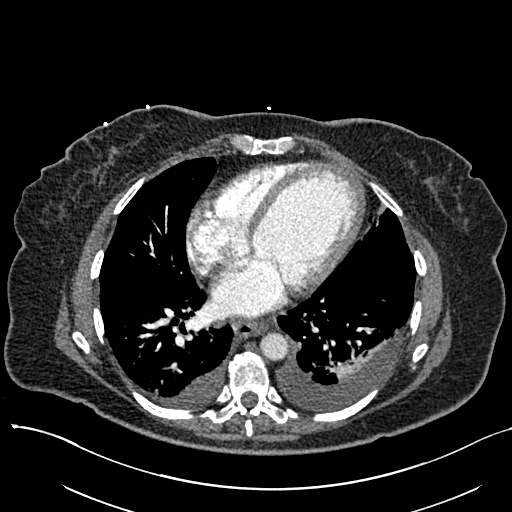
[im 118/271  lung]
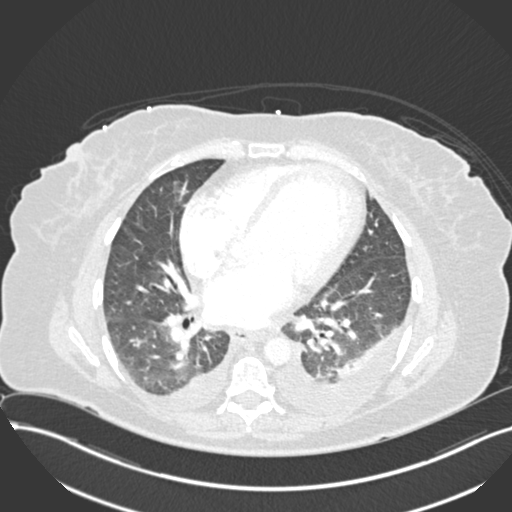
[im 141/271  soft-tissue]
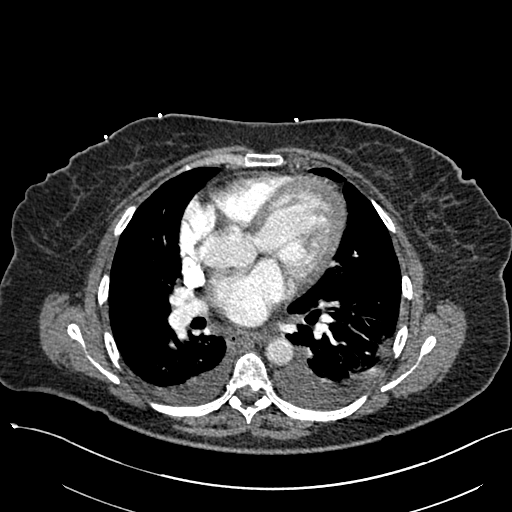
[im 153/271  lung]
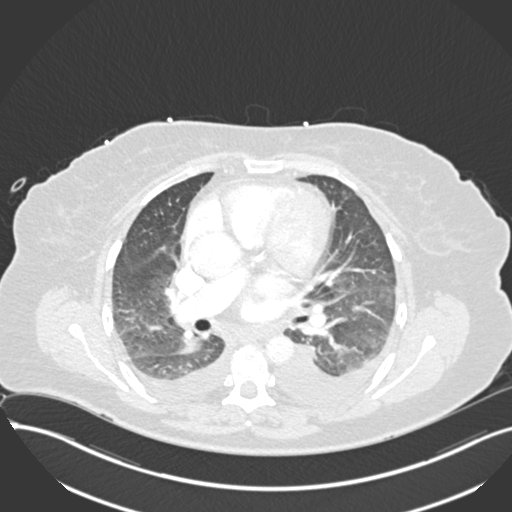
[im 165/271  soft-tissue]
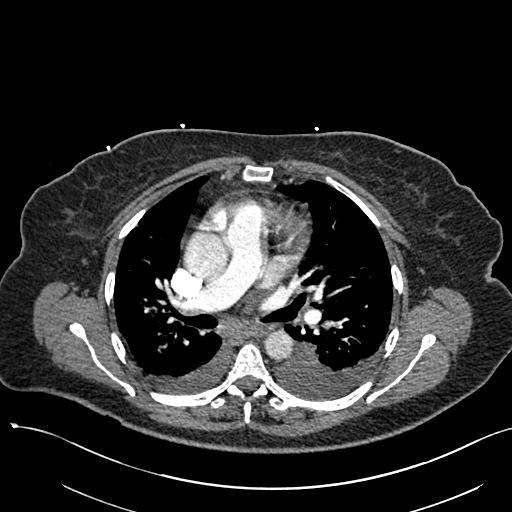
[im 188/271  lung]
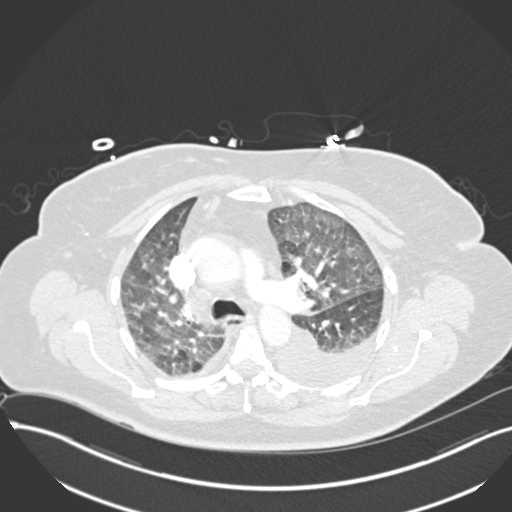
[im 200/271  soft-tissue]
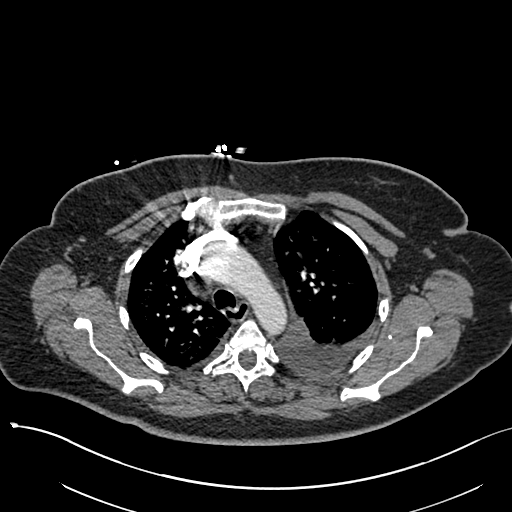
[im 224/271  lung]
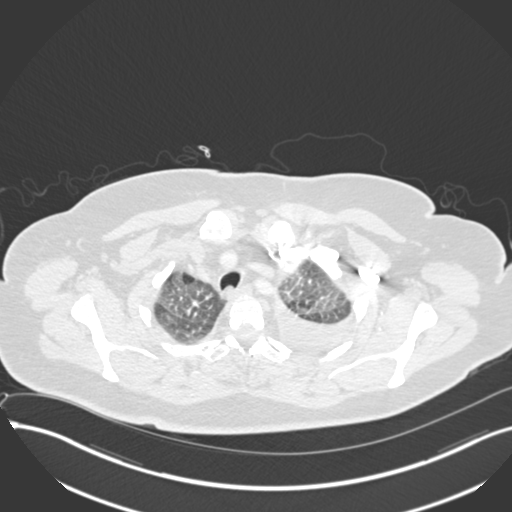
[im 235/271  soft-tissue]
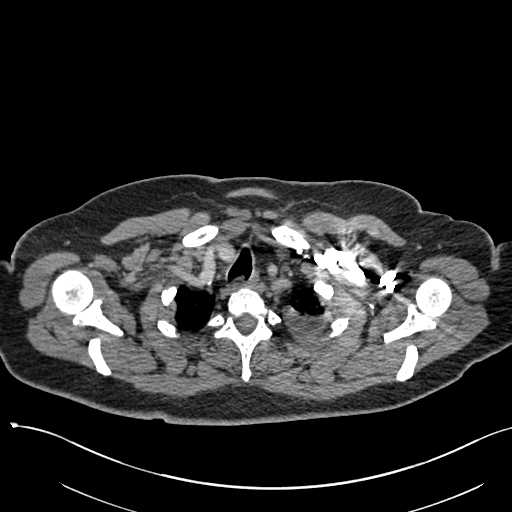
[im 259/271  lung]
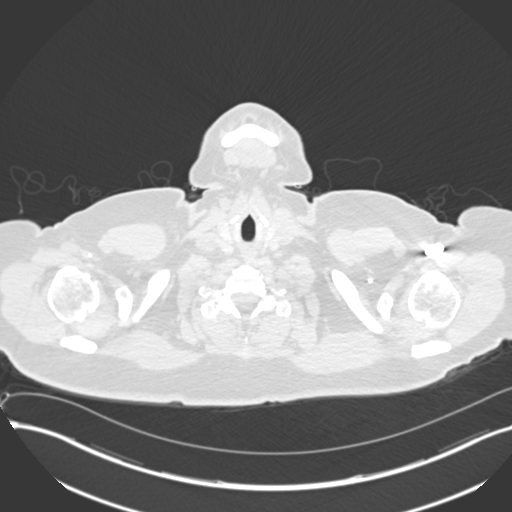

[Series 7: coronal mpr · coronal · 0.53mm/px · 3 of 95 slices shown]
[im 24/95  soft-tissue]
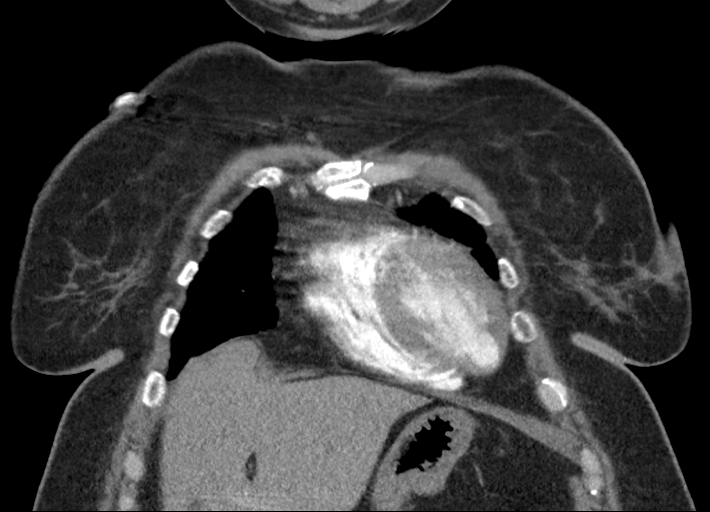
[im 48/95  soft-tissue]
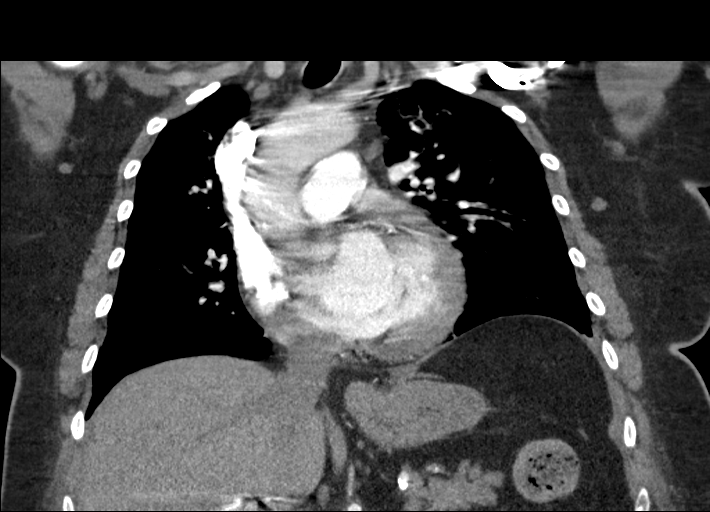
[im 71/95  soft-tissue]
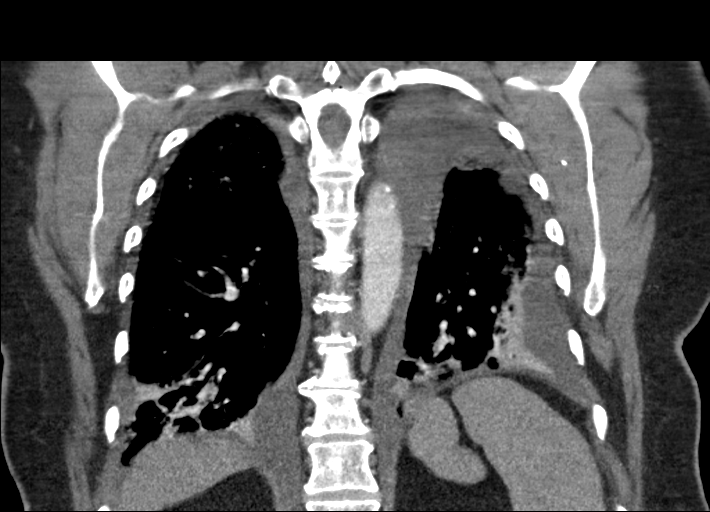

[18 of 46 positions shown; findings below may reference images not displayed]

FINDINGS: Cardiovascular: Thoracic aorta demonstrates mild atherosclerotic
calcifications. No aneurysmal dilatation is seen. No cardiac
enlargement is noted. Coronary calcifications are seen. The
pulmonary artery demonstrates a normal branching pattern without
evidence of focal filling defects to suggest pulmonary embolism.

Mediastinum/Nodes: Thoracic inlet is within normal limits. No hilar
or mediastinal adenopathy is noted. Esophagus is within normal
limits. Small sliding-type hiatal hernia is noted.

Lungs/Pleura: Bilateral pleural effusions are noted left greater
than right. Bilateral atelectasis/early infiltrate is noted in the
lower lobes. Mild changes of interstitial edema are noted throughout
both lungs similar to that seen on the prior exam.

Upper Abdomen: Postsurgical changes consistent with prior
cholecystectomy noted. Hiatal hernia is again seen.

Musculoskeletal: Degenerative changes of the thoracic spine are
noted. No acute bony abnormality is seen.

Review of the MIP images confirms the above findings.
IMPRESSION: Changes of CHF with interstitial edema and pleural effusions.

Bibasilar atelectasis/early infiltrate.

No evidence of pulmonary emboli.

## 2020-07-08 IMAGING — CR DG CHEST 2V
2 series · 2 of 2 positions shown · non-contrast
Comparison: 02/02/2017

CLINICAL DATA: Headache 3 days with difficulty breathing.

EXAM:
CHEST - 2 VIEW

[chest pa]
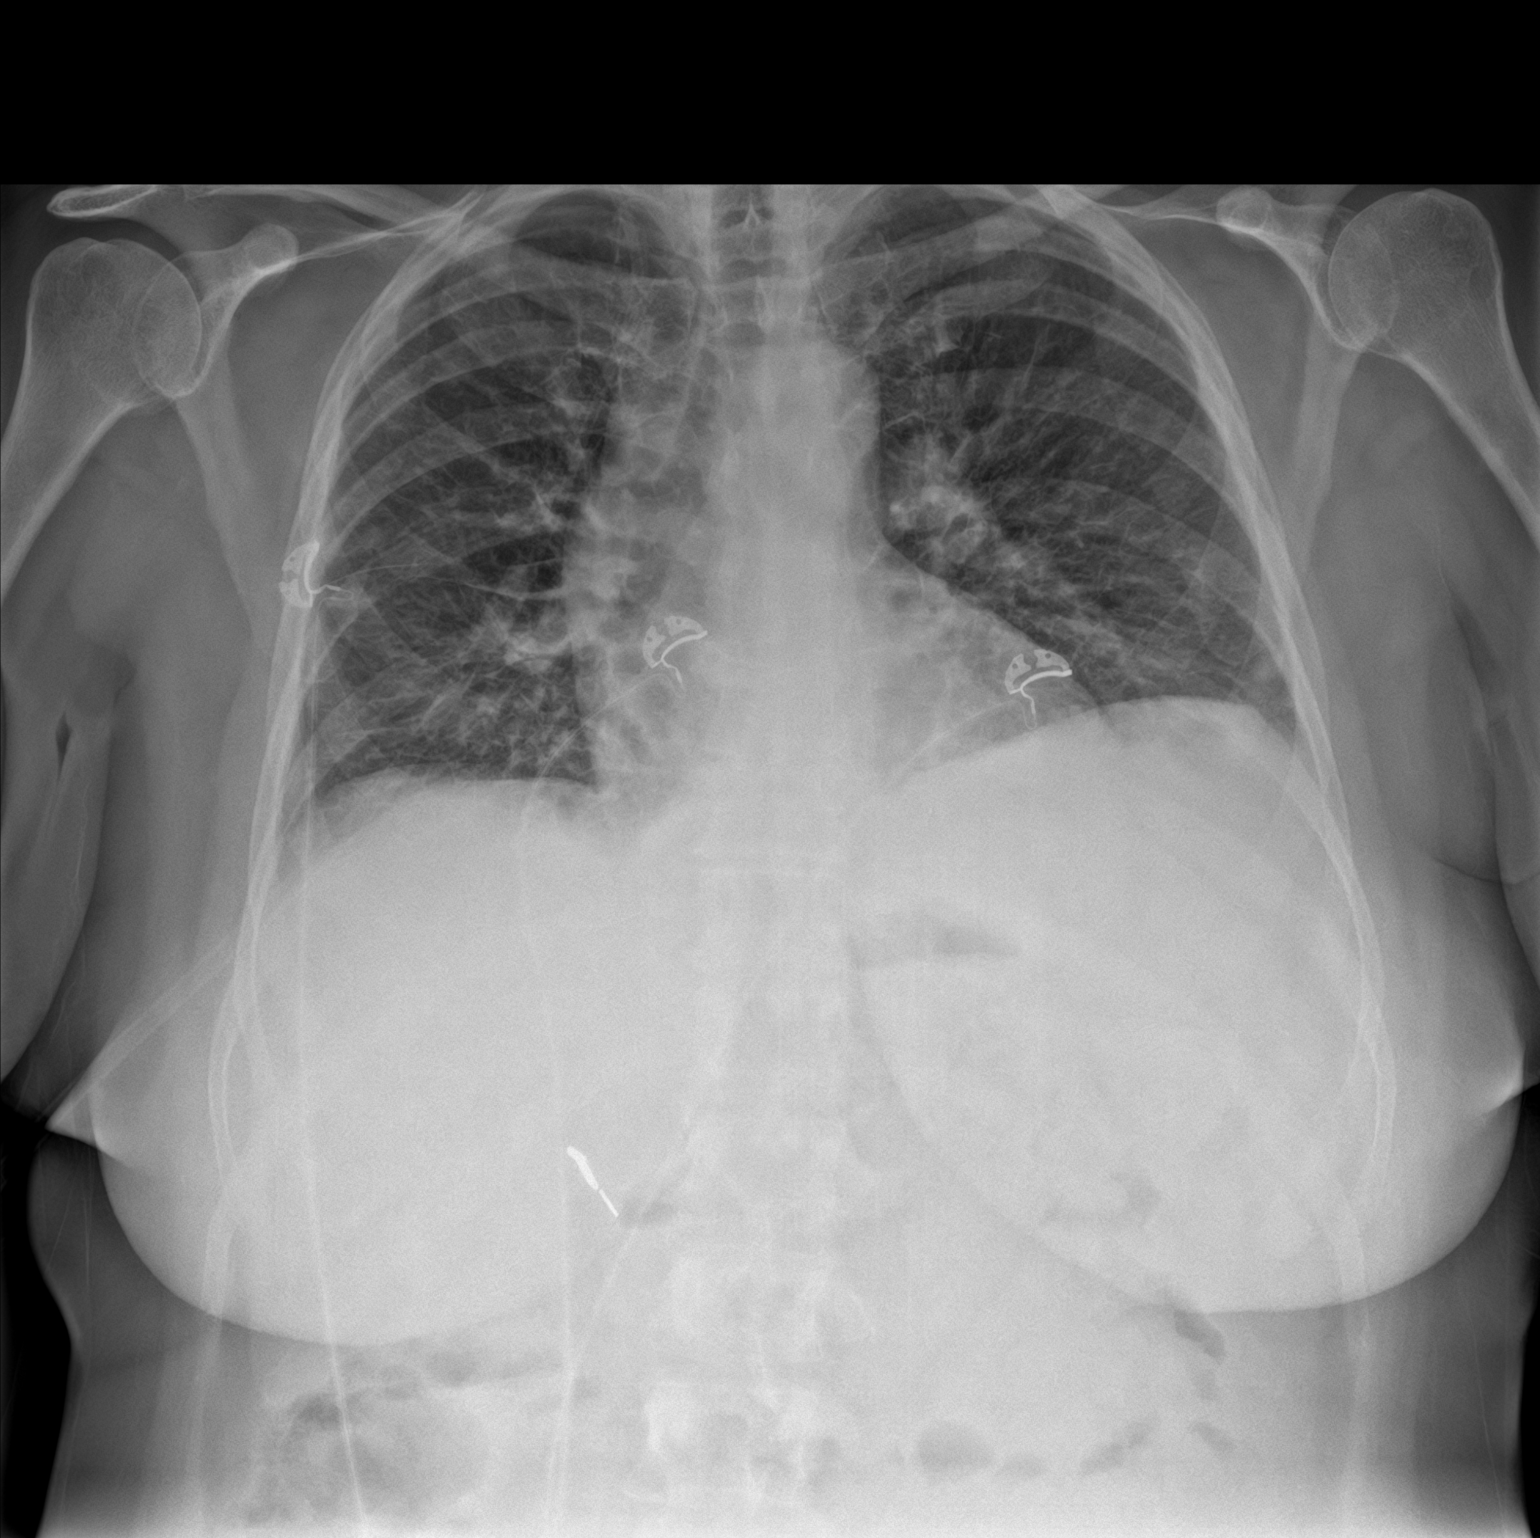

[chest lat]
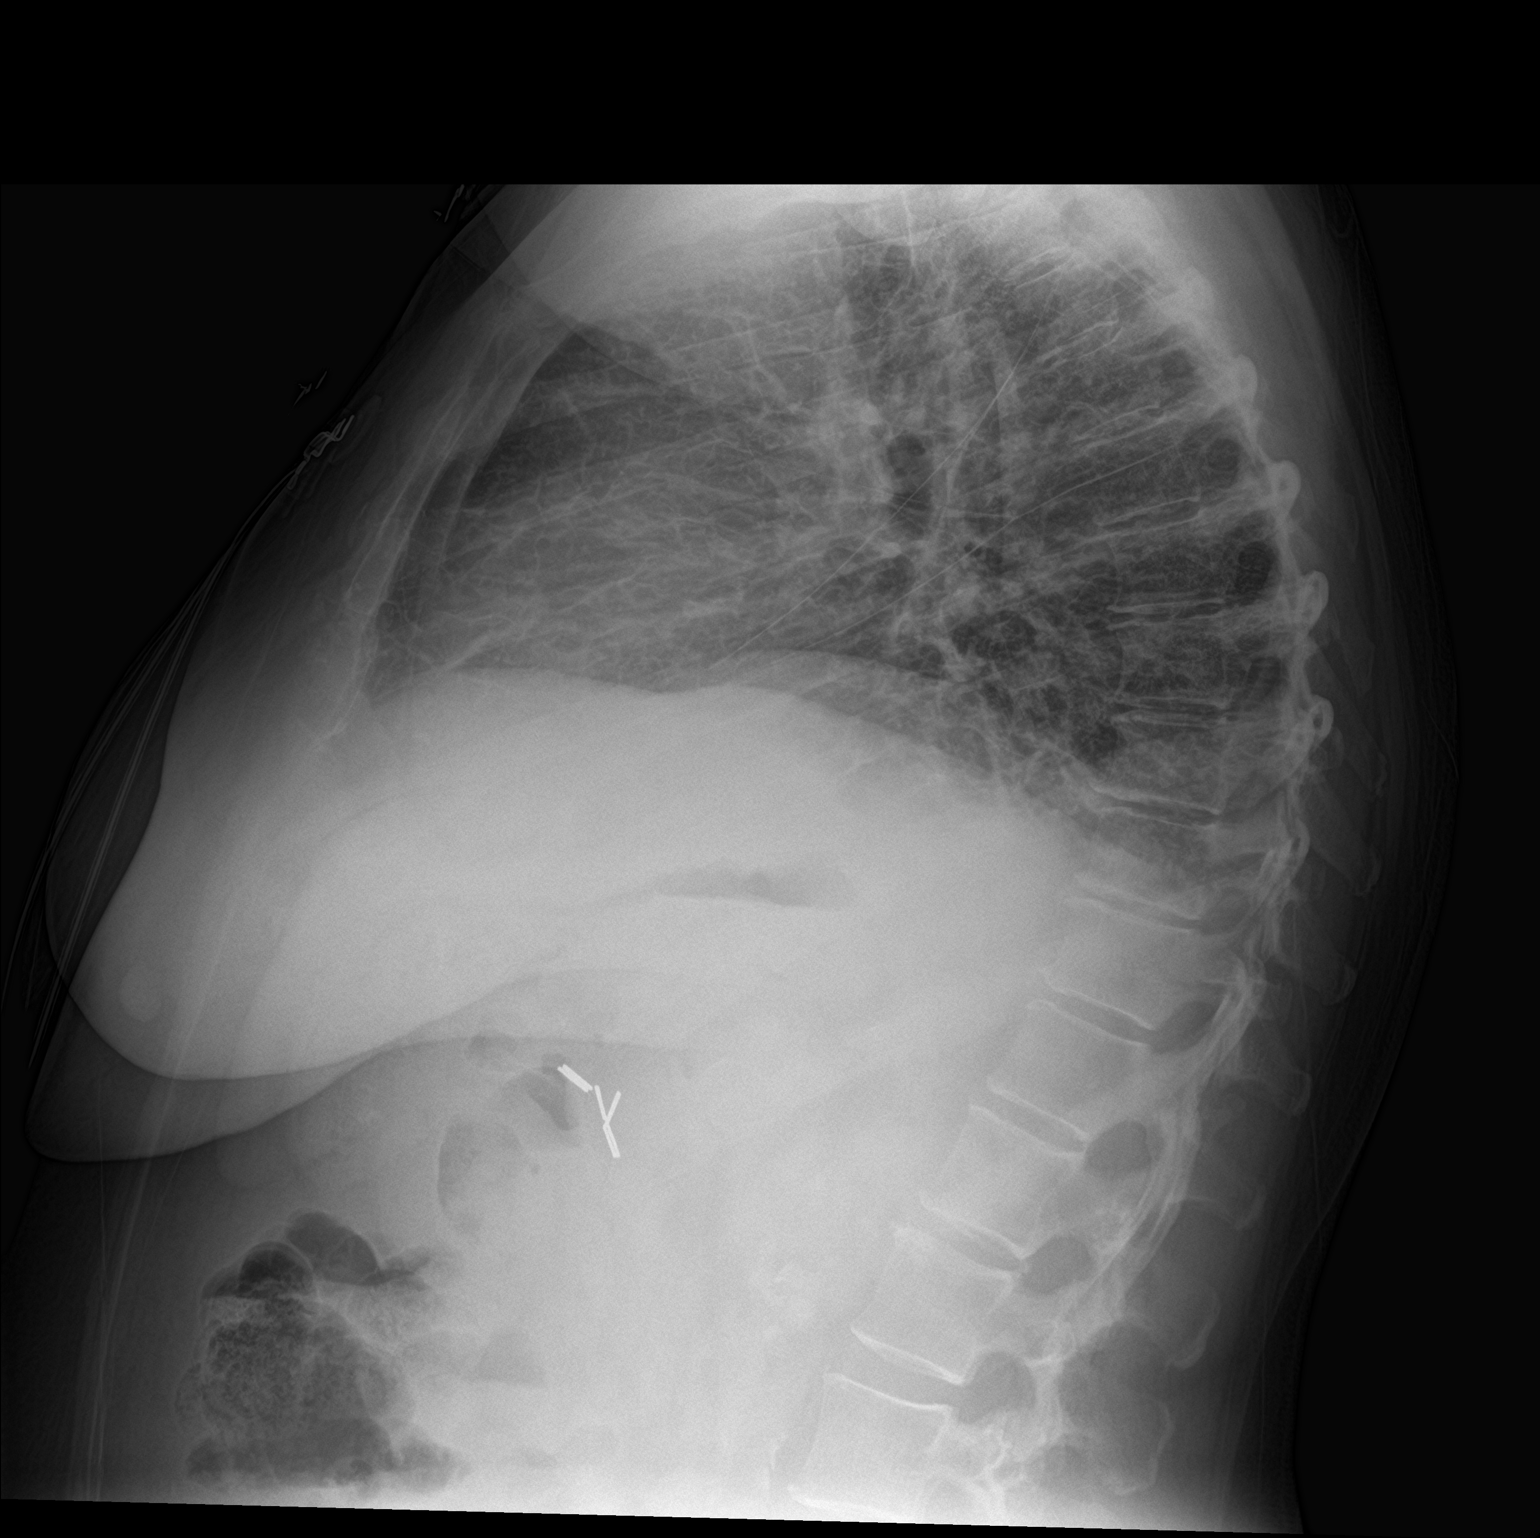

[2 of 2 positions shown; findings below may reference images not displayed]

FINDINGS: Lungs are hypoinflated demonstrate bilateral central coarse
interstitial changes. Small amount of posterior left pleural fluid
demonstrated. Cardiomediastinal silhouette and remainder of the exam
is unremarkable.
IMPRESSION: Hypoinflation with coarse central interstitial changes suggesting
interstitial edema versus acute bronchitic process. Small amount of
posterior left pleural fluid.
